# Patient Record
Sex: Female | Born: 1935
Health system: Southern US, Community
[De-identification: ages and names within clinical notes are randomized; demographics above are authoritative.]

## PROBLEM LIST (undated history)

## (undated) DIAGNOSIS — E669 Obesity, unspecified: Secondary | ICD-10-CM

## (undated) DIAGNOSIS — Z780 Asymptomatic menopausal state: Secondary | ICD-10-CM

## (undated) DIAGNOSIS — E785 Hyperlipidemia, unspecified: Secondary | ICD-10-CM

## (undated) DIAGNOSIS — K449 Diaphragmatic hernia without obstruction or gangrene: Secondary | ICD-10-CM

## (undated) DIAGNOSIS — I639 Cerebral infarction, unspecified: Secondary | ICD-10-CM

## (undated) DIAGNOSIS — M1990 Unspecified osteoarthritis, unspecified site: Secondary | ICD-10-CM

## (undated) DIAGNOSIS — T7840XA Allergy, unspecified, initial encounter: Secondary | ICD-10-CM

## (undated) DIAGNOSIS — I82409 Acute embolism and thrombosis of unspecified deep veins of unspecified lower extremity: Secondary | ICD-10-CM

## (undated) DIAGNOSIS — H269 Unspecified cataract: Secondary | ICD-10-CM

## (undated) DIAGNOSIS — E010 Iodine-deficiency related diffuse (endemic) goiter: Secondary | ICD-10-CM

## (undated) DIAGNOSIS — I10 Essential (primary) hypertension: Secondary | ICD-10-CM

## (undated) HISTORY — DX: Cerebral infarction, unspecified: I63.9

## (undated) HISTORY — DX: Unspecified osteoarthritis, unspecified site: M19.90

## (undated) HISTORY — PX: EYE SURGERY: SHX253

## (undated) HISTORY — DX: Unspecified cataract: H26.9

## (undated) HISTORY — DX: Obesity, unspecified: E66.9

## (undated) HISTORY — DX: Acute embolism and thrombosis of unspecified deep veins of unspecified lower extremity: I82.409

## (undated) HISTORY — PX: SHOULDER SURGERY: SHX246

## (undated) HISTORY — PX: ABDOMINAL HYSTERECTOMY: SHX81

## (undated) HISTORY — DX: Diaphragmatic hernia without obstruction or gangrene: K44.9

## (undated) HISTORY — DX: Asymptomatic menopausal state: Z78.0

## (undated) HISTORY — DX: Allergy, unspecified, initial encounter: T78.40XA

## (undated) HISTORY — DX: Hyperlipidemia, unspecified: E78.5

## (undated) HISTORY — DX: Iodine-deficiency related diffuse (endemic) goiter: E01.0

## (undated) HISTORY — PX: OTHER SURGICAL HISTORY: SHX169

## (undated) HISTORY — DX: Essential (primary) hypertension: I10

---

## 1995-04-08 DIAGNOSIS — N390 Urinary tract infection, site not specified: Secondary | ICD-10-CM | POA: Insufficient documentation

## 1998-02-05 DIAGNOSIS — M6740 Ganglion, unspecified site: Secondary | ICD-10-CM | POA: Insufficient documentation

## 2000-01-10 ENCOUNTER — Encounter: Admission: RE | Admit: 2000-01-10 | Discharge: 2000-01-10 | Payer: Self-pay | Admitting: Family Medicine

## 2000-01-10 ENCOUNTER — Encounter: Payer: Self-pay | Admitting: Family Medicine

## 2000-06-07 ENCOUNTER — Encounter: Admission: RE | Admit: 2000-06-07 | Discharge: 2000-06-07 | Payer: Self-pay | Admitting: Family Medicine

## 2000-06-07 ENCOUNTER — Encounter: Payer: Self-pay | Admitting: Family Medicine

## 2001-12-29 ENCOUNTER — Encounter: Admission: RE | Admit: 2001-12-29 | Discharge: 2001-12-29 | Payer: Self-pay | Admitting: Family Medicine

## 2001-12-29 ENCOUNTER — Encounter: Payer: Self-pay | Admitting: Family Medicine

## 2002-07-18 ENCOUNTER — Emergency Department (HOSPITAL_COMMUNITY): Admission: EM | Admit: 2002-07-18 | Discharge: 2002-07-18 | Payer: Self-pay | Admitting: Emergency Medicine

## 2002-07-18 ENCOUNTER — Encounter: Payer: Self-pay | Admitting: Emergency Medicine

## 2002-10-08 HISTORY — PX: COLONOSCOPY: SHX174

## 2003-03-23 ENCOUNTER — Encounter: Payer: Self-pay | Admitting: Family Medicine

## 2003-03-23 ENCOUNTER — Encounter: Admission: RE | Admit: 2003-03-23 | Discharge: 2003-03-23 | Payer: Self-pay | Admitting: Family Medicine

## 2003-07-12 ENCOUNTER — Ambulatory Visit (HOSPITAL_COMMUNITY): Admission: RE | Admit: 2003-07-12 | Discharge: 2003-07-12 | Payer: Self-pay | Admitting: Gastroenterology

## 2004-02-20 ENCOUNTER — Emergency Department (HOSPITAL_COMMUNITY): Admission: EM | Admit: 2004-02-20 | Discharge: 2004-02-20 | Payer: Self-pay | Admitting: Internal Medicine

## 2004-03-21 ENCOUNTER — Emergency Department (HOSPITAL_COMMUNITY): Admission: EM | Admit: 2004-03-21 | Discharge: 2004-03-21 | Payer: Self-pay | Admitting: Family Medicine

## 2004-04-14 ENCOUNTER — Encounter: Admission: RE | Admit: 2004-04-14 | Discharge: 2004-04-14 | Payer: Self-pay | Admitting: Family Medicine

## 2005-02-05 ENCOUNTER — Encounter: Admission: RE | Admit: 2005-02-05 | Discharge: 2005-05-06 | Payer: Self-pay | Admitting: Family Medicine

## 2005-04-23 ENCOUNTER — Encounter: Admission: RE | Admit: 2005-04-23 | Discharge: 2005-04-23 | Payer: Self-pay | Admitting: Family Medicine

## 2006-03-20 ENCOUNTER — Ambulatory Visit (HOSPITAL_COMMUNITY): Admission: RE | Admit: 2006-03-20 | Discharge: 2006-03-20 | Payer: Self-pay | Admitting: Orthopedic Surgery

## 2006-05-10 ENCOUNTER — Encounter: Admission: RE | Admit: 2006-05-10 | Discharge: 2006-05-10 | Payer: Self-pay | Admitting: Family Medicine

## 2006-05-13 ENCOUNTER — Ambulatory Visit: Payer: Self-pay | Admitting: Family Medicine

## 2006-07-29 ENCOUNTER — Ambulatory Visit: Payer: Self-pay | Admitting: Family Medicine

## 2006-09-10 ENCOUNTER — Ambulatory Visit: Payer: Self-pay | Admitting: Family Medicine

## 2006-12-04 ENCOUNTER — Ambulatory Visit: Payer: Self-pay | Admitting: Family Medicine

## 2006-12-11 ENCOUNTER — Ambulatory Visit: Payer: Self-pay | Admitting: Family Medicine

## 2007-05-13 ENCOUNTER — Ambulatory Visit: Payer: Self-pay | Admitting: Family Medicine

## 2007-05-16 ENCOUNTER — Encounter: Admission: RE | Admit: 2007-05-16 | Discharge: 2007-05-16 | Payer: Self-pay | Admitting: Family Medicine

## 2007-05-28 ENCOUNTER — Ambulatory Visit: Payer: Self-pay | Admitting: Family Medicine

## 2007-09-16 ENCOUNTER — Ambulatory Visit: Payer: Self-pay | Admitting: Family Medicine

## 2007-11-24 ENCOUNTER — Ambulatory Visit: Payer: Self-pay | Admitting: Family Medicine

## 2008-01-22 ENCOUNTER — Ambulatory Visit: Payer: Self-pay | Admitting: Family Medicine

## 2008-01-23 ENCOUNTER — Encounter: Admission: RE | Admit: 2008-01-23 | Discharge: 2008-01-23 | Payer: Self-pay | Admitting: Family Medicine

## 2008-05-17 ENCOUNTER — Encounter: Admission: RE | Admit: 2008-05-17 | Discharge: 2008-05-17 | Payer: Self-pay | Admitting: Family Medicine

## 2008-05-28 ENCOUNTER — Ambulatory Visit: Payer: Self-pay | Admitting: Family Medicine

## 2008-06-15 ENCOUNTER — Ambulatory Visit: Payer: Self-pay | Admitting: Family Medicine

## 2008-07-07 ENCOUNTER — Ambulatory Visit: Payer: Self-pay | Admitting: Family Medicine

## 2009-01-10 ENCOUNTER — Ambulatory Visit: Payer: Self-pay | Admitting: Family Medicine

## 2009-01-20 ENCOUNTER — Ambulatory Visit: Payer: Self-pay | Admitting: Family Medicine

## 2009-02-03 ENCOUNTER — Ambulatory Visit: Payer: Self-pay | Admitting: Family Medicine

## 2009-02-08 ENCOUNTER — Ambulatory Visit: Payer: Self-pay | Admitting: Family Medicine

## 2009-02-11 ENCOUNTER — Ambulatory Visit: Payer: Self-pay | Admitting: Vascular Surgery

## 2009-03-25 ENCOUNTER — Encounter: Admission: RE | Admit: 2009-03-25 | Discharge: 2009-03-25 | Payer: Self-pay | Admitting: Family Medicine

## 2009-03-25 ENCOUNTER — Ambulatory Visit: Payer: Self-pay | Admitting: Family Medicine

## 2009-03-29 ENCOUNTER — Ambulatory Visit: Payer: Self-pay | Admitting: Family Medicine

## 2009-04-02 ENCOUNTER — Encounter: Admission: RE | Admit: 2009-04-02 | Discharge: 2009-04-02 | Payer: Self-pay | Admitting: Family Medicine

## 2009-05-19 ENCOUNTER — Encounter: Admission: RE | Admit: 2009-05-19 | Discharge: 2009-05-19 | Payer: Self-pay | Admitting: Family Medicine

## 2009-05-19 LAB — HM MAMMOGRAPHY: HM Mammogram: NEGATIVE

## 2009-06-29 ENCOUNTER — Ambulatory Visit: Payer: Self-pay | Admitting: Family Medicine

## 2009-08-09 ENCOUNTER — Ambulatory Visit: Payer: Self-pay | Admitting: Family Medicine

## 2009-11-19 ENCOUNTER — Emergency Department (HOSPITAL_COMMUNITY): Admission: EM | Admit: 2009-11-19 | Discharge: 2009-11-19 | Payer: Self-pay | Admitting: Family Medicine

## 2010-01-20 ENCOUNTER — Ambulatory Visit: Payer: Self-pay | Admitting: Family Medicine

## 2010-04-25 ENCOUNTER — Ambulatory Visit: Payer: Self-pay | Admitting: Family Medicine

## 2010-05-24 ENCOUNTER — Encounter: Admission: RE | Admit: 2010-05-24 | Discharge: 2010-05-24 | Payer: Self-pay | Admitting: Family Medicine

## 2010-07-05 ENCOUNTER — Ambulatory Visit: Payer: Self-pay | Admitting: Family Medicine

## 2011-01-24 ENCOUNTER — Other Ambulatory Visit: Payer: Self-pay | Admitting: Family Medicine

## 2011-01-24 ENCOUNTER — Ambulatory Visit (INDEPENDENT_AMBULATORY_CARE_PROVIDER_SITE_OTHER): Payer: BC Managed Care – PPO | Admitting: Family Medicine

## 2011-01-24 ENCOUNTER — Ambulatory Visit
Admission: RE | Admit: 2011-01-24 | Discharge: 2011-01-24 | Disposition: A | Discharge status: Home / Self Care | Payer: BC Managed Care – PPO | Source: Ambulatory Visit | Attending: Family Medicine | Admitting: Family Medicine

## 2011-01-24 DIAGNOSIS — R52 Pain, unspecified: Secondary | ICD-10-CM

## 2011-01-24 DIAGNOSIS — M25559 Pain in unspecified hip: Secondary | ICD-10-CM

## 2011-02-10 ENCOUNTER — Encounter: Payer: Self-pay | Admitting: Family Medicine

## 2011-02-10 DIAGNOSIS — M940 Chondrocostal junction syndrome [Tietze]: Secondary | ICD-10-CM

## 2011-02-10 DIAGNOSIS — G5602 Carpal tunnel syndrome, left upper limb: Secondary | ICD-10-CM

## 2011-02-10 DIAGNOSIS — M79606 Pain in leg, unspecified: Secondary | ICD-10-CM

## 2011-02-10 DIAGNOSIS — K529 Noninfective gastroenteritis and colitis, unspecified: Secondary | ICD-10-CM

## 2011-02-10 DIAGNOSIS — M6740 Ganglion, unspecified site: Secondary | ICD-10-CM

## 2011-02-10 DIAGNOSIS — N390 Urinary tract infection, site not specified: Secondary | ICD-10-CM

## 2011-02-10 DIAGNOSIS — R42 Dizziness and giddiness: Secondary | ICD-10-CM

## 2011-02-10 DIAGNOSIS — J40 Bronchitis, not specified as acute or chronic: Secondary | ICD-10-CM

## 2011-02-10 DIAGNOSIS — J329 Chronic sinusitis, unspecified: Secondary | ICD-10-CM

## 2011-02-23 NOTE — Op Note (Signed)
Phyllis Campbell, Phyllis Campbell               ACCOUNT NO.:  [id number]   MEDICAL RECORD NO.:  [id number]          PATIENT TYPE:  AMB   LOCATION:  DAY                          FACILITY:  [facility]   PHYSICIAN:  Marlowe Kays, M.D.  DATE OF BIRTH:  03-09-1932   DATE OF PROCEDURE:  DATE OF DISCHARGE:                                 OPERATIVE REPORT   PREOPERATIVE DIAGNOSES:  1.  Chronic impingement syndrome with rotator cuff tendinopathy, right      shoulder.  2.  Glenohumeral arthritis.  3.  Small full-thickness rotator cuff tear.   POSTOPERATIVE DIAGNOSES:  1.  Chronic impingement syndrome with rotator cuff and biceps tendinopathy      and labral disruption.  2.  Osteoarthritis humeral head.  3.  Chronic impingement syndrome with no definite full-thickness rotator      cuff tear identified.   OPERATION:  Right shoulder arthroscopy with;  1.  Debridement of labrum, biceps tendon rotator cuff.  2.  Arthroscopic subacromial decompression with evaluation of the rotator      cuff.   ASSISTANT:  Mr. Adrian Blackwater.   ANESTHESIA:  General.   INDICATION FOR PROCEDURE:  She has had a long history of persistent right  shoulder pain and a MRI on 12/13/2004, over a year ago, demonstrated the  glenohumeral arthritic changes, the bursitis and what was felt to be a small  full-thickness non-retracted tear of the anterior distal supraspinous  tendon.  She is also felt to have biceps and labrum intact.  She has had no  changes in her shoulder pain since March 2006.  She is advised  preoperatively that she might need a mini incision depending on the severity  of the rotator cuff damage in particularly since it has been over a year  since her MRI.   PROCEDURE:  Satisfactory general anesthesia, beach-chair position on the  Allen frame, the right shoulder was prepped with DuraPrep, draped in a  sterile field.  Anatomy of the shoulder joint was marked out and lateral and  posterior portals were infiltrated  with half percent Marcaine with adrenalin  as was the subacromial space.  Through posterior soft spot portal  atraumatically entered the glenohumeral joint.  Findings were that she had  several areas of full-thickness wear of the humeral head, labral  degeneration and fairly significant fraying of a portion of the biceps  tendon and the undersurface of the rotator cuff.  Because of this I advanced  the scope between the biceps tendon and subscapularis, used a switching  stick, made an anterior incision and over this switching stick placed a  metal cannula followed by a 4.2 shaver debrided down the labrum, biceps  tendon and the underneath surface of the rotator cuff with pre and post  films being taken.  I then redirected the scope in the subacromial space.  In keeping with MRI she did have a significant bursitis present which I  cleaned up which allowed me then to use the ArthroCare 90 degree vaporizer  to remove soft tissue from the underneath surface of the acromion.  She had  some  fairly significant spurring anteriorly.  I followed this with the 4 mm  oval bur, burring down the subacromial bone and then going back and forth  between the bur, the vaporizer and the 4.2 shaver until all bursal tissue  had been removed, I smoothed down the rotator cuff and the impingement  problem had been corrected.  This was documented with films with her arms to  the side and arm abducted.  I then probed the rotator cuff while Mr.  Underwood rotated the arm and we could find no areas of any significant  bursal surface disruption of the rotator cuff.  In short, I saw no evidence  for anything that needed to have open repair.  All fluid was then removed  from the subacromial space.  I infiltrated the 3 portals in the subacromial  space once again with half percent Marcaine with adrenalin and the 3 portals  were closed with 4-0 nylon followed by Betadine, sterile dressing, shoulder  immobilizer.  At the  time of this dictation she was on her way to the  recovery room in satisfactory condition with no known complications.           ______________________________  Marlowe Kays, M.D.     JA/MEDQ  D:  03/20/2006  T:  03/20/2006  Job:  [id number]

## 2011-02-23 NOTE — Op Note (Signed)
   Phyllis Campbell, Phyllis Campbell                         ACCOUNT NO.:  [id number]   MEDICAL RECORD NO.:  [id number]                   PATIENT TYPE:  AMB   LOCATION:  ENDO                                 FACILITY:  [facility]   PHYSICIAN:  James L. Malon Kindle., M.D.          DATE OF BIRTH:  [birth year]   DATE OF PROCEDURE:  07/12/2003  DATE OF DISCHARGE:                                 OPERATIVE REPORT   PROCEDURE:  Colonoscopy.   MEDICATIONS:  Fentanyl 75 mcg, Versed 6 mg IV.   ENDOSCOPIST:  Olympus pediatric colonoscope.   INDICATIONS:  Screening colonoscopy.   DESCRIPTION OF PROCEDURE:  The procedure had been explained to the patient  and consent obtained.  With the patient in the left lateral decubitus  position, the Olympus adjustable pediatric scope was inserted and advanced  under direct visualization.  The prep was excellent, and we were able to  reach the cecum without difficulty.  The ileocecal valve and appendiceal  orifice were seen.  The scope was withdrawn and the cecum, ascending colon,  transverse colon, descending and sigmoid colon were seen well.  No polyps  were seen throughout.  The sigmoid and rectum were also free of polyps.  The  scope was withdrawn.  The patient tolerated the procedure well.   ASSESSMENT:  Normal screening colonoscopy.   PLAN:  Routine postcolonoscopy precautions.  Will recommend yearly  Hemoccults, consider another screening test in five to 10 years.                                               James L. Malon Kindle., M.D.    Waldron Session  D:  07/12/2003  T:  07/12/2003  Job:  [id number]   cc:   Sharlot Gowda, M.D.  [address] [place], [place] [address]  Fax: [phone]

## 2011-04-23 ENCOUNTER — Other Ambulatory Visit: Payer: Self-pay | Admitting: Family Medicine

## 2011-04-23 DIAGNOSIS — Z1231 Encounter for screening mammogram for malignant neoplasm of breast: Secondary | ICD-10-CM

## 2011-05-28 ENCOUNTER — Ambulatory Visit
Admission: RE | Admit: 2011-05-28 | Discharge: 2011-05-28 | Disposition: A | Discharge status: Home / Self Care | Payer: BC Managed Care – PPO | Source: Ambulatory Visit | Attending: Family Medicine | Admitting: Family Medicine

## 2011-05-28 DIAGNOSIS — Z1231 Encounter for screening mammogram for malignant neoplasm of breast: Secondary | ICD-10-CM

## 2011-06-05 ENCOUNTER — Other Ambulatory Visit: Payer: Self-pay | Admitting: Family Medicine

## 2011-07-09 ENCOUNTER — Ambulatory Visit (INDEPENDENT_AMBULATORY_CARE_PROVIDER_SITE_OTHER): Payer: Medicare PPO | Admitting: Family Medicine

## 2011-07-09 ENCOUNTER — Encounter: Payer: Self-pay | Admitting: Family Medicine

## 2011-07-09 VITALS — BP 130/80 | HR 70 | Ht 64.0 in | Wt 198.0 lb

## 2011-07-09 DIAGNOSIS — I10 Essential (primary) hypertension: Secondary | ICD-10-CM

## 2011-07-09 DIAGNOSIS — E1159 Type 2 diabetes mellitus with other circulatory complications: Secondary | ICD-10-CM | POA: Insufficient documentation

## 2011-07-09 DIAGNOSIS — M129 Arthropathy, unspecified: Secondary | ICD-10-CM

## 2011-07-09 DIAGNOSIS — E118 Type 2 diabetes mellitus with unspecified complications: Secondary | ICD-10-CM | POA: Insufficient documentation

## 2011-07-09 DIAGNOSIS — Z79899 Other long term (current) drug therapy: Secondary | ICD-10-CM

## 2011-07-09 DIAGNOSIS — M1990 Unspecified osteoarthritis, unspecified site: Secondary | ICD-10-CM

## 2011-07-09 DIAGNOSIS — Z23 Encounter for immunization: Secondary | ICD-10-CM

## 2011-07-09 DIAGNOSIS — K5900 Constipation, unspecified: Secondary | ICD-10-CM

## 2011-07-09 DIAGNOSIS — E119 Type 2 diabetes mellitus without complications: Secondary | ICD-10-CM | POA: Insufficient documentation

## 2011-07-09 DIAGNOSIS — E785 Hyperlipidemia, unspecified: Secondary | ICD-10-CM | POA: Insufficient documentation

## 2011-07-09 DIAGNOSIS — J301 Allergic rhinitis due to pollen: Secondary | ICD-10-CM | POA: Insufficient documentation

## 2011-07-09 LAB — CBC WITH DIFFERENTIAL/PLATELET
Eosinophils Absolute: 0 10*3/uL (ref 0.0–0.7)
HCT: 39 % (ref 36.0–46.0)
Hemoglobin: 13.4 g/dL (ref 12.0–15.0)
MCH: 31.2 pg (ref 26.0–34.0)
MCHC: 34.4 g/dL (ref 30.0–36.0)
MCV: 90.7 fL (ref 78.0–100.0)
Monocytes Absolute: 0.3 10*3/uL (ref 0.1–1.0)
Neutro Abs: 1.9 10*3/uL (ref 1.7–7.7)
Platelets: 213 10*3/uL (ref 150–400)
RBC: 4.3 MIL/uL (ref 3.87–5.11)
WBC: 4.3 10*3/uL (ref 4.0–10.5)

## 2011-07-09 LAB — COMPREHENSIVE METABOLIC PANEL
ALT: 14 U/L (ref 0–35)
AST: 23 U/L (ref 0–37)
Alkaline Phosphatase: 75 U/L (ref 39–117)
BUN: 13 mg/dL (ref 6–23)
Calcium: 10 mg/dL (ref 8.4–10.5)
Total Bilirubin: 0.6 mg/dL (ref 0.3–1.2)
Total Protein: 7.3 g/dL (ref 6.0–8.3)

## 2011-07-09 LAB — LIPID PANEL
Cholesterol: 134 mg/dL (ref 0–200)
HDL: 56 mg/dL (ref >39)
LDL Cholesterol: 67 mg/dL (ref 0–99)
Triglycerides: 54 mg/dL (ref <150)

## 2011-07-09 NOTE — Patient Instructions (Signed)
Keep using the green tea to help with the constipation

## 2011-07-09 NOTE — Progress Notes (Signed)
  Subjective:    Patient ID: Phyllis Campbell, female    DOB: [birth year], 77 y.o.   MRN: [id number]  HPI She is here for an interval evaluation. Her allergies are under good control and she has very little difficulty with them. She continues on her blood pressure medicine and again having no trouble with this. She does have a history of diabetes however review of records indicates no need for meds at the present time. Her main concern today is difficulty with constipation and she is handling was using green tea which helps keep her regular. She has no other concerns or complaints. 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   Review of Systems  Constitutional: Negative.   HENT: Negative.   Eyes: Negative.   Respiratory: Negative.   Cardiovascular: Negative.   Gastrointestinal: Negative.   Genitourinary: Negative.   Musculoskeletal: Negative.   Skin: Negative.   Neurological: Negative.        Objective:   Physical Exam BP 130/80  Pulse 70  Ht 5\' 4"  (1.626 m)  Wt 198 lb (89.812 kg)  BMI 33.99 kg/m2  General Appearance:    Alert, cooperative, no distress, appears stated age  Head:    Normocephalic, without obvious abnormality, atraumatic  Eyes:    PERRL, conjunctiva/corneas clear, EOM's intact, fundi    benign  Ears:    Normal TM's and external ear canals  Nose:   Nares normal, mucosa normal, no drainage or sinus   tenderness  Throat:   Lips, mucosa, and tongue normal; teeth and gums normal  Neck:   Supple, no lymphadenopathy;  thyroid:  no   enlargement/tenderness/nodules; no carotid   bruit or JVD  Back:    Spine nontender, no curvature, ROM normal, no CVA     tenderness  Lungs:     Clear to auscultation bilaterally without wheezes, rales or     ronchi; respirations unlabored  Chest Wall:    No tenderness or deformity   Heart:    Regular rate and rhythm, S1 and S2 normal, no murmur, rub   or gallop  Breast Exam:     Deferred to GYN  Abdomen:     Soft, non-tender, nondistended, normoactive bowel sounds,    no masses, no hepatosplenomegaly  Genitalia:    Deferred to GYN     Extremities:   No clubbing, cyanosis or edema  Pulses:   2+ and symmetric all extremities  Skin:   Skin color, texture, turgor normal, no rashes or lesions  Lymph nodes:   Cervical, supraclavicular, and axillary nodes normal  Neurologic:   CNII-XII intact, normal strength, sensation and gait; reflexes 2+ and symmetric throughout          Psych:   Normal mood, affect, hygiene and grooming.           Assessment & Plan:   1. Hypertension  CBC with Differential, Comprehensive metabolic panel, Lipid panel  2. Allergic rhinitis due to pollen    3. Diabetes mellitus  Comprehensive metabolic panel, Lipid panel, POCT HgB A1C  4. Hyperlipidemia LDL goal <70  Lipid panel  5. Arthritis    6. Constipation    7. Encounter for long-term (current) use of other medications  CBC with Differential, Comprehensive metabolic panel, Lipid panel

## 2011-08-06 ENCOUNTER — Other Ambulatory Visit: Payer: Self-pay | Admitting: Family Medicine

## 2011-08-09 ENCOUNTER — Other Ambulatory Visit: Payer: Self-pay | Admitting: Family Medicine

## 2011-12-03 ENCOUNTER — Telehealth: Payer: Self-pay | Admitting: Internal Medicine

## 2011-12-03 MED ORDER — SIMVASTATIN 40 MG PO TABS: 40.0000 mg | ORAL_TABLET | Freq: Every day | ORAL | Status: DC

## 2011-12-03 NOTE — Telephone Encounter (Signed)
Sent med in 

## 2011-12-14 ENCOUNTER — Encounter: Payer: Self-pay | Admitting: Family Medicine

## 2011-12-14 ENCOUNTER — Ambulatory Visit (INDEPENDENT_AMBULATORY_CARE_PROVIDER_SITE_OTHER): Payer: Medicare Other | Admitting: Family Medicine

## 2011-12-14 VITALS — BP 124/78 | HR 70 | Wt 176.0 lb

## 2011-12-14 DIAGNOSIS — E119 Type 2 diabetes mellitus without complications: Secondary | ICD-10-CM

## 2011-12-14 LAB — POCT GLYCOSYLATED HEMOGLOBIN (HGB A1C): Hemoglobin A1C: 6.1

## 2011-12-14 MED ORDER — LOSARTAN POTASSIUM-HCTZ 50-12.5 MG PO TABS: 1.0000 | ORAL_TABLET | Freq: Every day | ORAL | Status: DC

## 2011-12-14 NOTE — Progress Notes (Signed)
  Subjective:    Patient ID: Phyllis Campbell, female    DOB: [birth year], 78 y.o.   MRN: [id number]  HPI She is here for a recheck visit. She does have difficulty with arthritis interfering with her quality to walk long distances. She did have a Doppler study done on her extremities and a recent past which was negative Her allergies seem to be under good control. She continues on other medications listed in the chart. She states her blood sugars are doing quite well with numbers running in the low 100s. She does complain of some discomfort in her toes but usually over the distal dorsal surface.   Review of Systems     Objective:   Physical Exam Alert and in no distress. Hemoglobin A1c 6.1 exam of her feet shows no discoloration or other deformities. Pulses are one plus. Reflexes are diminished..       Assessment & Plan:   1. Type II or unspecified type diabetes mellitus without mention of complication, not stated as uncontrolled  POCT HgB A1C  Arthritis. Hypertension. Allergic rhinitis. Obesity she is to continue on her present medication regimen. She is to continue on her present medication regimen and follow up here in several months.

## 2011-12-25 ENCOUNTER — Encounter (HOSPITAL_COMMUNITY): Payer: Self-pay

## 2011-12-25 ENCOUNTER — Emergency Department (HOSPITAL_COMMUNITY): Payer: Medicare Other

## 2011-12-25 ENCOUNTER — Other Ambulatory Visit: Payer: Self-pay

## 2011-12-25 ENCOUNTER — Emergency Department (HOSPITAL_COMMUNITY)
Admission: EM | Admit: 2011-12-25 | Discharge: 2011-12-26 | Disposition: A | Discharge status: Home / Self Care | Payer: Medicare Other | Attending: Emergency Medicine | Admitting: Emergency Medicine

## 2011-12-25 DIAGNOSIS — R42 Dizziness and giddiness: Secondary | ICD-10-CM | POA: Insufficient documentation

## 2011-12-25 DIAGNOSIS — E669 Obesity, unspecified: Secondary | ICD-10-CM | POA: Insufficient documentation

## 2011-12-25 DIAGNOSIS — I10 Essential (primary) hypertension: Secondary | ICD-10-CM | POA: Insufficient documentation

## 2011-12-25 DIAGNOSIS — E119 Type 2 diabetes mellitus without complications: Secondary | ICD-10-CM | POA: Insufficient documentation

## 2011-12-25 DIAGNOSIS — E785 Hyperlipidemia, unspecified: Secondary | ICD-10-CM | POA: Insufficient documentation

## 2011-12-25 LAB — DIFFERENTIAL
Basophils Absolute: 0 10*3/uL (ref 0.0–0.1)
Basophils Relative: 0 % (ref 0–1)
Eosinophils Absolute: 0 10*3/uL (ref 0.0–0.7)
Eosinophils Relative: 1 % (ref 0–5)
Lymphs Abs: 2.2 10*3/uL (ref 0.7–4.0)
Neutrophils Relative %: 55 % (ref 43–77)

## 2011-12-25 LAB — CBC
MCH: 31.1 pg (ref 26.0–34.0)
MCHC: 34.7 g/dL (ref 30.0–36.0)
MCV: 89.8 fL (ref 78.0–100.0)
Platelets: 200 10*3/uL (ref 150–400)
RDW: 13.7 % (ref 11.5–15.5)

## 2011-12-25 LAB — GLUCOSE, CAPILLARY: Glucose-Capillary: 125 mg/dL — ABNORMAL HIGH (ref 70–99)

## 2011-12-25 LAB — COMPREHENSIVE METABOLIC PANEL
ALT: 17 U/L (ref 0–35)
Albumin: 4.1 g/dL (ref 3.5–5.2)
Calcium: 10 mg/dL (ref 8.4–10.5)
GFR calc Af Amer: >90 mL/min (ref >90)
Glucose, Bld: 98 mg/dL (ref 70–99)
Sodium: 137 mEq/L (ref 135–145)
Total Protein: 7.5 g/dL (ref 6.0–8.3)

## 2011-12-25 MED ORDER — MECLIZINE HCL 12.5 MG PO TABS: 25.0000 mg | ORAL_TABLET | Freq: Three times a day (TID) | ORAL | Status: AC | PRN

## 2011-12-25 MED ORDER — MECLIZINE HCL 25 MG PO TABS
12.5000 mg | ORAL_TABLET | Freq: Once | ORAL | Status: AC
  Administered 2011-12-25: 12.5 mg via ORAL
  Filled 2011-12-25: qty 1

## 2011-12-25 NOTE — ED Notes (Signed)
Pt. Ambulated to restroom with minimal assistance and steady gait. Pt. Returned to bed, rails in place. Positioned for comfort. Family members given coffee per request.

## 2011-12-25 NOTE — ED Notes (Signed)
Pt. Having  Dizziness, nauseated and her ears are clogged.  These symptoms  Started this afternoon.  Pt. Denies any pain but does have dizziness

## 2011-12-25 NOTE — Discharge Instructions (Signed)
1) Your head CT scan, EKG, and labs did not show signs of any acute problems. 2) Please take the Meclizine, medication to help with dizziness, three times a day for the next week. 3) No work for the next week. 4) Please make a follow up with Dr. Susann Givens in 5-7 days to get rechecked. 5) If you develop weakness, numbness, trouble speaking, talking, chest pain, shortness of breath, or any other worrisome symptoms, please return to the ER immediately.  Dizziness Dizziness is a common problem. It is a feeling of unsteadiness or lightheadedness. You may feel like you are about to faint. Dizziness can lead to injury if you stumble or fall. A person of any age group can suffer from dizziness, but dizziness is more common in older adults. CAUSES  Dizziness can be caused by many different things, including:  Middle ear problems.   Standing for too long.   Infections.   An allergic reaction.   Aging.   An emotional response to something, such as the sight of blood.   Side effects of medicines.   Fatigue.   Problems with circulation or blood pressure.   Excess use of alcohol, medicines, or illegal drug use.   Breathing too fast (hyperventilation).   An arrhythmia or problems with your heart rhythm.   Low red blood cell count (anemia).   Pregnancy.   Vomiting, diarrhea, fever, or other illnesses that cause dehydration.   Diseases or conditions such as Parkinson's disease, high blood pressure (hypertension), diabetes, and thyroid problems.   Exposure to extreme heat.  DIAGNOSIS  To find the cause of your dizziness, your caregiver may do a physical exam, lab tests, radiologic imaging scans, or an electrocardiography test (ECG).  TREATMENT  Treatment of dizziness depends on the cause of your symptoms and can vary greatly. HOME CARE INSTRUCTIONS   Drink enough fluids to keep your urine clear or pale yellow. This is especially important in very hot weather. In the elderly, it is also  important in cold weather.   If your dizziness is caused by medicines, take them exactly as directed. When taking blood pressure medicines, it is especially important to get up slowly.   Rise slowly from chairs and steady yourself until you feel okay.   In the morning, first sit up on the side of the bed. When this seems okay, stand slowly while holding onto something until you know your balance is fine.   If you need to stand in one place for a long time, be sure to move your legs often. Tighten and relax the muscles in your legs while standing.   If dizziness continues to be a problem, have someone stay with you for a day or two. Do this until you feel you are well enough to stay alone. Have the person call your caregiver if he or she notices changes in you that are concerning.   Do not drive or use heavy machinery if you feel dizzy.  SEEK IMMEDIATE MEDICAL CARE IF:   Your dizziness or lightheadedness gets worse.   You feel nauseous or vomit.   You develop problems with talking, walking, weakness, or using your arms, hands, or legs.   You are not thinking clearly or you have difficulty forming sentences. It may take a friend or family member to determine if your thinking is normal.   You develop chest pain, abdominal pain, shortness of breath, or sweating.   Your vision changes.   You notice any bleeding.  You have side effects from medicine that seems to be getting worse rather than better.  MAKE SURE YOU:   Understand these instructions.   Will watch your condition.   Will get help right away if you are not doing well or get worse.  Document Released: 03/20/2001 Document Revised: 09/13/2011 Document Reviewed: 04/13/2011 [facility] Patient Information 2012 [place], [place].

## 2011-12-25 NOTE — ED Provider Notes (Signed)
History     CSN: [id number]  Arrival date & time 12/25/11  1621   First MD Initiated Contact with Patient 12/25/11 2051      Chief Complaint  Patient presents with  . Dizziness    (Consider location/radiation/quality/duration/timing/severity/associated sxs/prior treatment) HPI Hx obtained from pt. 78yo F with hx HTN, prediabetes, HLD presents with dizziness. 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 States she felt as if "the bus was turning around on me." Had an associated feeling of "neck tightness." Denies associated chest pain/pressure, dyspnea, diaphoresis, nausea/vomiting. She states that she was able to drink some water and felt as if her symptoms improved to the point that she was able to continue on her route. Has some residual dizziness at this time. States she has a sensation as if her ears are clogged but denies tinnitus, hearing loss.  Past Medical History  Diagnosis Date  . Hypertension   . Thyromegaly   . Obesity   . HH (hiatus hernia)   . Postmenopausal   . Allergy   . Arthritis   . Diabetes mellitus   . Dyslipidemia   . Cataracts, bilateral     Past Surgical History  Procedure Date  . Abdominal hysterectomy   . Cesarean section   . Colonoscopy 2004  . Cataracts   . Eye surgery     No family history on file.  History  Substance Use Topics  . Smoking status: Never Smoker   . Smokeless tobacco: Never Used  . Alcohol Use: No    OB History    Grav Para Term Preterm Abortions TAB SAB Ect Mult Living                  Review of Systems  Constitutional: Negative for fever, chills, activity change, appetite change, fatigue and unexpected weight change.  HENT: Positive for neck pain. Negative for nosebleeds and congestion.   Eyes: Negative for photophobia and visual disturbance.  Respiratory: Negative for chest tightness and shortness of  breath.   Cardiovascular: Negative for chest pain and palpitations.  Neurological: Positive for dizziness. Negative for weakness and headaches.    Allergies  Review of patient's allergies indicates no known allergies.  Home Medications   Current Outpatient Rx  Name Route Sig Dispense Refill  . ASPIRIN EC 81 MG PO TBEC Oral Take 81 mg by mouth daily.    . IBUPROFEN 200 MG PO TABS Oral Take 200 mg by mouth every 6 (six) hours as needed. For pain    . LOSARTAN POTASSIUM-HCTZ 50-12.5 MG PO TABS Oral Take 1 tablet by mouth daily. 90 tablet 3  . SIMVASTATIN 40 MG PO TABS Oral Take 40 mg by mouth daily.      BP 133/72  Pulse 69  Temp(Src) 98.2 F (36.8 C) (Oral)  Resp 18  Ht 5' 4.25" (1.632 m)  Wt 176 lb (79.833 kg)  BMI 29.98 kg/m2  SpO2 98%  Orthstatic P/BP Pulse Rate: 78 ; BP: 131/76; Patient Position, if appropriate: Sitting Pulse Rate: 78 ; BP: 121/76 ;Patient Position, if appropriate: Standing  Pulse Rate: 77 ; BP: 134/67 ;Patient Position, if appropriate: Lying   Physical Exam  Nursing note and vitals reviewed. Constitutional: She is oriented to person, place, and time. She appears well-developed and well-nourished. No distress.  HENT:  Head: Normocephalic and atraumatic.  Eyes: Conjunctivae and EOM are normal.  Pupils are equal, round, and reactive to light.  Fundoscopic exam:      The right eye shows no arteriolar narrowing, no AV nicking and no hemorrhage.       The left eye shows no arteriolar narrowing, no AV nicking and no hemorrhage.  Neck: Normal range of motion.  Cardiovascular: Normal rate, regular rhythm and normal heart sounds.   Pulmonary/Chest: Effort normal and breath sounds normal.  Abdominal: Soft. Bowel sounds are normal. There is no tenderness.  Musculoskeletal:       Moves all 4 ext equally  Neurological: She is alert and oriented to person, place, and time. No cranial nerve deficit. She exhibits normal muscle tone. Coordination normal. GCS eye  subscore is 4. GCS verbal subscore is 5. GCS motor subscore is 6.  Skin: Skin is warm and dry. No rash noted. She is not diaphoretic.  Psychiatric: She has a normal mood and affect.    ED Course  Procedures (including critical care time)  Labs Reviewed  GLUCOSE, CAPILLARY - Abnormal; Notable for the following:    Glucose-Capillary 125 (*)    All other components within normal limits  COMPREHENSIVE METABOLIC PANEL - Abnormal; Notable for the following:    GFR calc non Af Amer 81 (*)    All other components within normal limits  CBC  DIFFERENTIAL  POCT I-STAT TROPONIN I   Ct Head Wo Contrast  12/25/2011  *RADIOLOGY REPORT*  Clinical Data: Sudden onset dizziness and left side headache  CT HEAD WITHOUT CONTRAST  Technique:  Contiguous axial images were obtained from the base of the skull through the vertex without contrast.  Comparison: None  Findings: Normal ventricular morphology. No midline shift or mass effect. Small vessel chronic ischemic changes of deep cerebral white matter. No intracranial hemorrhage, mass lesion, or evidence of acute infarction. No definite extra-axial fluid collections. Dense calcification along falx. Visualized paranasal sinuses and mastoid air cells clear. No acute osseous findings.  IMPRESSION: Atrophy with small vessel chronic ischemic changes of deep cerebral white matter. No acute intracranial abnormalities.  Original Report Authenticated By: Lollie Marrow, M.D.     1. 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 This has improved slightly but is still present. VSS stable, she is not orthostatic. No focal neuro findings on exam. She was txed with 1/2 dose meclizine here and stated this improved her sx.  Labs largely unremarkable. CT without  evidence of acute infarct, hemorrhage, or other acute process. I personally reviewed the study.   Feel pt stable for dc at this time. Will tx with meclizine at home. Pt given note to stay out of work for 1 week. To f/u with PCP in 5-7 days for a recheck and clearance to return to work. Return precautions discussed.  Plan d/w Dr. Ignacia Palma who discussed plan with pt and family.    Amount and/or Complexity of Data Reviewed Clinical lab tests: ordered and reviewed Tests in the radiology section of CPT: ordered and reviewed Tests in the medicine section of CPT: ordered and reviewed Review and summarize past medical records: yes Discuss the patient with other providers: yes Independent visualization of images, tracings, or specimens: yes  Risk of Complications, Morbidity, and/or Mortality Presenting problems: high Diagnostic procedures: minimal     Medical screening examination/treatment/procedure(s) were  conducted as a shared visit with non-physician practitioner(s) and myself.  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  This was a feeling of spinning.  She got off the road and the feeling subsided.  Now she has mild dizziness with change of position.  Exam shows no nystagmus, non focal neuro exam, Heart and lungs negative.  Lab workup is reassuringly normal.  Advised Antivert 25 mg qid x 1 week, no work for one week, recheck with her PCP prior to returning to work. Osvaldo Human, M.D.        Grant Fontana, [place] 12/26/11 0007  Carleene Cooper III, MD 12/26/11 [phone]

## 2011-12-25 NOTE — ED Provider Notes (Signed)
Date: 12/25/2011  Rate: 72  Rhythm: normal sinus rhythm  QRS Axis: normal  Intervals: normal QRS:  Low voltage in limb leads.  ST/T Wave abnormalities: normal  Conduction Disutrbances:none  Narrative Interpretation: Borderline EKG.  Old EKG Reviewed: none available    Carleene Cooper III, MD 12/25/11 2342

## 2011-12-28 ENCOUNTER — Encounter: Payer: Self-pay | Admitting: Family Medicine

## 2011-12-28 ENCOUNTER — Ambulatory Visit (INDEPENDENT_AMBULATORY_CARE_PROVIDER_SITE_OTHER): Payer: Medicare Other | Admitting: Family Medicine

## 2011-12-28 ENCOUNTER — Telehealth: Payer: Self-pay | Admitting: Family Medicine

## 2011-12-28 VITALS — BP 130/88 | HR 78 | Wt 170.0 lb

## 2011-12-28 DIAGNOSIS — R42 Dizziness and giddiness: Secondary | ICD-10-CM

## 2011-12-28 MED ORDER — DIAZEPAM 2 MG PO TABS: 2.0000 mg | ORAL_TABLET | Freq: Two times a day (BID) | ORAL | Status: AC | PRN

## 2011-12-28 NOTE — Telephone Encounter (Signed)
DONE

## 2011-12-28 NOTE — Progress Notes (Signed)
  Subjective:    Patient ID: Phyllis Campbell, female    DOB: 08-Jul-1932, 78 y.o.   MRN: [id number]  HPI She is here for a followup visit concerning recent trip to the hospital for an episode of dizziness. The emergency room record was reviewed. They did blood work, EKG and CT scan all of which was negative. They placed her on meclizine. She continues to have difficulty with dizziness to a lesser extent. She's had  double vision, palpitations no blurred vision.   Review of Systems     Objective:   Physical Exam alert and in no distress. Tympanic membranes and canals are normal. Throat is clear. Tonsils are normal. Neck is supple without adenopathy or thyromegaly. Cardiac exam shows a regular sinus rhythm without murmurs or gallops. Lungs are clear to auscultation. Slightly unsteady on her feet.       Assessment & Plan:   1. Vertigo  diazepam (VALIUM) 2 MG tablet   she will call me Monday and let me know how she is doing on the low-dose Valium. Discussed the need for her to avoid driving. If no improvement, ENT evaluation will be ordered.

## 2011-12-28 NOTE — Patient Instructions (Signed)
No driving while on this medication. Call me Monday

## 2011-12-31 ENCOUNTER — Telehealth: Payer: Self-pay

## 2011-12-31 ENCOUNTER — Other Ambulatory Visit: Payer: Self-pay | Admitting: Cardiology

## 2011-12-31 DIAGNOSIS — R0989 Other specified symptoms and signs involving the circulatory and respiratory systems: Secondary | ICD-10-CM

## 2011-12-31 NOTE — Telephone Encounter (Signed)
Keep trying to reach her

## 2011-12-31 NOTE — Telephone Encounter (Signed)
Have her continue the present medication regimen and if she gets worse, call for referral

## 2011-12-31 NOTE — Telephone Encounter (Signed)
Pt called back said she is still dizzy some but needs to know what to do about work needs to know before 5

## 2011-12-31 NOTE — Telephone Encounter (Signed)
Pt informed word for word  

## 2011-12-31 NOTE — Telephone Encounter (Signed)
Pt called and said you wanted her to call tried to call her back to find out why but no answer and voice mail is not set up

## 2012-01-01 ENCOUNTER — Encounter (INDEPENDENT_AMBULATORY_CARE_PROVIDER_SITE_OTHER): Payer: Medicare Other

## 2012-01-01 DIAGNOSIS — E1159 Type 2 diabetes mellitus with other circulatory complications: Secondary | ICD-10-CM

## 2012-01-01 DIAGNOSIS — R0989 Other specified symptoms and signs involving the circulatory and respiratory systems: Secondary | ICD-10-CM

## 2012-01-01 DIAGNOSIS — I739 Peripheral vascular disease, unspecified: Secondary | ICD-10-CM

## 2012-03-05 ENCOUNTER — Encounter: Payer: Self-pay | Admitting: Cardiovascular Disease

## 2012-03-05 ENCOUNTER — Ambulatory Visit (INDEPENDENT_AMBULATORY_CARE_PROVIDER_SITE_OTHER): Payer: Medicare Other | Admitting: Cardiovascular Disease

## 2012-03-05 VITALS — BP 135/76 | HR 68 | Ht 64.0 in | Wt 204.0 lb

## 2012-03-05 DIAGNOSIS — I70219 Atherosclerosis of native arteries of extremities with intermittent claudication, unspecified extremity: Secondary | ICD-10-CM

## 2012-03-05 NOTE — Patient Instructions (Signed)
Your physician recommends that you schedule a follow-up appointment as needed with Dr Cooper.    

## 2012-03-05 NOTE — Progress Notes (Signed)
HPI:  78 year old woman referred for evaluation of lower extremity peripheral arterial disease. The patient has complained of pain across the toes of her right foot. This is unrelated to ambulation and pain is there all the time. She does not experience this pain when she wears (shoes. She's able to walk for 30 minutes daily. She does have some limitation from pain in her low back that radiates down both legs.  She underwent lower extremity arterial duplex scanning demonstrating ABIs of 1.2 on the right and 0.84 on the left. There was no significant disease isolated in either leg, but there was possibility of mild aortoiliac disease noted.  The patient is otherwise doing well. She denies chest pain, dyspnea, edema, orthopnea, PND, or syncope. She has never had rest pain or ischemic ulceration of the legs.  Outpatient Encounter Prescriptions as of 03/05/2012  Medication Sig Dispense Refill  . aspirin EC 81 MG tablet Take 81 mg by mouth daily.      . Ginkgo Biloba 40 MG TABS Take 60 mg by mouth as needed.      Marland Kitchen ibuprofen (ADVIL,MOTRIN) 200 MG tablet Take 200 mg by mouth every 6 (six) hours as needed. For pain      . losartan-hydrochlorothiazide (HYZAAR) 50-12.5 MG per tablet Take 1 tablet by mouth daily.  90 tablet  3  . simvastatin (ZOCOR) 40 MG tablet Take 40 mg by mouth daily.        Review of patient's allergies indicates no known allergies.  Past Medical History  Diagnosis Date  . Hypertension   . Thyromegaly   . Obesity   . HH (hiatus hernia)   . Postmenopausal   . Allergy   . Arthritis   . Diabetes mellitus   . Dyslipidemia   . Cataracts, bilateral     Past Surgical History  Procedure Date  . Abdominal hysterectomy   . Cesarean section   . Colonoscopy 2004  . Cataracts   . Eye surgery     History   Social History  . Marital Status: Married    Spouse Name: N/A    Number of Children: N/A  . Years of Education: N/A   Occupational History  . Not on file.    Social History Main Topics  . Smoking status: Never Smoker   . Smokeless tobacco: Never Used  . Alcohol Use: No  . Drug Use: Not on file  . Sexually Active: Not Currently   Other Topics Concern  . Not on file   Social History Narrative  . No narrative on file   Family history: Patient's brother and father both had myocardial infarctions. There is a lot of diabetes in her family.  ROS: General: no fevers/chills/night sweats Eyes: no blurry vision, diplopia, or amaurosis ENT: no sore throat or hearing loss Resp: no cough, wheezing, or hemoptysis CV: no edema or palpitations GI: no abdominal pain, nausea, vomiting, diarrhea. Positive for constipation  GU: no dysuria, frequency, or hematuria Skin: no rash Neuro: no headache, numbness, tingling, or weakness of extremities. History of vertigo but this has resolved Musculoskeletal: Diffuse joint pains are present that the patient relates to osteoarthritis Heme: no bleeding, DVT, or easy bruising Endo: no polydipsia or polyuria  BP 135/76  Pulse 68  Ht 5\' 4"  (1.626 m)  Wt 92.534 kg (204 lb)  BMI 35.02 kg/m2  PHYSICAL EXAM: Pt is alert and oriented, WD, WN, in no distress. HEENT: normal Neck: JVP normal. Carotid upstrokes normal without bruits. No thyromegaly.  Lungs: equal expansion, clear bilaterally CV: Apex is discrete and nondisplaced, RRR without murmur or gallop Abd: soft, NT, +BS, no bruit, no hepatosplenomegaly Back: no CVA tenderness Ext: no C/C/E        Femoral pulses 2+= without bruits        DP/PT pulses intact but diminished at 1+ bilaterally Skin: warm and dry without rash. On the feet. Neuro: CNII-XII intact             Strength intact = bilaterally  ASSESSMENT AND PLAN: 1. Lower extremity peripheral arterial disease. The patient has atypical leg pain, I suspect unrelated to PAD. Her waveforms and ankle pressures show only mild arterial insufficiency. Recommend continued risk reduction measures. The  patient's hypertension and dyslipidemia are both treated appropriately. She is on antiplatelet therapy with aspirin. I don't think her symptoms would be improved with the addition of Pletal or escalation of her medical therapy. I would be happy to see her back on an as-needed basis.  2. Hypertension. Patient's blood pressure is well controlled. She is on an ARB and this is one of the preferred classes of antihypertensives and PAD patient's.  3. Dyslipidemia. The patient is on a statin drug. Her cholesterol is managed by Dr. Susann Givens.  Tonny Bollman 03/13/2012

## 2012-03-13 DIAGNOSIS — I70219 Atherosclerosis of native arteries of extremities with intermittent claudication, unspecified extremity: Secondary | ICD-10-CM | POA: Insufficient documentation

## 2012-05-01 ENCOUNTER — Other Ambulatory Visit: Payer: Self-pay | Admitting: Family Medicine

## 2012-05-01 DIAGNOSIS — Z1231 Encounter for screening mammogram for malignant neoplasm of breast: Secondary | ICD-10-CM

## 2012-05-29 ENCOUNTER — Ambulatory Visit: Payer: Medicare Other

## 2012-06-20 ENCOUNTER — Ambulatory Visit
Admission: RE | Admit: 2012-06-20 | Discharge: 2012-06-20 | Disposition: A | Discharge status: Home / Self Care | Payer: Medicare Other | Source: Ambulatory Visit | Attending: Family Medicine | Admitting: Family Medicine

## 2012-06-20 DIAGNOSIS — Z1231 Encounter for screening mammogram for malignant neoplasm of breast: Secondary | ICD-10-CM

## 2012-06-20 LAB — HM MAMMOGRAPHY: HM Mammogram: NORMAL (ref 0–4)

## 2012-07-13 ENCOUNTER — Other Ambulatory Visit: Payer: Self-pay | Admitting: Family Medicine

## 2012-07-21 ENCOUNTER — Encounter: Payer: Self-pay | Admitting: Internal Medicine

## 2012-07-30 ENCOUNTER — Ambulatory Visit (INDEPENDENT_AMBULATORY_CARE_PROVIDER_SITE_OTHER): Payer: Medicare Other | Admitting: Family Medicine

## 2012-07-30 ENCOUNTER — Encounter: Payer: Self-pay | Admitting: Family Medicine

## 2012-07-30 VITALS — BP 118/80 | HR 77 | Ht 65.0 in | Wt 200.0 lb

## 2012-07-30 DIAGNOSIS — E119 Type 2 diabetes mellitus without complications: Secondary | ICD-10-CM

## 2012-07-30 DIAGNOSIS — M1990 Unspecified osteoarthritis, unspecified site: Secondary | ICD-10-CM

## 2012-07-30 DIAGNOSIS — M129 Arthropathy, unspecified: Secondary | ICD-10-CM

## 2012-07-30 DIAGNOSIS — Z79899 Other long term (current) drug therapy: Secondary | ICD-10-CM

## 2012-07-30 DIAGNOSIS — E785 Hyperlipidemia, unspecified: Secondary | ICD-10-CM

## 2012-07-30 DIAGNOSIS — Z23 Encounter for immunization: Secondary | ICD-10-CM

## 2012-07-30 DIAGNOSIS — I10 Essential (primary) hypertension: Secondary | ICD-10-CM

## 2012-07-30 DIAGNOSIS — I70219 Atherosclerosis of native arteries of extremities with intermittent claudication, unspecified extremity: Secondary | ICD-10-CM

## 2012-07-30 DIAGNOSIS — J301 Allergic rhinitis due to pollen: Secondary | ICD-10-CM

## 2012-07-30 LAB — POCT URINALYSIS DIPSTICK
Blood, UA: NEGATIVE
Glucose, UA: NEGATIVE
Nitrite, UA: NEGATIVE
Urobilinogen, UA: NEGATIVE
pH, UA: 6

## 2012-07-30 LAB — POCT GLYCOSYLATED HEMOGLOBIN (HGB A1C): Hemoglobin A1C: 5.9

## 2012-07-30 MED ORDER — INFLUENZA VIRUS VACC SPLIT PF IM SUSP: 0.5000 mL | Freq: Once | INTRAMUSCULAR | Status: DC

## 2012-07-30 NOTE — Progress Notes (Signed)
  Subjective:    Patient ID: Phyllis Campbell, female    DOB: 11-19-1931, 78 y.o.   MRN: [id number]  HPI She is here for an interval evaluation. She continues to work as a Midwife. Her husband apparently has multiple medical issues including psychological once. She tends to spend as little time at home as possible to avoid him. She continues on medications listed in the chart. She does see her cardiologist regularly. She does have a history of diabetes however her most recent hemoglobin A1c is 5.9. She did have a 9 exam this year. She does take good care of her feet. Her arthritis does give her difficulty especially with walking up and down steps. Her allergies seem to be under good control usually bother her spring and fall.   Review of Systems  Constitutional: Negative.   HENT: Negative.   Eyes: Negative.   Respiratory: Negative.   Genitourinary: Negative.   Musculoskeletal: Positive for arthralgias.       Objective:   Physical Exam BP 118/80  Pulse 77  Ht 5\' 5"  (1.651 m)  Wt 200 lb (90.719 kg)  BMI 33.28 kg/m2  General Appearance:    Alert, cooperative, no distress, appears stated age  Head:    Normocephalic, without obvious abnormality, atraumatic  Eyes:    PERRL, conjunctiva/corneas clear, EOM's intact, fundi    benign  Ears:    Normal TM's and external ear canals  Nose:   Nares normal, mucosa normal, no drainage or sinus   tenderness  Throat:   Lips, mucosa, and tongue normal; teeth and gums normal  Neck:   Supple, no lymphadenopathy;  thyroid:  no   enlargement/tenderness/nodules; no carotid   bruit or JVD  Back:    Spine nontender, no curvature, ROM normal, no CVA     tenderness  Lungs:     Clear to auscultation bilaterally without wheezes, rales or     ronchi; respirations unlabored  Chest Wall:    No tenderness or deformity   Heart:    Regular rate and rhythm, S1 and S2 normal, no murmur, rub   or gallop  Breast Exam:    Deferred to GYN  Abdomen:     Soft,  non-tender, nondistended, normoactive bowel sounds,    no masses, no hepatosplenomegaly  Genitalia:    Deferred to GYN     Extremities:   No clubbing, cyanosis or edema  Pulses:   2+ and symmetric all extremities  Skin:   Skin color, texture, turgor normal, no rashes or lesions  Lymph nodes:   Cervical, supraclavicular, and axillary nodes normal  Neurologic:   CNII-XII intact, normal strength, sensation and gait; reflexes 2+ and symmetric throughout          Psych:   Normal mood, affect, hygiene and grooming.           Assessment & Plan:   1. Diabetes mellitus  POCT glycosylated hemoglobin (Hb A1C), POCT Urinalysis Dipstick  2. Hypertension    3. Need for prophylactic vaccination and inoculation against influenza  influenza  inactive virus vaccine (FLUZONE/FLUARIX) injection 0.5 mL  4. Hyperlipidemia LDL goal <70  Lipid panel  5. Arthritis    6. Allergic rhinitis due to pollen    7. Atherosclerosis of native arteries of the extremities with intermittent claudication    8. Encounter for long-term (current) use of other medications     continue on present medications. Flu shot discussed with risks and benefits.

## 2012-07-30 NOTE — Patient Instructions (Signed)
Keep taking good care of yourself 

## 2012-07-31 LAB — LIPID PANEL: LDL Cholesterol: 60 mg/dL (ref 0–99)

## 2012-07-31 NOTE — Progress Notes (Signed)
Quick Note:  Lipids look good. Continue on present statin dosing ______

## 2012-07-31 NOTE — Progress Notes (Signed)
Quick Note:  Called pt cell she was informed and verbalized understanding ______

## 2013-01-10 ENCOUNTER — Other Ambulatory Visit: Payer: Self-pay | Admitting: Family Medicine

## 2013-02-02 ENCOUNTER — Other Ambulatory Visit: Payer: Self-pay | Admitting: Family Medicine

## 2013-03-16 ENCOUNTER — Encounter: Payer: Self-pay | Admitting: Family Medicine

## 2013-03-16 ENCOUNTER — Ambulatory Visit (INDEPENDENT_AMBULATORY_CARE_PROVIDER_SITE_OTHER): Payer: Medicare PPO | Admitting: Family Medicine

## 2013-03-16 VITALS — BP 122/72 | HR 72 | Wt 196.0 lb

## 2013-03-16 DIAGNOSIS — R42 Dizziness and giddiness: Secondary | ICD-10-CM

## 2013-03-16 MED ORDER — MECLIZINE HCL 12.5 MG PO TABS: 12.5000 mg | ORAL_TABLET | Freq: Three times a day (TID) | ORAL | Status: DC | PRN

## 2013-03-16 NOTE — Progress Notes (Signed)
  Subjective:    Patient ID: Phyllis Campbell, female    DOB: May 22, 1932, 79 y.o.   MRN: [id number]  HPI She is here for consultation. She is getting ready to go on a cruise and does have an underlying history of vertigo. She is afraid that this could get worse.   Review of Systems     Objective:   Physical Exam Alert and in no distress otherwise not examined       Assessment & Plan:  Vertigo - Plan: meclizine (ANTIVERT) 12.5 MG tablet I discussed treatment of vertigo and how to avoid it when she is on a ship. I will give her meclizine and she can use as needed.

## 2013-04-17 ENCOUNTER — Telehealth: Payer: Self-pay | Admitting: Family Medicine

## 2013-04-17 MED ORDER — LOSARTAN POTASSIUM-HCTZ 50-12.5 MG PO TABS: ORAL_TABLET | ORAL | Status: DC

## 2013-04-17 NOTE — Telephone Encounter (Signed)
SENT B/P MED IN 

## 2013-05-25 ENCOUNTER — Telehealth: Payer: Self-pay | Admitting: Internal Medicine

## 2013-05-25 MED ORDER — SIMVASTATIN 40 MG PO TABS: ORAL_TABLET | ORAL | Status: DC

## 2013-05-25 MED ORDER — LOSARTAN POTASSIUM-HCTZ 50-12.5 MG PO TABS: ORAL_TABLET | ORAL | Status: DC

## 2013-05-25 NOTE — Telephone Encounter (Signed)
SENT BOTH MED

## 2013-05-25 NOTE — Telephone Encounter (Signed)
Refill request for simvastatin and losartan-hctz [facility]

## 2013-07-28 ENCOUNTER — Other Ambulatory Visit (INDEPENDENT_AMBULATORY_CARE_PROVIDER_SITE_OTHER): Payer: Medicare PPO

## 2013-07-28 DIAGNOSIS — Z23 Encounter for immunization: Secondary | ICD-10-CM

## 2013-09-28 ENCOUNTER — Other Ambulatory Visit: Payer: Self-pay | Admitting: Family Medicine

## 2013-09-28 ENCOUNTER — Telehealth: Payer: Self-pay | Admitting: Family Medicine

## 2013-09-28 NOTE — Telephone Encounter (Signed)
tsd  °

## 2013-10-19 ENCOUNTER — Encounter: Payer: Self-pay | Admitting: Family Medicine

## 2013-10-19 ENCOUNTER — Ambulatory Visit (INDEPENDENT_AMBULATORY_CARE_PROVIDER_SITE_OTHER): Payer: Medicare PPO | Admitting: Family Medicine

## 2013-10-19 VITALS — BP 118/76 | HR 67 | Wt 201.0 lb

## 2013-10-19 DIAGNOSIS — Z23 Encounter for immunization: Secondary | ICD-10-CM

## 2013-10-19 DIAGNOSIS — J301 Allergic rhinitis due to pollen: Secondary | ICD-10-CM

## 2013-10-19 DIAGNOSIS — M1990 Unspecified osteoarthritis, unspecified site: Secondary | ICD-10-CM

## 2013-10-19 DIAGNOSIS — E119 Type 2 diabetes mellitus without complications: Secondary | ICD-10-CM

## 2013-10-19 DIAGNOSIS — M129 Arthropathy, unspecified: Secondary | ICD-10-CM

## 2013-10-19 DIAGNOSIS — I10 Essential (primary) hypertension: Secondary | ICD-10-CM

## 2013-10-19 DIAGNOSIS — E785 Hyperlipidemia, unspecified: Secondary | ICD-10-CM

## 2013-10-19 LAB — CBC WITH DIFFERENTIAL/PLATELET
BASOS ABS: 0 10*3/uL (ref 0.0–0.1)
BASOS PCT: 0 % (ref 0–1)
EOS ABS: 0 10*3/uL (ref 0.0–0.7)
Eosinophils Relative: 1 % (ref 0–5)
HCT: 38.3 % (ref 36.0–46.0)
Hemoglobin: 13.1 g/dL (ref 12.0–15.0)
Lymphocytes Relative: 45 % (ref 12–46)
Lymphs Abs: 1.9 10*3/uL (ref 0.7–4.0)
MCH: 32 pg (ref 26.0–34.0)
MCHC: 34.2 g/dL (ref 30.0–36.0)
MCV: 93.4 fL (ref 78.0–100.0)
MONOS PCT: 8 % (ref 3–12)
Monocytes Absolute: 0.3 10*3/uL (ref 0.1–1.0)
NEUTROS ABS: 1.9 10*3/uL (ref 1.7–7.7)
NEUTROS PCT: 46 % (ref 43–77)
Platelets: 207 10*3/uL (ref 150–400)
RBC: 4.1 MIL/uL (ref 3.87–5.11)
RDW: 14.5 % (ref 11.5–15.5)
WBC: 4.2 10*3/uL (ref 4.0–10.5)

## 2013-10-19 LAB — POCT GLYCOSYLATED HEMOGLOBIN (HGB A1C): Hemoglobin A1C: 6

## 2013-10-19 LAB — COMPREHENSIVE METABOLIC PANEL
ALBUMIN: 4.3 g/dL (ref 3.5–5.2)
ALK PHOS: 75 U/L (ref 39–117)
ALT: 20 U/L (ref 0–35)
AST: 24 U/L (ref 0–37)
BUN: 13 mg/dL (ref 6–23)
CALCIUM: 10.1 mg/dL (ref 8.4–10.5)
CHLORIDE: 103 meq/L (ref 96–112)
CO2: 28 mEq/L (ref 19–32)
Creat: 0.72 mg/dL (ref 0.50–1.10)
Glucose, Bld: 76 mg/dL (ref 70–99)
POTASSIUM: 3.7 meq/L (ref 3.5–5.3)
SODIUM: 139 meq/L (ref 135–145)
TOTAL PROTEIN: 7.3 g/dL (ref 6.0–8.3)
Total Bilirubin: 0.6 mg/dL (ref 0.3–1.2)

## 2013-10-19 LAB — LIPID PANEL
CHOL/HDL RATIO: 2.1 ratio
CHOLESTEROL: 122 mg/dL (ref 0–200)
HDL: 57 mg/dL (ref >39)
LDL CALC: 53 mg/dL (ref 0–99)
Triglycerides: 61 mg/dL (ref <150)
VLDL: 12 mg/dL (ref 0–40)

## 2013-10-19 MED ORDER — LOSARTAN POTASSIUM-HCTZ 50-12.5 MG PO TABS: ORAL_TABLET | ORAL | Status: DC

## 2013-10-19 NOTE — Progress Notes (Signed)
   Subjective:    Patient ID: Phyllis Campbell, female    DOB: [birth year], 80 y.o.   MRN: [id number]  HPI She is here for routine checkup. She has had some difficulty with myalgias but relates this to cold weather. She does have difficulty taking pain meds for this. Apparently they can cause difficulty with constipation. Also note some difficulty with arthritis but states that when she does good range of motion exercises with her legs, the symptoms did get much better. Her allergies are under good control. She keeps himself quite busy and stays on her present medication regimen. Social history was reviewed. She does get regular eye exams.   Review of Systems     Objective:   Physical Exam alert and in no distress. Tympanic membranes and canals are normal. Throat is clear. Tonsils are normal. Neck is supple without adenopathy or thyromegaly. Cardiac exam shows a regular sinus rhythm without murmurs or gallops. Lungs are clear to auscultation. Hemoglobin A1c is 6.0       Assessment & Plan:  Diabetes mellitus - Plan: Pneumococcal polysaccharide vaccine 23-valent greater than or equal to 2yo subcutaneous/IM, POCT glycosylated hemoglobin (Hb A1C)  Hypertension - Plan: Comprehensive metabolic panel, CBC with Differential  Hyperlipidemia LDL goal <70 - Plan: Lipid panel  Arthritis  Allergic rhinitis due to pollen  encouraged her to continue with her present lifestyle. She seems to be doing pretty good job.

## 2013-10-19 NOTE — Patient Instructions (Signed)
Use the Tylenol for your aches and pains and stay on the Metamucil to keep you regular

## 2013-10-20 NOTE — Progress Notes (Signed)
Pt aware of lab results 

## 2013-11-01 ENCOUNTER — Other Ambulatory Visit: Payer: Self-pay | Admitting: Family Medicine

## 2013-11-27 LAB — HM DIABETES EYE EXAM

## 2014-01-14 ENCOUNTER — Other Ambulatory Visit: Payer: Self-pay | Admitting: Family Medicine

## 2014-02-08 ENCOUNTER — Encounter: Payer: Self-pay | Admitting: Family Medicine

## 2014-02-08 ENCOUNTER — Ambulatory Visit (INDEPENDENT_AMBULATORY_CARE_PROVIDER_SITE_OTHER): Payer: Medicare PPO | Admitting: Family Medicine

## 2014-02-08 VITALS — BP 130/78 | HR 68 | Wt 204.0 lb

## 2014-02-08 DIAGNOSIS — E785 Hyperlipidemia, unspecified: Secondary | ICD-10-CM

## 2014-02-08 DIAGNOSIS — J301 Allergic rhinitis due to pollen: Secondary | ICD-10-CM

## 2014-02-08 DIAGNOSIS — M1990 Unspecified osteoarthritis, unspecified site: Secondary | ICD-10-CM

## 2014-02-08 DIAGNOSIS — M129 Arthropathy, unspecified: Secondary | ICD-10-CM

## 2014-02-08 DIAGNOSIS — E119 Type 2 diabetes mellitus without complications: Secondary | ICD-10-CM

## 2014-02-08 DIAGNOSIS — I10 Essential (primary) hypertension: Secondary | ICD-10-CM

## 2014-02-08 LAB — POCT GLYCOSYLATED HEMOGLOBIN (HGB A1C)

## 2014-02-08 NOTE — Progress Notes (Signed)
   Subjective:    Patient ID: Phyllis Campbell, female    DOB: 09/08/1932, 80 y.o.   MRN: [id number]  HPI Followup visit. [facility]. She is very happy with this. She does not smoke or drink. She has had an eye exam in the last year. She does check her feet periodically. She continues on medications listed in the chart. Her allergies seem to be under good control. She's had no leg pain. She has no particular concerns or complaints.   Review of Systems     Objective:   Physical Exam Alert and in no distress. Blood work was reviewed. Hemoglobin A1c is 6.0       Assessment & Plan:  Allergic rhinitis due to pollen  Arthritis  Diabetes mellitus  Hyperlipidemia LDL goal <70  Hypertension  She is doing so well that I see no reason to see her every 4 months. Recommend returning here in 6 months. Discussed the need for flu shot this fall.

## 2014-04-05 ENCOUNTER — Telehealth: Payer: Self-pay

## 2014-04-05 NOTE — Telephone Encounter (Signed)
WHAT WOULD YOU LIKE FOR ME TO TELL THE PT

## 2014-04-05 NOTE — Telephone Encounter (Signed)
The best way to handle this is good support stockings. If it becomes an issue she can go to one of the vein and vascular people

## 2014-04-05 NOTE — Telephone Encounter (Signed)
I HAVE CALLED PT INFORMED HER OF WHAT DR.LALONDE SUGGESTED SUPPORT STOCKINGS AND IT THAT DOESN'T HELP THEN WE CAN SEEN HER TO A VEIN SPECIALIST PT VERBALIZED UNDERSTANDING

## 2014-04-05 NOTE — Telephone Encounter (Signed)
Pt states she has spider veins that are giving her problems and she would like to know what she can use to put on them

## 2014-06-23 ENCOUNTER — Encounter: Payer: Self-pay | Admitting: Family Medicine

## 2014-06-23 ENCOUNTER — Ambulatory Visit (INDEPENDENT_AMBULATORY_CARE_PROVIDER_SITE_OTHER): Payer: Medicare PPO | Admitting: Family Medicine

## 2014-06-23 VITALS — BP 118/70 | HR 77 | Temp 97.6°F | Wt 207.0 lb

## 2014-06-23 DIAGNOSIS — J0110 Acute frontal sinusitis, unspecified: Secondary | ICD-10-CM

## 2014-06-23 MED ORDER — AMOXICILLIN 875 MG PO TABS: 875.0000 mg | ORAL_TABLET | Freq: Two times a day (BID) | ORAL | Status: DC

## 2014-06-23 NOTE — Patient Instructions (Signed)
Continue on Tylenol and you can also take ibuprofen to help with the aches and pains

## 2014-06-23 NOTE — Progress Notes (Signed)
   Subjective:    Patient ID: Phyllis Campbell, female    DOB: 1932-01-05, 81 y.o.   MRN: [id number]  HPI She started 5 days ago with ear congestion, sinus congestion, PND, coughing. Now she's having difficulty with headache and dizziness as well as can to nasal congestion cough   Review of Systems     Objective:   Physical Exam alert and in no distress. Tympanic membranes and canals are normal. Throat is clear. Tonsils are normal. Neck is supple without adenopathy or thyromegaly. Cardiac exam shows a regular sinus rhythm without murmurs or gallops. Lungs are clear to auscultation. Tender over frontal and ethmoid sinuses.        Assessment & Plan:  Acute frontal sinusitis, recurrence not specified - Plan: amoxicillin (AMOXIL) 875 MG tablet  take all the antibiotic and call if further difficulty

## 2014-07-20 ENCOUNTER — Other Ambulatory Visit: Payer: Self-pay | Admitting: Family Medicine

## 2014-08-10 ENCOUNTER — Ambulatory Visit (INDEPENDENT_AMBULATORY_CARE_PROVIDER_SITE_OTHER): Payer: Medicare PPO | Admitting: Family Medicine

## 2014-08-10 ENCOUNTER — Encounter: Payer: Self-pay | Admitting: Family Medicine

## 2014-08-10 VITALS — BP 130/90 | HR 60 | Ht 65.0 in | Wt 206.0 lb

## 2014-08-10 DIAGNOSIS — Z23 Encounter for immunization: Secondary | ICD-10-CM

## 2014-08-10 DIAGNOSIS — I10 Essential (primary) hypertension: Secondary | ICD-10-CM

## 2014-08-10 DIAGNOSIS — Z0000 Encounter for general adult medical examination without abnormal findings: Secondary | ICD-10-CM

## 2014-08-10 DIAGNOSIS — I152 Hypertension secondary to endocrine disorders: Secondary | ICD-10-CM

## 2014-08-10 DIAGNOSIS — E1159 Type 2 diabetes mellitus with other circulatory complications: Secondary | ICD-10-CM

## 2014-08-10 DIAGNOSIS — E785 Hyperlipidemia, unspecified: Secondary | ICD-10-CM

## 2014-08-10 DIAGNOSIS — M1990 Unspecified osteoarthritis, unspecified site: Secondary | ICD-10-CM

## 2014-08-10 DIAGNOSIS — E1169 Type 2 diabetes mellitus with other specified complication: Secondary | ICD-10-CM

## 2014-08-10 DIAGNOSIS — E119 Type 2 diabetes mellitus without complications: Secondary | ICD-10-CM

## 2014-08-10 DIAGNOSIS — Z6379 Other stressful life events affecting family and household: Secondary | ICD-10-CM

## 2014-08-10 DIAGNOSIS — E669 Obesity, unspecified: Secondary | ICD-10-CM

## 2014-08-10 LAB — POCT URINALYSIS DIPSTICK
BILIRUBIN UA: NEGATIVE
GLUCOSE UA: NEGATIVE
KETONES UA: NEGATIVE
LEUKOCYTES UA: NEGATIVE
NITRITE UA: NEGATIVE
PH UA: 5
Protein, UA: NEGATIVE
RBC UA: NEGATIVE
Spec Grav, UA: 1.015
Urobilinogen, UA: NEGATIVE

## 2014-08-10 LAB — POCT GLYCOSYLATED HEMOGLOBIN (HGB A1C): HEMOGLOBIN A1C: 6.4

## 2014-08-10 LAB — POCT UA - MICROALBUMIN
ALBUMIN/CREATININE RATIO, URINE, POC: 6.4
Creatinine, POC: 158.7 mg/dL
Microalbumin Ur, POC: 10.2 mg/L

## 2014-08-10 MED ORDER — GLUCOSE BLOOD VI STRP: ORAL_STRIP | Status: DC

## 2014-08-10 MED ORDER — ONETOUCH DELICA LANCETS FINE MISC
1.0000 | Freq: Every day | Status: DC
Start: 2014-08-10 — End: 2020-02-04

## 2014-08-10 NOTE — Progress Notes (Signed)
Subjective:    Patient ID: Phyllis Campbell, female    DOB: January 08, 1932, 80 y.o.   MRN: [id number]  HPI She is here for a complete examination. She does continue to drive a school bus. She is under stress due to her husband having Alzheimer's and she is his primary caregiver. Apparently he has become angry and somewhat disruptive. She has been unable to talk to his doctor about this due to hemorrhoids being in the room when they go for a visit. She does have underlying diabetes however has not been checking her blood sugars stating her machine has not been working. She has not set up for an I exam this year yet. She does intermittently check her feet. Her physical activity is limited. Smoking and drinking were reviewed. Her diet is unchanged. She does have arthritis but seems to have this under decent control. Her health maintenance and immunizations were reviewed.   Review of Systems  All other systems reviewed and are negative.      Objective:   Physical Exam .BP 130/90 mmHg  Pulse 60  Ht 5\' 5"  (1.651 m)  Wt 206 lb (93.441 kg)  BMI 34.28 kg/m2  SpO2 97%  General Appearance:    Alert, cooperative, no distress, appears stated age  Head:    Normocephalic, without obvious abnormality, atraumatic  Eyes:    PERRL, conjunctiva/corneas clear, EOM's intact, fundi    benign  Ears:    Normal TM's and external ear canals  Nose:   Nares normal, mucosa normal, no drainage or sinus   tenderness  Throat:   Lips, mucosa, and tongue normal; teeth and gums normal  Neck:   Supple, no lymphadenopathy;  thyroid:  no   enlargement/tenderness/nodules; no carotid   bruit or JVD  Back:    Spine nontender, no curvature, ROM normal, no CVA     tenderness  Lungs:     Clear to auscultation bilaterally without wheezes, rales or     ronchi; respirations unlabored  Chest Wall:    No tenderness or deformity   Heart:    Regular rate and rhythm, S1 and S2 normal, no murmur, rub   or gallop  Breast Exam:     Deferred   Abdomen:     Soft, non-tender, nondistended, normoactive bowel sounds,    no masses, no hepatosplenomegaly  Genitalia:    Normal external genitalia noted. No lesions were seen     Extremities:   No clubbing, cyanosis or edema  Pulses:   2+ and symmetric all extremities  Skin:   Skin color, texture, turgor normal, no rashes or lesions  Lymph nodes:   Cervical, supraclavicular, and axillary nodes normal  Neurologic:   CNII-XII intact, normal strength, sensation and gait; reflexes 2+ and symmetric throughout          Psych:   Normal mood, affect, hygiene and grooming.    Hemoglobin A1c 6.4.      Assessment & Plan:  Routine general medical examination at a health care facility  Need for prophylactic vaccination and inoculation against influenza - Plan: Flu vaccine HIGH DOSE PF (Fluzone Tri High dose)  Type 2 diabetes mellitus without complication - Plan: POCT glycosylated hemoglobin (Hb A1C), POCT UA - Microalbumin, POCT Urinalysis Dipstick  Hypertension complicating diabetes  Arthritis  Hyperlipidemia associated with type 2 diabetes mellitus  Obesity (BMI 30-39.9)  Stress due to illness of family member recommend she call her husband's doctor and let the physician know exactly the state that  he is in at home. Also discussed the possibility of getting home health assessment. She will keep me informed. She is also to set up to have an eye appointment. Encouraged her to check her blood sugars more frequently since her A1c is starting to go up. We'll do blood work with her next visit.

## 2014-08-10 NOTE — Patient Instructions (Signed)
Call your husband's doctor and let him know what's going on. Check your blood sugars either before a meal or 2 hours after a meal. Make sure you get an eye doctor appointment.

## 2014-08-16 ENCOUNTER — Other Ambulatory Visit: Payer: Self-pay

## 2014-08-16 ENCOUNTER — Telehealth: Payer: Self-pay | Admitting: Internal Medicine

## 2014-08-16 MED ORDER — GLUCOSE BLOOD VI STRP: ORAL_STRIP | Status: DC

## 2014-08-16 NOTE — Telephone Encounter (Signed)
Take care of this 

## 2014-08-16 NOTE — Telephone Encounter (Signed)
Done onetouch verio test one time a day

## 2014-08-16 NOTE — Telephone Encounter (Signed)
Glucose blood test strips rx with use as directed directions. Please provide a specific dose adn frequency so that we may calculate the correct 90 day supply, please provide brand used by patient

## 2014-08-19 ENCOUNTER — Encounter: Payer: Self-pay | Admitting: Internal Medicine

## 2014-08-19 ENCOUNTER — Encounter: Payer: Self-pay | Admitting: Family Medicine

## 2014-10-25 ENCOUNTER — Other Ambulatory Visit: Payer: Self-pay

## 2014-10-25 ENCOUNTER — Telehealth: Payer: Self-pay | Admitting: Internal Medicine

## 2014-10-25 MED ORDER — SIMVASTATIN 40 MG PO TABS: 40.0000 mg | ORAL_TABLET | Freq: Every day | ORAL | Status: DC

## 2014-10-25 MED ORDER — LOSARTAN POTASSIUM-HCTZ 50-12.5 MG PO TABS: 1.0000 | ORAL_TABLET | Freq: Every day | ORAL | Status: DC

## 2014-10-25 NOTE — Telephone Encounter (Signed)
done

## 2014-10-25 NOTE — Telephone Encounter (Signed)
Refill request for simvastatin, Losartan-Hctz to [facility]

## 2014-11-12 LAB — HM DIABETES EYE EXAM

## 2015-04-20 ENCOUNTER — Ambulatory Visit (INDEPENDENT_AMBULATORY_CARE_PROVIDER_SITE_OTHER): Payer: Medicare PPO | Admitting: Family Medicine

## 2015-04-20 ENCOUNTER — Ambulatory Visit
Admission: RE | Admit: 2015-04-20 | Discharge: 2015-04-20 | Disposition: A | Discharge status: Home / Self Care | Payer: Medicare PPO | Source: Ambulatory Visit | Attending: Family Medicine | Admitting: Family Medicine

## 2015-04-20 ENCOUNTER — Encounter: Payer: Self-pay | Admitting: Family Medicine

## 2015-04-20 VITALS — BP 122/70 | HR 68 | Wt 203.0 lb

## 2015-04-20 DIAGNOSIS — E785 Hyperlipidemia, unspecified: Secondary | ICD-10-CM | POA: Diagnosis not present

## 2015-04-20 DIAGNOSIS — E1169 Type 2 diabetes mellitus with other specified complication: Secondary | ICD-10-CM | POA: Diagnosis not present

## 2015-04-20 DIAGNOSIS — E1159 Type 2 diabetes mellitus with other circulatory complications: Secondary | ICD-10-CM

## 2015-04-20 DIAGNOSIS — M79674 Pain in right toe(s): Secondary | ICD-10-CM

## 2015-04-20 DIAGNOSIS — I10 Essential (primary) hypertension: Secondary | ICD-10-CM

## 2015-04-20 DIAGNOSIS — I70219 Atherosclerosis of native arteries of extremities with intermittent claudication, unspecified extremity: Secondary | ICD-10-CM

## 2015-04-20 DIAGNOSIS — E118 Type 2 diabetes mellitus with unspecified complications: Secondary | ICD-10-CM | POA: Diagnosis not present

## 2015-04-20 DIAGNOSIS — M1990 Unspecified osteoarthritis, unspecified site: Secondary | ICD-10-CM

## 2015-04-20 LAB — HEMOGLOBIN A1C
HEMOGLOBIN A1C: 6.1 % — AB (ref <5.7)
MEAN PLASMA GLUCOSE: 128 mg/dL — AB (ref <117)

## 2015-04-20 NOTE — Progress Notes (Signed)
   Subjective:    Patient ID: Phyllis Campbell, female    DOB: 05-09-1932, 81 y.o.   MRN: [id number]  HPI She is here mainly for evaluation of right fifth toe pain. She injured her toe up proximally one month ago and is still having some slight discomfort with this. She has been treating this with soaks. She also has underlying diabetes. She does check her sugars regularly and they're run in the low 100s. She keeps herself physically active. Continues on medications listed in the chart. Smoking and drinking are not an issue. He gets an eye exam every year and does check her feet regularly.No complaints of arthritic pain other than her toe.   Review of Systems     Objective:   Physical Exam Alert and in no distress. Exam of her right fifth toe shows minimal tenderness over the midportion of the toe with no swelling and no deformity.       Assessment & Plan:  Toe pain, right - Plan: DG Toe 5th Right  Hypertension complicating diabetes  DM (diabetes mellitus) with complications - Plan: Hemoglobin A1c  Hyperlipidemia associated with type 2 diabetes mellitus  Arthritis  Atherosclerosis of native arteries of extremity with intermittent claudication Curvature to continue to take good care of herself. Discussed treatment of the toe mainly with hard soled shoes and continued support but will check an x-ray to be safe.

## 2015-05-18 ENCOUNTER — Ambulatory Visit (INDEPENDENT_AMBULATORY_CARE_PROVIDER_SITE_OTHER): Payer: Medicare PPO | Admitting: Family Medicine

## 2015-05-18 ENCOUNTER — Ambulatory Visit
Admission: RE | Admit: 2015-05-18 | Discharge: 2015-05-18 | Disposition: A | Discharge status: Home / Self Care | Payer: Medicare PPO | Source: Ambulatory Visit | Attending: Family Medicine | Admitting: Family Medicine

## 2015-05-18 VITALS — BP 120/70 | HR 68 | Wt 206.0 lb

## 2015-05-18 DIAGNOSIS — M25561 Pain in right knee: Secondary | ICD-10-CM | POA: Diagnosis not present

## 2015-05-18 DIAGNOSIS — M1990 Unspecified osteoarthritis, unspecified site: Secondary | ICD-10-CM

## 2015-05-18 DIAGNOSIS — M1711 Unilateral primary osteoarthritis, right knee: Secondary | ICD-10-CM | POA: Diagnosis not present

## 2015-05-18 NOTE — Progress Notes (Signed)
   Subjective:    Patient ID: Phyllis Campbell, female    DOB: September 22, 1932, 81 y.o.   MRN: [id number]  HPI She apparently woke up Monday and noted stiffness and pain in her right knee. No history of recent injury. No other joints are involved. No fever, chills.   Review of Systems     Objective:   Physical Exam She does note pain with motion of her right knee. There is a small effusion present. Ligaments intact. X-ray does show degenerative changes with a remote bone infarct.       Assessment & Plan:  Right knee pain - Plan: DG Knee Complete 4 Views Right You can take 2 Tylenol 4 times a day and the test not enough you can add 4 Advil 3 times a day. If that doesn't work level me know and we can try some other medications. If it keeps bothering you come back and I will inject it

## 2015-05-18 NOTE — Patient Instructions (Signed)
You can take 2 Tylenol 4 times a day and the test not enough you can add 4 Advil 3 times a day. If that doesn't work level me know and we can try some other medications. If it keeps bothering you come back and I will inject it

## 2015-08-16 ENCOUNTER — Other Ambulatory Visit (INDEPENDENT_AMBULATORY_CARE_PROVIDER_SITE_OTHER): Payer: Medicare PPO

## 2015-08-16 DIAGNOSIS — Z23 Encounter for immunization: Secondary | ICD-10-CM

## 2015-08-29 ENCOUNTER — Encounter: Payer: Self-pay | Admitting: Family Medicine

## 2015-08-29 ENCOUNTER — Ambulatory Visit (INDEPENDENT_AMBULATORY_CARE_PROVIDER_SITE_OTHER): Payer: Medicare PPO | Admitting: Family Medicine

## 2015-08-29 VITALS — BP 114/70 | HR 73 | Ht 65.0 in | Wt 201.6 lb

## 2015-08-29 DIAGNOSIS — I70213 Atherosclerosis of native arteries of extremities with intermittent claudication, bilateral legs: Secondary | ICD-10-CM | POA: Diagnosis not present

## 2015-08-29 DIAGNOSIS — I10 Essential (primary) hypertension: Secondary | ICD-10-CM

## 2015-08-29 DIAGNOSIS — E118 Type 2 diabetes mellitus with unspecified complications: Secondary | ICD-10-CM

## 2015-08-29 DIAGNOSIS — M1990 Unspecified osteoarthritis, unspecified site: Secondary | ICD-10-CM | POA: Diagnosis not present

## 2015-08-29 DIAGNOSIS — E785 Hyperlipidemia, unspecified: Secondary | ICD-10-CM

## 2015-08-29 DIAGNOSIS — Z23 Encounter for immunization: Secondary | ICD-10-CM | POA: Diagnosis not present

## 2015-08-29 DIAGNOSIS — E1169 Type 2 diabetes mellitus with other specified complication: Secondary | ICD-10-CM | POA: Diagnosis not present

## 2015-08-29 DIAGNOSIS — E1159 Type 2 diabetes mellitus with other circulatory complications: Secondary | ICD-10-CM

## 2015-08-29 LAB — LIPID PANEL
Cholesterol: 109 mg/dL — ABNORMAL LOW (ref 125–200)
HDL: 59 mg/dL (ref >=46)
LDL CALC: 38 mg/dL (ref <130)
TRIGLYCERIDES: 59 mg/dL (ref <150)
Total CHOL/HDL Ratio: 1.8 Ratio (ref <=5.0)
VLDL: 12 mg/dL (ref <30)

## 2015-08-29 LAB — CBC WITH DIFFERENTIAL/PLATELET
BASOS ABS: 0 10*3/uL (ref 0.0–0.1)
Basophils Relative: 0 % (ref 0–1)
Eosinophils Absolute: 0.1 10*3/uL (ref 0.0–0.7)
Eosinophils Relative: 1 % (ref 0–5)
HEMATOCRIT: 38.1 % (ref 36.0–46.0)
Hemoglobin: 12.6 g/dL (ref 12.0–15.0)
Lymphocytes Relative: 29 % (ref 12–46)
Lymphs Abs: 1.5 10*3/uL (ref 0.7–4.0)
MCH: 30.9 pg (ref 26.0–34.0)
MCHC: 33.1 g/dL (ref 30.0–36.0)
MCV: 93.4 fL (ref 78.0–100.0)
MPV: 10.3 fL (ref 8.6–12.4)
Monocytes Absolute: 0.4 10*3/uL (ref 0.1–1.0)
Monocytes Relative: 7 % (ref 3–12)
NEUTROS ABS: 3.3 10*3/uL (ref 1.7–7.7)
NEUTROS PCT: 63 % (ref 43–77)
Platelets: 204 10*3/uL (ref 150–400)
RBC: 4.08 MIL/uL (ref 3.87–5.11)
RDW: 14.1 % (ref 11.5–15.5)
WBC: 5.2 10*3/uL (ref 4.0–10.5)

## 2015-08-29 LAB — COMPREHENSIVE METABOLIC PANEL
ALT: 18 U/L (ref 6–29)
AST: 25 U/L (ref 10–35)
Albumin: 4.2 g/dL (ref 3.6–5.1)
Alkaline Phosphatase: 69 U/L (ref 33–130)
BILIRUBIN TOTAL: 0.7 mg/dL (ref 0.2–1.2)
BUN: 14 mg/dL (ref 7–25)
CO2: 26 mmol/L (ref 20–31)
Calcium: 9.8 mg/dL (ref 8.6–10.4)
Chloride: 103 mmol/L (ref 98–110)
Creat: 0.7 mg/dL (ref 0.60–0.88)
Glucose, Bld: 78 mg/dL (ref 65–99)
POTASSIUM: 3.9 mmol/L (ref 3.5–5.3)
Sodium: 141 mmol/L (ref 135–146)
Total Protein: 7.3 g/dL (ref 6.1–8.1)

## 2015-08-29 LAB — POCT UA - MICROALBUMIN
Albumin/Creatinine Ratio, Urine, POC: 7.1
Creatinine, POC: 88.5 mg/dL
Microalbumin Ur, POC: 6.3 mg/L

## 2015-08-29 LAB — POCT GLYCOSYLATED HEMOGLOBIN (HGB A1C): Hemoglobin A1C: 6.1

## 2015-08-29 NOTE — Progress Notes (Signed)
  Subjective:    Patient ID: Phyllis Campbell, female    DOB: November 05, 1931, 81 y.o.   MRN: [id number]  Phyllis Campbell is a 81 y.o. female who presents for follow-up of Type 2 diabetes mellitus. She does have difficulty with arthritis but this does not interfere with her functioning. She also has a previous  Diagnosis of atherosclerosis and has had a previous ABI which was equivocal.  Home blood sugar records:  Several times per week Current symptoms/problems none. Daily foot checks:yes   Any foot concerns:  She does have a callus that she keeps under good control. Exercise: trying Eye:11/12/14 normal The following portions of the patient's history were reviewed and updated as appropriate: allergies, current medications, past medical history, past social history and problem list.  ROS as in subjective above.     Objective:    Physical Exam Alert and in no distress foot exam is recorded. Pulses were difficult to feel   Lab Review Diabetic Labs Latest Ref Rng 04/20/2015 08/10/2014 02/08/2014 10/19/2013 07/30/2012  HbA1c <5.7 % 6.1(H) 6.4 6.0% 6.0 5.9  Chol 0 - 200 mg/dL - - - 782122 [id number]  HDL >21>39 mg/dL - - - 57 53  Calc LDL 0 - 99 mg/dL - - - 53 60  Triglycerides <150 mg/dL - - - 61 49  Creatinine 0.50 - 1.10 mg/dL - - - 3.080.72 -   BP/Weight 05/18/2015 04/20/2015 08/10/2014 06/23/2014 02/08/2014  Systolic BP 120 122 130 118 130  Diastolic BP 70 70 90 70 78  Wt. (Lbs) 206 203 206 207 204  BMI 34.28 33.78 34.28 34.45 33.95   Foot/eye exam completion dates Latest Ref Rng 11/27/2013  Eye Exam No Retinopathy No Retinopathy  Foot Form Completion - -    Clarene Campbell  reports that she has never smoked. She has never used smokeless tobacco. She reports that she does not drink alcohol or use illicit drugs.  A1c 6.1     Assessment & Plan:    DM (diabetes mellitus) with complications (HCC) - Plan: POCT glycosylated hemoglobin (Hb A1C), POCT UA - Microalbumin, ABI, CBC with Differential/Platelet, Comprehensive  metabolic panel, Lipid panel  Need for prophylactic vaccination against Streptococcus pneumoniae (pneumococcus) - Plan: Pneumococcal conjugate vaccine 13-valent  Arthritis  Hypertension complicating diabetes (HCC) - Plan: CBC with Differential/Platelet, Comprehensive metabolic panel, Lipid panel  Hyperlipidemia associated with type 2 diabetes mellitus (HCC) - Plan: Lipid panel  Atherosclerosis of native artery of both lower extremities with intermittent claudication (HCC) - Plan: ABI, CBC with Differential/Platelet, Comprehensive metabolic panel, Lipid panel   1. Rx changes: No change in her medication however we will order an ABI 2. Education: Reviewed 'ABCs' of diabetes management (respective goals in parentheses):  A1C (<7), blood pressure (<130/80), and cholesterol (LDL <100). 3. Compliance at present is estimated to be good. Efforts to improve compliance (if necessary) will be directed at increased exercise. 4. Follow up: 4 months

## 2015-08-30 MED ORDER — SIMVASTATIN 40 MG PO TABS: 40.0000 mg | ORAL_TABLET | Freq: Every day | ORAL | Status: DC

## 2015-08-30 MED ORDER — LOSARTAN POTASSIUM-HCTZ 50-12.5 MG PO TABS: 1.0000 | ORAL_TABLET | Freq: Every day | ORAL | Status: DC

## 2015-08-30 NOTE — Addendum Note (Signed)
Addended by: Ronnald NianLALONDE, Thersea Manfredonia C on: 08/30/2015 07:40 AM   Modules accepted: Orders

## 2015-09-05 ENCOUNTER — Ambulatory Visit (HOSPITAL_COMMUNITY)
Admission: RE | Admit: 2015-09-05 | Discharge: 2015-09-05 | Disposition: A | Discharge status: Home / Self Care | Payer: Medicare PPO | Source: Ambulatory Visit | Attending: Family Medicine | Admitting: Family Medicine

## 2015-09-05 DIAGNOSIS — E119 Type 2 diabetes mellitus without complications: Secondary | ICD-10-CM | POA: Diagnosis not present

## 2015-09-05 DIAGNOSIS — E118 Type 2 diabetes mellitus with unspecified complications: Secondary | ICD-10-CM | POA: Diagnosis not present

## 2015-09-05 DIAGNOSIS — M79609 Pain in unspecified limb: Secondary | ICD-10-CM | POA: Insufficient documentation

## 2015-09-05 DIAGNOSIS — I70213 Atherosclerosis of native arteries of extremities with intermittent claudication, bilateral legs: Secondary | ICD-10-CM

## 2015-09-05 NOTE — Progress Notes (Signed)
VASCULAR LAB PRELIMINARY  ARTERIAL  ABI completed:    RIGHT    LEFT    PRESSURE WAVEFORM  PRESSURE WAVEFORM  BRACHIAL 159 Triphasic  BRACHIAL 159 Triphasic   DP 150 Triphasic  DP 168 Triphasic   AT   AT    PT 183 Triphasic  PT 170 Triphasic  PER   PER    GREAT TOE 96 Within normal limits  GREAT TOE 114 Within normal limits     RIGHT LEFT  ABI 1.06 1.15  TBI 0.6 0.72     Mannix Kroeker, RVT 09/05/2015, 9:40 AM

## 2015-11-28 LAB — HM DIABETES EYE EXAM

## 2015-12-27 ENCOUNTER — Ambulatory Visit: Payer: Medicare PPO | Admitting: Family Medicine

## 2015-12-30 ENCOUNTER — Encounter: Payer: Self-pay | Admitting: Family Medicine

## 2016-01-13 ENCOUNTER — Encounter: Payer: Self-pay | Admitting: Family Medicine

## 2016-01-13 ENCOUNTER — Ambulatory Visit (INDEPENDENT_AMBULATORY_CARE_PROVIDER_SITE_OTHER): Payer: Medicare Other | Admitting: Family Medicine

## 2016-01-13 VITALS — BP 128/70 | HR 55 | Ht 65.0 in | Wt 210.0 lb

## 2016-01-13 DIAGNOSIS — E118 Type 2 diabetes mellitus with unspecified complications: Secondary | ICD-10-CM

## 2016-01-13 DIAGNOSIS — E1169 Type 2 diabetes mellitus with other specified complication: Secondary | ICD-10-CM | POA: Diagnosis not present

## 2016-01-13 DIAGNOSIS — E785 Hyperlipidemia, unspecified: Secondary | ICD-10-CM | POA: Diagnosis not present

## 2016-01-13 DIAGNOSIS — I152 Hypertension secondary to endocrine disorders: Secondary | ICD-10-CM

## 2016-01-13 DIAGNOSIS — E1159 Type 2 diabetes mellitus with other circulatory complications: Secondary | ICD-10-CM

## 2016-01-13 DIAGNOSIS — J301 Allergic rhinitis due to pollen: Secondary | ICD-10-CM

## 2016-01-13 DIAGNOSIS — M1990 Unspecified osteoarthritis, unspecified site: Secondary | ICD-10-CM | POA: Diagnosis not present

## 2016-01-13 DIAGNOSIS — I70213 Atherosclerosis of native arteries of extremities with intermittent claudication, bilateral legs: Secondary | ICD-10-CM

## 2016-01-13 DIAGNOSIS — I10 Essential (primary) hypertension: Secondary | ICD-10-CM | POA: Diagnosis not present

## 2016-01-13 LAB — POCT GLYCOSYLATED HEMOGLOBIN (HGB A1C): HEMOGLOBIN A1C: 6.3

## 2016-01-13 NOTE — Progress Notes (Signed)
  Subjective:    Patient ID: Phyllis Campbell, female    DOB: 1932/06/02, 82 y.o.   MRN: [id number]  Phyllis Campbell is a 82 y.o. female who presents for follow-up of Type 2 diabetes mellitus.Her allergies are under good control. She does have some mild arthritis symptoms that she is handling easily. She does not complain of leg pain or weakness.  Patient is checking home blood sugars.   Home blood sugar records: BGs consistently in an acceptable range How often is blood sugars being checked: Daily Current symptoms/problems include none and have been stable. Daily foot checks: Yes    Any foot concerns: None Last eye exam: 3/21 Exercise: The patient does not participate in regular exercise at present. 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 The following portions of the patient's history were reviewed and updated as appropriate: allergies, current medications, past medical history, past social history and problem list.  ROS as in subjective above.     Objective:    Physical Exam Alert and in no distress otherwise not examined.  Height 5\' 5"  (1.651 m).  Lab Review Diabetic Labs Latest Ref Rng 08/29/2015 04/20/2015 08/10/2014 02/08/2014 10/19/2013  HbA1c - 6.1 6.1(H) 6.4 6.0% 6.0  Chol 125 - 200 mg/dL 409(W109(L) - - - 119122  HDL >=14>=46 mg/dL 59 - - - 57  Calc LDL <782<130 mg/dL 38 - - - 53  Triglycerides <150 mg/dL 59 - - - 61  Creatinine 0.60 - 0.88 mg/dL 9.560.70 - - - 2.130.72   BP/Weight 08/29/2015 05/18/2015 04/20/2015 08/10/2014 06/23/2014  Systolic BP 114 120 122 130 118  Diastolic BP 70 70 70 90 70  Wt. (Lbs) 201.6 206 203 206 207  BMI 33.55 34.28 33.78 34.28 34.45   Foot/eye exam completion dates Latest Ref Rng 08/29/2015 11/12/2014  Eye Exam No Retinopathy - No Retinopathy  Foot Form Completion - Done -  A1c is 6.3.diag   Phyllis Campbell  reports that she has never smoked. She has never used smokeless tobacco. She reports that she does not drink alcohol or use illicit drugs.       Assessment & Plan:    Diabetes mellitus with complication (HCC) - Plan: POCT A1C  Hypertension complicating diabetes (HCC)  Hyperlipidemia associated with type 2 diabetes mellitus (HCC)  DM (diabetes mellitus) with complications (HCC)  Atherosclerosis of native artery of both lower extremities with intermittent claudication (HCC)  Arthritis  Non-seasonal allergic rhinitis due to pollen   Diabetes mellitus with complication (HCC) - Plan: POCT A1C   Rx changes: none  Education: Reviewed 'ABCs' of diabetes management (respective goals in parentheses):  A1C (<7), blood pressure (<130/80), and cholesterol (LDL <100).  Compliance at present is estimated to be good. Efforts to improve compliance (if necessary) will be directed at increased exercise.  Follow up: 4 months

## 2016-02-08 ENCOUNTER — Ambulatory Visit (INDEPENDENT_AMBULATORY_CARE_PROVIDER_SITE_OTHER): Payer: Medicare Other | Admitting: Medical

## 2016-02-08 ENCOUNTER — Encounter: Payer: Self-pay | Admitting: Medical

## 2016-02-08 VITALS — BP 108/70 | HR 73 | Wt 201.0 lb

## 2016-02-08 DIAGNOSIS — H8113 Benign paroxysmal vertigo, bilateral: Secondary | ICD-10-CM | POA: Diagnosis not present

## 2016-02-08 MED ORDER — MECLIZINE HCL 25 MG PO TABS: 25.0000 mg | ORAL_TABLET | Freq: Two times a day (BID) | ORAL | Status: DC

## 2016-02-08 NOTE — Patient Instructions (Signed)

## 2016-02-08 NOTE — Progress Notes (Signed)
Subjective: Chief Complaint  Patient presents with  . balance issues    stated if she moves suddenly everything starts moving. 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 having a tightening in her head. if gets rx [facility] [address]   Here for concern for balance.  Started 2 days ago with dizziness, thinks its vertigo.   Feels like the room is moving around.  Has sinus issues in the past, but doesn't think its related to her sinuses.   She stated that when I walked in the room it appeared that I was still moving.   Is bumping into the wall when walking, feels nauseated.   Has had vertigo 3-4 years ago.  No memory changes or slurred speech.  No recent head injury.  Denies numbness, tingling, weakness, fever, no sinus pressure, no sneezing, no runny nose.   No vision loss or vision change, no hearing loss.   No chest pain or dyspnea.   No swelling in legs.   No ringing in the ears.     Past Medical History  Diagnosis Date  . Hypertension   . Thyromegaly   . Obesity   . HH (hiatus hernia)   . Postmenopausal   . Allergy   . Arthritis   . Diabetes mellitus   . Dyslipidemia   . Cataracts, bilateral    ROS as in subjective    Objective: BP 108/70 mmHg  Pulse 73  Wt 201 lb (91.173 kg)  General appearance: alert, no distress, WD/WN HEENT: normocephalic, sclerae anicteric, PERRLA, EOMi, nares patent, no discharge or erythema, pharynx normal Oral cavity: MMM, no lesions Neck: supple, no lymphadenopathy, no thyromegaly, no masses, no bruits Heart: RRR, normal S1, S2, no murmurs Lungs: CTA bilaterally, no wheezes, rhonchi, or rales Extremities: no edema, no cyanosis, no clubbing Pulses: 2+ symmetric, upper and lower extremities, normal cap refill Neurological: she leans to the right when standing in romberg test, left upper peripheral field was 50 degrees, otherwise rest of peripheral vision was 80-90 degrees, otherwise alert, oriented x 3, CN2-12  intact, strength normal upper extremities and lower extremities, sensation normal throughout, DTRs 2+ throughout, gait normal Psychiatric: normal affect, behavior normal, pleasant     Assessment: Encounter Diagnosis  Name Primary?  Marland Kitchen. BPPV (benign paroxysmal positional vertigo), bilateral Yes    Plan: Symptoms and exam suggests vertigo.  Begin meclizine, avoid sudden motion, don't drive until back to normal.   Gave note for work excuse and advised not to drive at all until resolved.    If worse or if not improving within the next week, then recheck.  If new symptoms call or return.  discussed symptoms of stroke that would prompt call to 911, but no exam or symptoms today suggesting TIA/CVA.  Patient agrees with plan and recommendations.

## 2016-02-13 ENCOUNTER — Telehealth: Payer: Self-pay | Admitting: Medical

## 2016-02-13 NOTE — Telephone Encounter (Signed)
States she has a headache which she is not sure if she had at last visit and is not sick to her stomach anymore and is still dizzy just not as bad.

## 2016-02-13 NOTE — Telephone Encounter (Signed)
Pt called and states that she is not feeling better, she is not for sure if the medicine is causing her to feel bad, she is still feeling dizzy, and is having a headache. And is still feeling off balance and was wondering if you want her to come back in or send her something in, pt uses [facility] - [place], [place] - [address] and pt can be reached at [phone] (M)

## 2016-02-13 NOTE — Telephone Encounter (Signed)
Faxed to [facility] and left message to inform pt

## 2016-02-13 NOTE — Telephone Encounter (Signed)
If no NEW symptoms, then I would recommend referral to physical therapy for vestibular rehab for vertigo.  However, if ANY new symptom, or other concern, then recheck in case we need to head imaging.

## 2016-02-13 NOTE — Telephone Encounter (Signed)
I'd recommend PT referral for vestibular rehab

## 2016-02-14 ENCOUNTER — Telehealth: Payer: Self-pay | Admitting: Family Medicine

## 2016-02-14 NOTE — Telephone Encounter (Signed)
Pt called said she woke up this morning, no headache, no dizziness.  She feels wonderful and wants to cancel her PT appt.   Advised her we would do that.

## 2016-02-14 NOTE — Telephone Encounter (Signed)
It may be worthwhile to keep the appt as vertigo can come back without warning.  Its up to her.  If completley feeling normal today, we can hold off.  As I mentioned last time I recommend taking the Meclizine BID for 2 weeks then prn.

## 2016-02-14 NOTE — Telephone Encounter (Signed)
FYI is this okay to still cancel her appt for Vestibular Rehab?

## 2016-02-14 NOTE — Telephone Encounter (Signed)
Pt stated she does not want to do PT unless it comes back so canceled referral

## 2016-02-21 ENCOUNTER — Other Ambulatory Visit: Payer: Self-pay

## 2016-02-21 ENCOUNTER — Telehealth: Payer: Self-pay

## 2016-02-21 MED ORDER — SIMVASTATIN 40 MG PO TABS: 40.0000 mg | ORAL_TABLET | Freq: Every day | ORAL | Status: DC

## 2016-02-21 MED ORDER — LOSARTAN POTASSIUM-HCTZ 50-12.5 MG PO TABS: 1.0000 | ORAL_TABLET | Freq: Every day | ORAL | Status: DC

## 2016-02-21 NOTE — Telephone Encounter (Signed)
Patient called the office to let us know that her mail order service has changed from [facility] to [facility]. Phone # is [phone], Needs 90 day supply of Simvastatin and losartan/HCTZ sent to new mail order. She has 7 days of medications left. Pharmacy information has been entered. Trixie Rude/RLB

## 2016-02-21 NOTE — Telephone Encounter (Signed)
Sent meds in

## 2016-05-25 ENCOUNTER — Other Ambulatory Visit: Payer: Self-pay | Admitting: Family Medicine

## 2016-06-07 ENCOUNTER — Encounter: Payer: Self-pay | Admitting: Family Medicine

## 2016-06-07 ENCOUNTER — Ambulatory Visit (INDEPENDENT_AMBULATORY_CARE_PROVIDER_SITE_OTHER): Payer: Medicare Other | Admitting: Family Medicine

## 2016-06-07 VITALS — BP 106/70 | HR 76 | Wt 208.2 lb

## 2016-06-07 DIAGNOSIS — M79662 Pain in left lower leg: Secondary | ICD-10-CM

## 2016-06-07 NOTE — Progress Notes (Signed)
   Subjective:    Patient ID: Phyllis Campbell, female    DOB: September 27, 1932, 82 y.o.   MRN: [id number]  HPI She complains of a one-day history of left medial calf pain. She has not injured earache, been traveling, been sedentary. There is no associated chest pain, shortness of breath, swelling or palpable tenderness.   Review of Systems     Objective:   Physical Exam Alert and in no distress. Homans sign negative. No tenderness to the medial calf area near the shin. No tenderness over the shin. Skin is warm and dry.       Assessment & Plan:  Calf pain, left Recommend conservative care with heat, Aleve. She will call if continued difficulty.

## 2016-06-07 NOTE — Patient Instructions (Signed)
Heat to your leg for about 20 minutes 3 times a day. Take 2 Aleve twice a day. Do this for about 10 days. If no better come back

## 2016-06-13 ENCOUNTER — Ambulatory Visit (INDEPENDENT_AMBULATORY_CARE_PROVIDER_SITE_OTHER): Payer: Medicare Other | Admitting: Family Medicine

## 2016-06-13 ENCOUNTER — Encounter: Payer: Self-pay | Admitting: Family Medicine

## 2016-06-13 VITALS — BP 120/68 | HR 66 | Ht 65.0 in | Wt 210.0 lb

## 2016-06-13 DIAGNOSIS — Z23 Encounter for immunization: Secondary | ICD-10-CM | POA: Diagnosis not present

## 2016-06-13 DIAGNOSIS — E785 Hyperlipidemia, unspecified: Secondary | ICD-10-CM | POA: Diagnosis not present

## 2016-06-13 DIAGNOSIS — E1159 Type 2 diabetes mellitus with other circulatory complications: Secondary | ICD-10-CM | POA: Diagnosis not present

## 2016-06-13 DIAGNOSIS — E118 Type 2 diabetes mellitus with unspecified complications: Secondary | ICD-10-CM

## 2016-06-13 DIAGNOSIS — I10 Essential (primary) hypertension: Secondary | ICD-10-CM

## 2016-06-13 DIAGNOSIS — E1169 Type 2 diabetes mellitus with other specified complication: Secondary | ICD-10-CM | POA: Diagnosis not present

## 2016-06-13 LAB — POCT URINALYSIS DIPSTICK
Bilirubin, UA: NEGATIVE
Glucose, UA: NEGATIVE
Ketones, UA: NEGATIVE
Leukocytes, UA: NEGATIVE
NITRITE UA: NEGATIVE
PROTEIN UA: NEGATIVE
RBC UA: NEGATIVE
UROBILINOGEN UA: NEGATIVE
pH, UA: 6

## 2016-06-13 NOTE — Progress Notes (Signed)
Subjective:   HPI  Phyllis Campbell is a 82 y.o. female who presents for a complete physical.  Medical care team includes:     Preventative care: Last ophthalmology visit:11/2015 Last dental visit:NA Last colonoscopy:Patient does not want one Last mammogram:06/20/12 Last gynecological exam:N/A Last EKG:12/27/11 Last labs:08/29/15  Prior vaccinations: TD or Tdap:05/28/07 Influenza:08/16/15 Pneumococcal:23:10/19/13 05/12/06 13: 08/29/15 Shingles/Zostavax:05/12/06  Advanced directive:No. Information given. Concerns: No particular concerns. She is mainly dealing with medical issues revolving around her husband. 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 She shows no cognitive deficits or evidence of depression. She does have underlying diabetes and was seen recently for this. She is doing quite well with this. She continues on her losartan for treatment of her blood pressure and having no difficulty with that medication. Also taking simvastatin and not having any muscle aches or pains. Reviewed their medical, surgical, family, social, medication, and allergy history and updated chart as appropriate.    Review of Systems Constitutional: -fever, -chills, -sweats, -unexpected weight change, -decreased appetite, -fatigue Allergy: -sneezing, -itching, -congestion Dermatology: -changing moles, --rash, Cardiology: -chest pain, -palpitations, -swelling, -difficulty breathing when lying flat, -waking up short of breath Respiratory: -cough, -shortness of breath, -difficulty breathing with exercise or exertion, -wheezing, -coughing up blood Gastroenterology: -abdominal pain, -nausea, -vomiting, -diarrhea, -constipation, -blood in stool, -changes in bowel movement, -difficulty swallowing or eating   Musculoskeletal: -joint aches, -muscle aches, -joint swelling, -back pain, -neck pain, -cramping, -changes in gait Ophthalmology: denies vision changes,  eye redness, itching, discharge Neurology: -headache, -weakness, -tingling, -numbness, -memory loss, -falls, -dizziness Psychology: -depressed mood, -agitation, -sleep problems     Objective:   Physical Exam  General appearance: alert, no distress, WD/WN,  Skin: Normal HEENT: normocephalic, conjunctiva/corneas normal, sclerae anicteric, PERRLA, EOMi, nares patent, no discharge or erythema, pharynx normal Oral cavity: MMM, tongue normal, teeth normal  Heart: RRR, normal S1, S2, no murmurs Lungs: CTA bilaterally, no wheezes, rhonchi, or rales Abdomen: +bs, soft, non tender, non distended, no masses, no hepatomegaly, no splenomegaly, no bruits Neurological: alert, oriented x 3, CN2-12 intact, strength normal upper extremities and lower extremities, sensation normal throughout, DTRs 2+ throughout, no cerebellar signs, gait normal Psychiatric: normal affect, behavior normal, pleasant     Assessment and Plan :   Need for prophylactic vaccination and inoculation against influenza - Plan: Flu vaccine HIGH DOSE PF (Fluzone High dose)  Diabetes mellitus with complication (HCC) - Plan: POCT Urinalysis Dipstick, VAS US ABI WITH/WO TBI  Hypertension complicating diabetes (HCC)  Hyperlipidemia associated with type 2 diabetes mellitus (HCC)  Physical exam - discussed healthy lifestyle, diet, exercise, preventative care, vaccinations, and addressed their concerns.    Overall she seems to be taking fairly good care of herself.  Follow-up 4 months for routine diabetes care

## 2016-08-28 ENCOUNTER — Encounter: Payer: Self-pay | Admitting: Family Medicine

## 2016-09-04 ENCOUNTER — Other Ambulatory Visit: Payer: Self-pay

## 2016-09-04 ENCOUNTER — Telehealth: Payer: Self-pay | Admitting: Family Medicine

## 2016-09-04 MED ORDER — SIMVASTATIN 40 MG PO TABS: 40.0000 mg | ORAL_TABLET | Freq: Every day | ORAL | 0 refills | Status: DC

## 2016-09-04 MED ORDER — LOSARTAN POTASSIUM-HCTZ 50-12.5 MG PO TABS: 1.0000 | ORAL_TABLET | Freq: Every day | ORAL | 0 refills | Status: DC

## 2016-09-04 NOTE — Telephone Encounter (Signed)
Rcvd refill request for Losartan & Zocor

## 2016-09-04 NOTE — Telephone Encounter (Signed)
meds sent

## 2016-10-23 ENCOUNTER — Encounter: Payer: Self-pay | Admitting: Family Medicine

## 2016-10-23 ENCOUNTER — Ambulatory Visit (INDEPENDENT_AMBULATORY_CARE_PROVIDER_SITE_OTHER): Payer: Medicare Other | Admitting: Family Medicine

## 2016-10-23 VITALS — BP 120/70 | HR 68 | Wt 207.0 lb

## 2016-10-23 DIAGNOSIS — E118 Type 2 diabetes mellitus with unspecified complications: Secondary | ICD-10-CM

## 2016-10-23 DIAGNOSIS — Z6379 Other stressful life events affecting family and household: Secondary | ICD-10-CM

## 2016-10-23 DIAGNOSIS — I10 Essential (primary) hypertension: Secondary | ICD-10-CM | POA: Diagnosis not present

## 2016-10-23 DIAGNOSIS — E1169 Type 2 diabetes mellitus with other specified complication: Secondary | ICD-10-CM

## 2016-10-23 DIAGNOSIS — I152 Hypertension secondary to endocrine disorders: Secondary | ICD-10-CM

## 2016-10-23 DIAGNOSIS — E785 Hyperlipidemia, unspecified: Secondary | ICD-10-CM | POA: Diagnosis not present

## 2016-10-23 DIAGNOSIS — B070 Plantar wart: Secondary | ICD-10-CM

## 2016-10-23 DIAGNOSIS — E1159 Type 2 diabetes mellitus with other circulatory complications: Secondary | ICD-10-CM

## 2016-10-23 LAB — CBC WITH DIFFERENTIAL/PLATELET
BASOS ABS: 0 {cells}/uL (ref 0–200)
Basophils Relative: 0 %
EOS PCT: 1 %
Eosinophils Absolute: 47 cells/uL (ref 15–500)
HEMATOCRIT: 38.1 % (ref 35.0–45.0)
Hemoglobin: 12.7 g/dL (ref 11.7–15.5)
LYMPHS PCT: 40 %
Lymphs Abs: 1880 cells/uL (ref 850–3900)
MCH: 30.9 pg (ref 27.0–33.0)
MCHC: 33.3 g/dL (ref 32.0–36.0)
MCV: 92.7 fL (ref 80.0–100.0)
MONO ABS: 329 {cells}/uL (ref 200–950)
MPV: 10.5 fL (ref 7.5–12.5)
Monocytes Relative: 7 %
NEUTROS PCT: 52 %
Neutro Abs: 2444 cells/uL (ref 1500–7800)
Platelets: 212 10*3/uL (ref 140–400)
RBC: 4.11 MIL/uL (ref 3.80–5.10)
RDW: 14.3 % (ref 11.0–15.0)
WBC: 4.7 10*3/uL (ref 4.0–10.5)

## 2016-10-23 LAB — COMPREHENSIVE METABOLIC PANEL
ALBUMIN: 4.2 g/dL (ref 3.6–5.1)
ALT: 17 U/L (ref 6–29)
AST: 22 U/L (ref 10–35)
Alkaline Phosphatase: 71 U/L (ref 33–130)
BUN: 14 mg/dL (ref 7–25)
CALCIUM: 9.9 mg/dL (ref 8.6–10.4)
CHLORIDE: 105 mmol/L (ref 98–110)
CO2: 28 mmol/L (ref 20–31)
Creat: 0.79 mg/dL (ref 0.60–0.88)
Glucose, Bld: 96 mg/dL (ref 65–99)
Potassium: 4 mmol/L (ref 3.5–5.3)
Sodium: 141 mmol/L (ref 135–146)
Total Bilirubin: 0.8 mg/dL (ref 0.2–1.2)
Total Protein: 7.4 g/dL (ref 6.1–8.1)

## 2016-10-23 LAB — LIPID PANEL
CHOL/HDL RATIO: 2 ratio (ref <5.0)
Cholesterol: 124 mg/dL (ref <200)
HDL: 63 mg/dL (ref >50)
LDL CALC: 48 mg/dL (ref <100)
TRIGLYCERIDES: 63 mg/dL (ref <150)
VLDL: 13 mg/dL (ref <30)

## 2016-10-23 LAB — POCT UA - MICROALBUMIN
Albumin/Creatinine Ratio, Urine, POC: 12.7
Creatinine, POC: 144.6 mg/dL
Microalbumin Ur, POC: 18.3 mg/L

## 2016-10-23 LAB — POCT GLYCOSYLATED HEMOGLOBIN (HGB A1C): HEMOGLOBIN A1C: 6.7

## 2016-10-23 MED ORDER — SIMVASTATIN 40 MG PO TABS: 40.0000 mg | ORAL_TABLET | Freq: Every day | ORAL | 3 refills | Status: DC

## 2016-10-23 MED ORDER — LOSARTAN POTASSIUM-HCTZ 50-12.5 MG PO TABS: 1.0000 | ORAL_TABLET | Freq: Every day | ORAL | 3 refills | Status: DC

## 2016-10-23 NOTE — Addendum Note (Signed)
Addended by: Lavell IslamHOTON, Loredana Medellin M on: 10/23/2016 11:08 AM   Modules accepted: Orders

## 2016-10-23 NOTE — Progress Notes (Signed)
  Subjective:    Patient ID: Phyllis Campbell, female    DOB: Mar 21, 1932, 83 y.o.   MRN: [id number]  Phyllis Campbell is a 83 y.o. female who presents for follow-up of Type 2 diabetes mellitus.  Patient is checking home blood sugars.   Home blood sugar records: 97 How often is blood sugars being checked: **1 a month Current symptoms/problems none Daily foot checks: yes  Any foot concerns: knot on right foot Last eye exam:7/17 Exercise: none . She is no longer working. She continues on her Zocor as well as losartan and having no difficulties with them. Her husband is now in hospice and it apparently is now day-to-day. She seems to be handling this fairly well. They have been married for almost 59 years.  The following portions of the patient's history were reviewed and updated as appropriate: allergies, current medications, past medical history, past social history and problem list.  ROS as in subjective above.     Objective:    Physical Exam Alert and in no distress .Plantar wart noted over the right heel area. Lab Review Diabetic Labs Latest Ref Rng & Units 10/23/2016 01/13/2016 08/29/2015 04/20/2015 08/10/2014  HbA1c - 6.7 6.3 6.1 6.1(H) 6.4  Microalbumin mg/L - - 6.3 - 10.2  Micro/Creat Ratio - - - 7.1 - 6.4  Chol 125 - 200 mg/dL - - 409(W109(L) - -  HDL >=11>=46 mg/dL - - 59 - -  Calc LDL <914<130 mg/dL - - 38 - -  Triglycerides <150 mg/dL - - 59 - -  Creatinine 0.60 - 0.88 mg/dL - - 7.820.70 - -   BP/Weight 10/23/2016 06/13/2016 06/07/2016 02/08/2016 01/13/2016  Systolic BP 120 120 106 108 128  Diastolic BP 70 68 70 70 70  Wt. (Lbs) 207 210 208.2 201 210  BMI 34.45 34.95 34.65 33.45 34.95   Foot/eye exam completion dates Latest Ref Rng & Units 11/28/2015 08/29/2015  Eye Exam No Retinopathy No Retinopathy -  Foot Form Completion - - Done  A1c is 6.7  Phyllis Campbell  reports that she has never smoked. She has never used smokeless tobacco. She reports that she does not drink alcohol or use drugs.       Assessment & Plan:    Diabetes mellitus with complication (HCC) - Plan: HgB A1c, CBC with Differential/Platelet, Comprehensive metabolic panel, Lipid panel, CANCELED: POCT UA - Microalbumin  Hypertension complicating diabetes (HCC) - Plan: CBC with Differential/Platelet, Comprehensive metabolic panel, losartan-hydrochlorothiazide (HYZAAR) 50-12.5 MG tablet  Hyperlipidemia associated with type 2 diabetes mellitus (HCC) - Plan: Lipid panel, simvastatin (ZOCOR) 40 MG tablet  Stress due to illness of family member  Plantar wart, right foot   1. Rx changes: none 2. Education: Reviewed 'ABCs' of diabetes management (respective goals in parentheses):  A1C (<7), blood pressure (<130/80), and cholesterol (LDL <100). 3. Compliance at present is estimated to be fair. Efforts to improve compliance (if necessary) will be directed at No change.. 4. Follow up: 4 months Consoled her in regard to the impending death of her husband. Also instructed her on proper use of Compound W to help with the wart.

## 2016-11-01 ENCOUNTER — Telehealth: Payer: Self-pay | Admitting: Family Medicine

## 2016-11-01 NOTE — Telephone Encounter (Signed)
Sympathy card sent 

## 2016-12-06 ENCOUNTER — Ambulatory Visit (INDEPENDENT_AMBULATORY_CARE_PROVIDER_SITE_OTHER): Payer: Medicare Other | Admitting: Family Medicine

## 2016-12-06 ENCOUNTER — Encounter: Payer: Self-pay | Admitting: Family Medicine

## 2016-12-06 VITALS — BP 140/80 | HR 87 | Wt 211.0 lb

## 2016-12-06 DIAGNOSIS — F4329 Adjustment disorder with other symptoms: Secondary | ICD-10-CM | POA: Diagnosis not present

## 2016-12-06 DIAGNOSIS — R0683 Snoring: Secondary | ICD-10-CM | POA: Diagnosis not present

## 2016-12-06 DIAGNOSIS — R5383 Other fatigue: Secondary | ICD-10-CM

## 2016-12-06 LAB — POCT URINALYSIS DIPSTICK
BILIRUBIN UA: NEGATIVE
Glucose, UA: NEGATIVE
Ketones, UA: NEGATIVE
Leukocytes, UA: NEGATIVE
Nitrite, UA: NEGATIVE
Protein, UA: NEGATIVE
RBC UA: NEGATIVE
SPEC GRAV UA: 1.02
Urobilinogen, UA: NEGATIVE
pH, UA: 7.5

## 2016-12-06 NOTE — Progress Notes (Signed)
   Subjective:    Patient ID: Phyllis Campbell, female    DOB: [birth year], 83 y.o.   MRN: [id number]  HPI She is here with her daughter for consult concerning difficulty with snoring, fatigue, waking up tired. There've also been 2 occasions where she was forgetful in terms of driving and forgetting where she was going. They have noticed this over the last 2 weeks however her husband died in early January and people are saying with her now. Further questioning of her indicates that she is had difficulty with fatigue, waking up tired for a much longer period of time, as much as a year.    Review of Systems     Objective:   Physical Exam Alert and in no distress. Tympanic membranes and canals are normal. Pharyngeal area is normal. Neck is supple without adenopathy or thyromegaly. Cardiac exam shows a regular sinus rhythm without murmurs or gallops. Lungs are clear to auscultation. She has had recent blood work which was normal. Epworth scale was 17. Mini-Mental Status 28       Assessment & Plan:  Stress and adjustment reaction  Snoring - Plan: Nocturnal polysomnography  Fatigue, unspecified type - Plan: Nocturnal polysomnography, POCT Urinalysis Dipstick I think the symptoms are much more prominent now since there are other people around 2 are observing this. Although the stress of her husband is playing a role, I think there is also possibility of underlying sleep apnea. I also encouraged her to become involved with hospice concerning bereavement counseling.

## 2016-12-18 ENCOUNTER — Telehealth: Payer: Self-pay | Admitting: Family Medicine

## 2016-12-18 NOTE — Telephone Encounter (Signed)
Pt came in and dropped off a disability parking placard. Please complete and call pt at [phone] when ready.

## 2016-12-26 ENCOUNTER — Telehealth: Payer: Self-pay | Admitting: Family Medicine

## 2016-12-26 NOTE — Telephone Encounter (Signed)
Needs an appointment to further evaluate the leg issues

## 2016-12-26 NOTE — Telephone Encounter (Signed)
Called pt to tell her Dr. Susann GivensLalonde states she does not need the handicap placard.  She states he must of forgot.  That when I go to the store, I can't walk very far without stopping, then when I stop my leg goes numb to sleep and then I feel like I am going to pass out .  When she gets in the store she rides the scooter around to get her groceries.  She said the surgeon wanted to remove a bone from her back and gave her two shots in her back.  She does not want any surgery.  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  She would like you to reconsider?

## 2016-12-27 NOTE — Telephone Encounter (Signed)
Pt has appointment 3/26

## 2016-12-31 ENCOUNTER — Encounter: Payer: Self-pay | Admitting: Family Medicine

## 2016-12-31 ENCOUNTER — Ambulatory Visit (INDEPENDENT_AMBULATORY_CARE_PROVIDER_SITE_OTHER): Payer: Medicare Other | Admitting: Family Medicine

## 2016-12-31 VITALS — BP 120/80 | HR 78 | Wt 211.8 lb

## 2016-12-31 DIAGNOSIS — G8929 Other chronic pain: Secondary | ICD-10-CM | POA: Diagnosis not present

## 2016-12-31 DIAGNOSIS — M1990 Unspecified osteoarthritis, unspecified site: Secondary | ICD-10-CM | POA: Diagnosis not present

## 2016-12-31 DIAGNOSIS — M545 Low back pain, unspecified: Secondary | ICD-10-CM | POA: Insufficient documentation

## 2016-12-31 NOTE — Progress Notes (Signed)
   Subjective:    Patient ID: Phyllis JulianVernia H Bink, female    DOB: July 13, 1932, 83 y.o.   MRN: [id number]  HPI She is here for consult concerning getting a disability placard. She has a previous history of difficulty with back pain and subsequent evaluation including MRI. She was given 3 epidural injections to help with her back pain but states it is not been helpful. He has also gone through physical therapy and again it did help minimally but she still having pain. She notes this especially when she is up walking around does get referred pain down to her knee. This does interfere with her ability to ambulate.   Review of Systems     Objective:   Physical Exam Alert and in no distress otherwise not examined       Assessment & Plan:  Arthritis  Chronic low back pain, unspecified back pain laterality, with sciatica presence unspecified With this history of back pain with failed epidural I think it's appropriate to give her permanent placard.

## 2017-02-19 ENCOUNTER — Encounter: Payer: Self-pay | Admitting: Family Medicine

## 2017-02-19 ENCOUNTER — Ambulatory Visit (INDEPENDENT_AMBULATORY_CARE_PROVIDER_SITE_OTHER): Payer: Medicare Other | Admitting: Family Medicine

## 2017-02-19 VITALS — BP 114/70 | HR 68 | Ht 65.0 in | Wt 212.0 lb

## 2017-02-19 DIAGNOSIS — I10 Essential (primary) hypertension: Secondary | ICD-10-CM

## 2017-02-19 DIAGNOSIS — E1169 Type 2 diabetes mellitus with other specified complication: Secondary | ICD-10-CM | POA: Diagnosis not present

## 2017-02-19 DIAGNOSIS — E1159 Type 2 diabetes mellitus with other circulatory complications: Secondary | ICD-10-CM | POA: Diagnosis not present

## 2017-02-19 DIAGNOSIS — G8929 Other chronic pain: Secondary | ICD-10-CM | POA: Diagnosis not present

## 2017-02-19 DIAGNOSIS — M1990 Unspecified osteoarthritis, unspecified site: Secondary | ICD-10-CM

## 2017-02-19 DIAGNOSIS — M545 Low back pain: Secondary | ICD-10-CM

## 2017-02-19 DIAGNOSIS — E119 Type 2 diabetes mellitus without complications: Secondary | ICD-10-CM

## 2017-02-19 DIAGNOSIS — E118 Type 2 diabetes mellitus with unspecified complications: Secondary | ICD-10-CM | POA: Diagnosis not present

## 2017-02-19 DIAGNOSIS — J301 Allergic rhinitis due to pollen: Secondary | ICD-10-CM

## 2017-02-19 DIAGNOSIS — E785 Hyperlipidemia, unspecified: Secondary | ICD-10-CM

## 2017-02-19 LAB — POCT GLYCOSYLATED HEMOGLOBIN (HGB A1C): HEMOGLOBIN A1C: 6.6

## 2017-02-19 NOTE — Progress Notes (Signed)
  Subjective:    Patient ID: Phyllis Campbell, female    DOB: 06-26-1932, 83 y.o.   MRN: [id number]  Phyllis Campbell is a 83 y.o. female who presents for follow-up of Type 2 diabetes mellitus.  Patient is  checking home blood sugars.   Home blood sugar records: Avg 98 How often is blood sugars being checked: every other week Current symptoms/problems None Daily foot checks: Yes   Any foot concerns: None Last eye exam:  01/27/17 Exercise: all the time She does complain of continued difficulty with various aches and pains that are arthritic in nature. She is now using a topical preparation for that. She continues on simvastatin for treatment of her lipid panel. She is also taking losartan for her blood pressure. Her allergies are under good control with cetirizine. She continues to have some chronic low back pain but keeps herself quite active and exercises regularly for that. She has a previous history of PAD dating back to 2013 but presently is having no leg pains or exertional type symptoms. The following portions of the patient's history were reviewed and updated as appropriate: allergies, current medications, past medical history, past social history and problem list.  ROS as in subjective above.     Objective:    Physical Exam Alert and in no distress otherwise not examined.  Lab Review Diabetic Labs Latest Ref Rng & Units 02/19/2017 10/23/2016 01/13/2016 08/29/2015 04/20/2015  HbA1c - 6.6 6.7 6.3 6.1 6.1(H)  Microalbumin mg/L - 18.3 - 6.3 -  Micro/Creat Ratio - - 12.7 - 7.1 -  Chol <200 mg/dL - 409124 - 811(B109(L) -  HDL >14>50 mg/dL - 63 - 59 -  Calc LDL <782<100 mg/dL - 48 - 38 -  Triglycerides <150 mg/dL - 63 - 59 -  Creatinine 0.60 - 0.88 mg/dL - 9.560.79 - 2.130.70 -   BP/Weight 02/19/2017 12/31/2016 12/06/2016 10/23/2016 06/13/2016  Systolic BP 114 120 140 120 120  Diastolic BP 70 80 80 70 68  Wt. (Lbs) 212 211.8 211 207 210  BMI 35.28 35.25 35.11 34.45 34.95   Foot/eye exam completion dates Latest Ref  Rng & Units 11/28/2015 08/29/2015  Eye Exam No Retinopathy No Retinopathy -  Foot Form Completion - - Done   A1c is 6.6 Phyllis Campbell  reports that she has never smoked. She has never used smokeless tobacco. She reports that she does not drink alcohol or use drugs.     Assessment & Plan:    DM (diabetes mellitus) with complications (HCC) - Plan: HgB A1c  Non-seasonal allergic rhinitis due to pollen  Arthritis  Hyperlipidemia associated with type 2 diabetes mellitus (HCC)  Hypertension complicating diabetes (HCC)  Chronic low back pain, unspecified back pain laterality, with sciatica presence unspecified  1. Rx changes: none 2. Education: Reviewed 'ABCs' of diabetes management (respective goals in parentheses):  A1C (<7), blood pressure (<130/80), and cholesterol (LDL <100). 3. Compliance at present is estimated to be good. Efforts to improve compliance (if necessary) will be directed at  4. Follow up: 4 months Also encouraged her to go ahead and get the shingles vaccine as well as TDaP

## 2017-02-19 NOTE — Patient Instructions (Signed)
Try Tylenol for aches and pains and if that doesn't work then Advil

## 2017-03-08 ENCOUNTER — Telehealth: Payer: Self-pay | Admitting: Family Medicine

## 2017-03-08 MED ORDER — MECLIZINE HCL 12.5 MG PO TABS: 12.5000 mg | ORAL_TABLET | Freq: Three times a day (TID) | ORAL | 0 refills | Status: DC | PRN

## 2017-03-08 NOTE — Telephone Encounter (Signed)
Pt come by and states that she needs a refill on her meclizine states that she uses it for her veritgo states that she had a veritgo flare up on the weekend, the over the counter (Ginkgo)stuff that she was using is not working, she would [facility] [place]- [place], [place] - [address] and pt can be reached at [phone] and she said if you call it in if you please call her and let her know please

## 2017-03-13 ENCOUNTER — Telehealth: Payer: Self-pay | Admitting: Family Medicine

## 2017-03-13 NOTE — Telephone Encounter (Signed)
Pt called stating that [facility] informed her that her insurance is not covering the Meclizine 12.5 as well as it used to. She has to pay more for this so pt wants help with this med.

## 2017-03-16 NOTE — Telephone Encounter (Signed)
Initiated P.A Meclizine

## 2017-03-21 NOTE — Telephone Encounter (Signed)
DONE

## 2017-03-21 NOTE — Telephone Encounter (Signed)
P.A. Approved til 10/07/17, pt informed,

## 2017-06-25 ENCOUNTER — Ambulatory Visit: Payer: Medicare Other | Admitting: Family Medicine

## 2017-06-25 ENCOUNTER — Telehealth: Payer: Self-pay

## 2017-06-25 NOTE — Telephone Encounter (Signed)

## 2017-06-25 NOTE — Telephone Encounter (Signed)
Just called find out why she didn't show up and possibly reschedule

## 2017-06-25 NOTE — Telephone Encounter (Signed)
LM for pt to CB

## 2017-06-26 NOTE — Telephone Encounter (Signed)
LMTCB
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
## 2017-07-10 ENCOUNTER — Encounter: Payer: Self-pay | Admitting: Family Medicine

## 2017-07-10 ENCOUNTER — Ambulatory Visit (INDEPENDENT_AMBULATORY_CARE_PROVIDER_SITE_OTHER): Payer: Medicare Other | Admitting: Family Medicine

## 2017-07-10 VITALS — BP 120/76 | HR 67 | Wt 212.6 lb

## 2017-07-10 DIAGNOSIS — E1169 Type 2 diabetes mellitus with other specified complication: Secondary | ICD-10-CM

## 2017-07-10 DIAGNOSIS — E118 Type 2 diabetes mellitus with unspecified complications: Secondary | ICD-10-CM | POA: Diagnosis not present

## 2017-07-10 DIAGNOSIS — E785 Hyperlipidemia, unspecified: Secondary | ICD-10-CM | POA: Diagnosis not present

## 2017-07-10 DIAGNOSIS — I10 Essential (primary) hypertension: Secondary | ICD-10-CM

## 2017-07-10 DIAGNOSIS — Z23 Encounter for immunization: Secondary | ICD-10-CM

## 2017-07-10 DIAGNOSIS — R200 Anesthesia of skin: Secondary | ICD-10-CM

## 2017-07-10 DIAGNOSIS — E1159 Type 2 diabetes mellitus with other circulatory complications: Secondary | ICD-10-CM

## 2017-07-10 LAB — POCT GLYCOSYLATED HEMOGLOBIN (HGB A1C): HEMOGLOBIN A1C: 6.4

## 2017-07-10 NOTE — Progress Notes (Signed)
  Subjective:    Patient ID: Phyllis Campbell, female    DOB: 12/20/1931, 83 y.o.   MRN: [id number]  Phyllis Campbell is a 83 y.o. female who presents for follow-up of Type 2 diabetes mellitus.  Patient is checking home blood sugars.   Home blood sugar records: yes In the low 100s  How often is blood sugars being checked: once a week  Current symptoms/problems include numbness in feet and have been unchanged. Daily foot checks: yes Any foot concerns: yes numbness Last eye exam 2017 Exercise: yes go to [facility] doing water aerobics daily. She continues on her simvastatin is having no aches or pains with that. She is also taking Hyzaar without difficulty. She does note intermittent difficulty with tingling sensation in her lower extremity as it does tend to go away. It is associated mainly with physical activities. Her left foot tends to bother her more than the right. The following portions of the patient's history were reviewed and updated as appropriate: allergies, current medications, past medical history, past social history and problem list.  ROS as in subjective above.     Objective:    Physical Exam Alert and in no distress. Foot exam shows normal sensation. Pulses were difficult to assess. Skin appears normal. She has had a previous ABI that showed slight difference. Lab Review Diabetic Labs Latest Ref Rng & Units 02/19/2017 10/23/2016 01/13/2016 08/29/2015 04/20/2015  HbA1c - 6.6 6.7 6.3 6.1 6.1(H)  Microalbumin mg/L - 18.3 - 6.3 -  Micro/Creat Ratio - - 12.7 - 7.1 -  Chol <200 mg/dL - 409 - 811(B) -  HDL >14 mg/dL - 63 - 59 -  Calc LDL <782 mg/dL - 48 - 38 -  Triglycerides <150 mg/dL - 63 - 59 -  Creatinine 0.60 - 0.88 mg/dL - 9.56 - 2.13 -   BP/Weight 02/19/2017 12/31/2016 12/06/2016 10/23/2016 06/13/2016  Systolic BP 114 120 140 120 120  Diastolic BP 70 80 80 70 68  Wt. (Lbs) 212 211.8 211 207 210  BMI 35.28 35.25 35.11 34.45 34.95   Foot/eye exam completion dates Latest Ref Rng &  Units 11/28/2015 08/29/2015  Eye Exam No Retinopathy No Retinopathy -  Foot Form Completion - - Done  Clarie  reports that she has never smoked. She has never used smokeless tobacco. She reports that she does not drink alcohol or use drugs.  hemoglobin A1c is 6.4     Assessment & Plan:    DM (diabetes mellitus) with complications (HCC) - Plan: HgB A1c, VAS [place] ABI WITH/WO TBI  Need for influenza vaccination - Plan: Flu vaccine HIGH DOSE PF (Fluzone High dose)  Hyperlipidemia associated with type 2 diabetes mellitus (HCC)  Hypertension complicating diabetes (HCC)  Leg numbness - Plan: VAS [place] ABI WITH/WO TBI   1. Rx changes: none 2. Education: Reviewed 'ABCs' of diabetes management (respective goals in parentheses):  A1C (<7), blood pressure (<130/80), and cholesterol (LDL <100). 3. Compliance at present is estimated to be good. Efforts to improve compliance (if necessary) will be directed at Note change. 4. Follow up: 4 months  I will order another ABI and possibly refer if it is abnormal.

## 2017-07-12 ENCOUNTER — Ambulatory Visit (HOSPITAL_COMMUNITY)
Admission: RE | Admit: 2017-07-12 | Discharge: 2017-07-12 | Disposition: A | Discharge status: Home / Self Care | Payer: Medicare Other | Source: Ambulatory Visit | Attending: Family Medicine | Admitting: Family Medicine

## 2017-07-12 DIAGNOSIS — R200 Anesthesia of skin: Secondary | ICD-10-CM | POA: Diagnosis not present

## 2017-07-12 DIAGNOSIS — E118 Type 2 diabetes mellitus with unspecified complications: Secondary | ICD-10-CM | POA: Insufficient documentation

## 2017-07-12 DIAGNOSIS — R9389 Abnormal findings on diagnostic imaging of other specified body structures: Secondary | ICD-10-CM | POA: Insufficient documentation

## 2017-07-12 NOTE — Progress Notes (Signed)
VASCULAR LAB PRELIMINARY  ARTERIAL  ABI completed:    RIGHT    LEFT    PRESSURE WAVEFORM  PRESSURE WAVEFORM  BRACHIAL 153 Triphasic BRACHIAL 158 Triphasic  DP 143 Triphasic DP 130 Triphasic  PT 154 Triphasic PT 121 Biphasic  GREAT TOE 84 NA GREAT TOE 95 NA    RIGHT LEFT  ABI / TBI 0.97 / 0.53 0.82 / 0.61   Right - ABI indicates normal arterial flow at  Rest. TBI is abnormal. Left ABI indicates a mild reduction in arterial flow at rest. TBI is abnormal.  Phyllis Campbell, RVS 07/12/2017, 9:48 AM

## 2017-07-15 ENCOUNTER — Other Ambulatory Visit: Payer: Self-pay | Admitting: Family Medicine

## 2017-07-15 DIAGNOSIS — R6889 Other general symptoms and signs: Secondary | ICD-10-CM

## 2017-07-19 ENCOUNTER — Encounter: Payer: Self-pay | Admitting: Vascular Surgery

## 2017-07-19 ENCOUNTER — Ambulatory Visit (INDEPENDENT_AMBULATORY_CARE_PROVIDER_SITE_OTHER): Payer: Medicare Other | Admitting: Vascular Surgery

## 2017-07-19 VITALS — BP 130/68 | HR 76 | Temp 97.8°F | Resp 18 | Ht 65.0 in | Wt 213.0 lb

## 2017-07-19 DIAGNOSIS — I70213 Atherosclerosis of native arteries of extremities with intermittent claudication, bilateral legs: Secondary | ICD-10-CM | POA: Diagnosis not present

## 2017-07-19 NOTE — Progress Notes (Signed)
Requested by:  Ronnald Nian, MD [address] [place], [place] [address]  Reason for consultation: abnormal ABI   History of Present Illness   Phyllis Campbell is a 83 y.o. (05-Jul-1932) female with DM2 who presents with chief complaint: bilateral leg numbness.  Onset of symptom occurred over last year, without obvious trigger.  Patient denies classic intermittent claudication sx.  Pt notes her numbness is intermittent without obvious trigger pattern.  The patient has no rest pain symptoms also and no leg wounds/ulcers.   Atherosclerotic risk factors include: DM, HTN, HLD.  Pt also notes some back pain.  Past Medical History:  Diagnosis Date  . Allergy   . Arthritis   . Cataracts, bilateral   . Diabetes mellitus   . Dyslipidemia   . HH (hiatus hernia)   . Hypertension   . Obesity   . Postmenopausal   . Thyromegaly     Past Surgical History:  Procedure Laterality Date  . ABDOMINAL HYSTERECTOMY    . cataracts    . CESAREAN SECTION    . COLONOSCOPY  2004  . EYE SURGERY    . SHOULDER SURGERY      Social History   Social History  . Marital status: Married    Spouse name: N/A  . Number of children: N/A  . Years of education: N/A   Occupational History  . Not on file.   Social History Main Topics  . Smoking status: Never Smoker  . Smokeless tobacco: Never Used  . Alcohol use No  . Drug use: No  . Sexual activity: Not Currently   Other Topics Concern  . Not on file   Social History Narrative  . No narrative on file    Family History: patient is unable to detail the medical history of his parents   Current Outpatient Prescriptions  Medication Sig Dispense Refill  . aspirin EC 81 MG tablet Take 81 mg by mouth daily.    . Ginkgo Biloba 40 MG TABS Take 60 mg by mouth as needed. Reported on 02/08/2016    . glucose blood test strip Patient test one time a day DX: E11.9 This is for ONE TOUCH VERIO Use as instructed 150 each 4  . ibuprofen (ADVIL,MOTRIN)  200 MG tablet Take 200 mg by mouth every 6 (six) hours as needed. Reported on 02/08/2016    . Liniments (SALONPAS ARTHRITIS PAIN RELIEF EX) Apply topically.    Marland Kitchen losartan-hydrochlorothiazide (HYZAAR) 50-12.5 MG tablet Take 1 tablet by mouth daily. 90 tablet 3  . meclizine (ANTIVERT) 12.5 MG tablet Take 1 tablet (12.5 mg total) by mouth 3 (three) times daily as needed for dizziness. 30 tablet 0  . ONETOUCH DELICA LANCETS FINE MISC 1 each by Does not apply route daily. 100 each 6  . RESTASIS 0.05 % ophthalmic emulsion     . simvastatin (ZOCOR) 40 MG tablet Take 1 tablet (40 mg total) by mouth daily. 90 tablet 3   No current facility-administered medications for this visit.     No Known Allergies  REVIEW OF SYSTEMS (negative unless checked):   Cardiac:   Chest pain or chest pressure?  Shortness of breath upon activity?  Shortness of breath when lying flat?  Irregular heart rhythm?  Vascular:   Pain in calf, thigh, or hip brought on by walking?  Pain in feet at night that wakes you up from your sleep?  Blood clot in your veins?  Leg swelling?  Pulmonary:   Oxygen at  home?  Productive cough?  Wheezing?  Neurologic:   Sudden weakness in arms or legs?  Sudden numbness in arms or legs?  Sudden onset of difficult speaking or slurred speech?  Temporary loss of vision in one eye?  Problems with dizziness?  Gastrointestinal:   Blood in stool?  Vomited blood?  Genitourinary:   Burning when urinating?  Blood in urine?  Psychiatric:   Major depression  Hematologic:   Bleeding problems?  Problems with blood clotting?  Dermatologic:   Rashes or ulcers?  Constitutional:   Fever or chills?  Ear/Nose/Throat:   Change in hearing?  Nose bleeds?  Sore throat?  Musculoskeletal:   Back pain?  Joint pain?  Muscle pain?   For VQI Use Only   PRE-ADM LIVING Home  AMB STATUS Ambulatory  CAD Sx None  PRIOR CHF  None  STRESS TEST No   Physical Examination     Vitals:   07/19/17 1436  BP: 130/68  Pulse: 76  Resp: 18  Temp: 97.8 F (36.6 C)  TempSrc: Oral  SpO2: 98%  Weight: 213 lb (96.6 kg)  Height:  (1.651 m)   Body mass index is 35.45 kg/m.  General Alert, O x 3, WD, NAD  Head [place]/AT,    Ear/Nose/ Throat Hearing grossly intact, nares without erythema or drainage, oropharynx without Erythema or Exudate, Mallampati score: 3,   Eyes PERRLA, EOMI,    Neck Supple, mid-line trachea,    Pulmonary Sym exp, good B air movt, CTA B  Cardiac RRR, Nl S1, S2, no Murmurs, No rubs, No S3,S4  Vascular Vessel Right Left  Radial Palpable Palpable  Brachial Palpable Palpable  Carotid Palpable, No Bruit Palpable, No Bruit  Aorta Not palpable N/A  Femoral Palpable Palpable  Popliteal Not palpable Not palpable  PT Palpable Palpable  DP Palpable Palpable    Gastro- intestinal soft, non-distended, non-tender to palpation, No guarding or rebound, no HSM, no masses, no CVAT B, No palpable prominent aortic pulse,    Musculo- skeletal M/S 5/5 throughout  , Extremities without ischemic changes  , No edema present, No visible varicosities , No Lipodermatosclerosis present, minimal hammertoes B  Neurologic Cranial nerves 2-12 intact , Pain and light touch intact in extremities , Motor exam as listed above  Psychiatric Judgement intact, Mood & affect appropriate for pt's clinical situation  Dermatologic See M/S exam for extremity exam, No rashes otherwise noted  Lymphatic  Palpable lymph nodes: None    Non-Invasive Vascular imaging   Outside BLE ABI (07/13/17)  R:   ABI: 0.97,   PT: tri  DP: tri  TBI:  0.53  L:   ABI: 0.82,   PT: bi  DP: tri  TBI: 0.6   Outside Studies/Documentation   3 pages of outside documents were reviewed including: outside PCP records.    Medical Decision Making   Phyllis Campbell is a 83 y.o. female who presents with: BLE intermittent  claudication.   ABI are consistent with minimal R leg PAD and mild to moderate L leg PAD  Based on this patient's history and physical exam, I recommend: maximal medical mgmt and annual ABI.  I discussed with the patient the natural history of intermittent claudication: 75% of patients have stable or improved symptoms in a year an only 2% require amputation. Eventually 20% may require intervention in a year.  I discussed in depth with the patient the nature of atherosclerosis, and emphasized the importance of maximal medical management including strict control of  blood pressure, blood glucose, and lipid levels, antiplatelet agent, obtaining regular exercise, and cessation of smoking.    The patient is aware that without maximal medical management the underlying atherosclerotic disease process will progress, limiting the benefit of any interventions.  I discussed in depth with the patient a walking plan and how to execute such. The patient is currently on a statin: Zocor.  The patient is currently on an anti-platelet: ASA.  Thank you for allowing [place] to participate in this patient's care.   Leonides Sake, MD, FACS [facility] Office: [phone] Pager: [phone]  07/19/2017, 2:31 PM

## 2017-10-03 ENCOUNTER — Other Ambulatory Visit: Payer: Self-pay | Admitting: Family Medicine

## 2017-10-03 DIAGNOSIS — I10 Essential (primary) hypertension: Secondary | ICD-10-CM

## 2017-10-03 DIAGNOSIS — E1159 Type 2 diabetes mellitus with other circulatory complications: Secondary | ICD-10-CM

## 2017-10-03 DIAGNOSIS — E785 Hyperlipidemia, unspecified: Principal | ICD-10-CM

## 2017-10-03 DIAGNOSIS — E1169 Type 2 diabetes mellitus with other specified complication: Secondary | ICD-10-CM

## 2017-11-11 ENCOUNTER — Ambulatory Visit: Payer: Medicare Other | Admitting: Family Medicine

## 2017-11-11 ENCOUNTER — Encounter: Payer: Self-pay | Admitting: Family Medicine

## 2017-11-11 VITALS — BP 136/82 | HR 73 | Wt 209.8 lb

## 2017-11-11 DIAGNOSIS — E1169 Type 2 diabetes mellitus with other specified complication: Secondary | ICD-10-CM | POA: Diagnosis not present

## 2017-11-11 DIAGNOSIS — E785 Hyperlipidemia, unspecified: Secondary | ICD-10-CM | POA: Diagnosis not present

## 2017-11-11 DIAGNOSIS — E118 Type 2 diabetes mellitus with unspecified complications: Secondary | ICD-10-CM

## 2017-11-11 DIAGNOSIS — E669 Obesity, unspecified: Secondary | ICD-10-CM | POA: Diagnosis not present

## 2017-11-11 DIAGNOSIS — I10 Essential (primary) hypertension: Secondary | ICD-10-CM | POA: Diagnosis not present

## 2017-11-11 DIAGNOSIS — E1159 Type 2 diabetes mellitus with other circulatory complications: Secondary | ICD-10-CM

## 2017-11-11 DIAGNOSIS — I152 Hypertension secondary to endocrine disorders: Secondary | ICD-10-CM

## 2017-11-11 LAB — COMPREHENSIVE METABOLIC PANEL
ALT: 15 IU/L (ref 0–32)
AST: 22 IU/L (ref 0–40)
Albumin/Globulin Ratio: 1.4 (ref 1.2–2.2)
Albumin: 4.4 g/dL (ref 3.5–4.7)
Alkaline Phosphatase: 91 IU/L (ref 39–117)
BILIRUBIN TOTAL: 0.6 mg/dL (ref 0.0–1.2)
BUN/Creatinine Ratio: 19 (ref 12–28)
BUN: 15 mg/dL (ref 8–27)
CHLORIDE: 103 mmol/L (ref 96–106)
CO2: 25 mmol/L (ref 20–29)
Calcium: 10 mg/dL (ref 8.7–10.3)
Creatinine, Ser: 0.79 mg/dL (ref 0.57–1.00)
GFR calc Af Amer: 80 mL/min/{1.73_m2} (ref >59)
GFR, EST NON AFRICAN AMERICAN: 69 mL/min/{1.73_m2} (ref >59)
GLOBULIN, TOTAL: 3.2 g/dL (ref 1.5–4.5)
Glucose: 104 mg/dL — ABNORMAL HIGH (ref 65–99)
POTASSIUM: 4.3 mmol/L (ref 3.5–5.2)
SODIUM: 143 mmol/L (ref 134–144)
Total Protein: 7.6 g/dL (ref 6.0–8.5)

## 2017-11-11 LAB — POCT UA - MICROALBUMIN
ALBUMIN/CREATININE RATIO, URINE, POC: 16.1
Creatinine, POC: 58.9 mg/dL
Microalbumin Ur, POC: 9.5 mg/L

## 2017-11-11 LAB — LIPID PANEL
CHOL/HDL RATIO: 2.4 ratio (ref 0.0–4.4)
Cholesterol, Total: 132 mg/dL (ref 100–199)
HDL: 56 mg/dL (ref >39)
LDL Calculated: 66 mg/dL (ref 0–99)
TRIGLYCERIDES: 51 mg/dL (ref 0–149)
VLDL Cholesterol Cal: 10 mg/dL (ref 5–40)

## 2017-11-11 LAB — CBC WITH DIFFERENTIAL/PLATELET
Basophils Absolute: 0 10*3/uL (ref 0.0–0.2)
Basos: 0 %
EOS (ABSOLUTE): 0 10*3/uL (ref 0.0–0.4)
EOS: 1 %
HEMATOCRIT: 35.7 % (ref 34.0–46.6)
Hemoglobin: 12.3 g/dL (ref 11.1–15.9)
Immature Grans (Abs): 0 10*3/uL (ref 0.0–0.1)
Immature Granulocytes: 0 %
LYMPHS ABS: 1.5 10*3/uL (ref 0.7–3.1)
Lymphs: 38 %
MCH: 31.1 pg (ref 26.6–33.0)
MCHC: 34.5 g/dL (ref 31.5–35.7)
MCV: 90 fL (ref 79–97)
MONOS ABS: 0.3 10*3/uL (ref 0.1–0.9)
Monocytes: 8 %
Neutrophils Absolute: 2.2 10*3/uL (ref 1.4–7.0)
Neutrophils: 53 %
Platelets: 228 10*3/uL (ref 150–379)
RBC: 3.95 x10E6/uL (ref 3.77–5.28)
RDW: 14.4 % (ref 12.3–15.4)
WBC: 4 10*3/uL (ref 3.4–10.8)

## 2017-11-11 LAB — POCT GLYCOSYLATED HEMOGLOBIN (HGB A1C): HEMOGLOBIN A1C: 6.4

## 2017-11-11 NOTE — Progress Notes (Signed)
  Subjective:    Patient ID: Phyllis Campbell, female    DOB: March 19, 1932, 84 y.o.   MRN: [id number]  Phyllis Campbell is a 84 y.o. female who presents for follow-up of Type 2 diabetes mellitus.  Patient is checking home blood sugars.   Home blood sugar records: 45-100 How often is blood sugars being checked:once a week Current symptoms/problems include none and have been unchanged. Daily foot checks:yes  Any foot concerns:no Last eye exam: last year Exercise: every day for 30 min She continues on her losartan/HCTZ as well as simvastatin and is having no difficulty with them.  She also takes other OTC meds on an as-needed basis.  Life in general seems to be going fairly well for her. The following portions of the patient's history were reviewed and updated as appropriate: allergies, current medications, past medical history, past social history and problem list.  ROS as in subjective above.     Objective:    Physical Exam Alert and in no distress foot exam is recorded and is essentially normal  Lab Review Diabetic Labs Latest Ref Rng & Units 07/10/2017 02/19/2017 10/23/2016 01/13/2016 08/29/2015  HbA1c - 6.4 6.6 6.7 6.3 6.1  Microalbumin mg/L - - 18.3 - 6.3  Micro/Creat Ratio - - - 12.7 - 7.1  Chol <200 mg/dL - - 409124 - 811(B109(L)  HDL >14>50 mg/dL - - 63 - 59  Calc LDL <782<100 mg/dL - - 48 - 38  Triglycerides <150 mg/dL - - 63 - 59  Creatinine 0.60 - 0.88 mg/dL - - 9.560.79 - 2.130.70   BP/Weight 07/19/2017 07/10/2017 02/19/2017 12/31/2016 12/06/2016  Systolic BP 130 120 114 120 140  Diastolic BP 68 76 70 80 80  Wt. (Lbs) 213 212.6 212 211.8 211  BMI 35.45 35.38 35.28 35.25 35.11   Foot/eye exam completion dates Latest Ref Rng & Units 11/28/2015 08/29/2015  Eye Exam No Retinopathy No Retinopathy -  Foot Form Completion - - Done   A1c 6.4 Phyllis Campbell  reports that  has never smoked. she has never used smokeless tobacco. She reports that she does not drink alcohol or use drugs.     Assessment & Plan:    DM  (diabetes mellitus) with complications (HCC) - Plan: CBC with Differential/Platelet, Comprehensive metabolic panel, Lipid panel, POCT UA - Microalbumin, POCT glycosylated hemoglobin (Hb A1C)  Hyperlipidemia associated with type 2 diabetes mellitus (HCC) - Plan: Lipid panel  Hypertension complicating diabetes (HCC) - Plan: CBC with Differential/Platelet, Comprehensive metabolic panel  Obesity (BMI 35.0-39.9 without comorbidity) 1.  2. Rx changes: none 3. Education: Reviewed 'ABCs' of diabetes management (respective goals in parentheses):  A1C (<7), blood pressure (<130/80), and cholesterol (LDL <100). 4. Compliance at present is estimated to be excellent. Efforts to improve compliance (if necessary) will be directed at Continue with present lifestyle. 5. Follow up: 4 months

## 2017-11-13 ENCOUNTER — Encounter: Payer: Self-pay | Admitting: Family Medicine

## 2017-11-13 ENCOUNTER — Telehealth: Payer: Self-pay | Admitting: Family Medicine

## 2017-11-13 ENCOUNTER — Ambulatory Visit (INDEPENDENT_AMBULATORY_CARE_PROVIDER_SITE_OTHER): Payer: Medicare Other | Admitting: Family Medicine

## 2017-11-13 VITALS — BP 132/84 | HR 80 | Temp 103.5°F | Ht 65.0 in | Wt 212.0 lb

## 2017-11-13 DIAGNOSIS — B349 Viral infection, unspecified: Secondary | ICD-10-CM | POA: Diagnosis not present

## 2017-11-13 DIAGNOSIS — R509 Fever, unspecified: Secondary | ICD-10-CM | POA: Diagnosis not present

## 2017-11-13 NOTE — Telephone Encounter (Signed)
Pt called and stated that she just feels terrible. [facility] She states she has been up all night long throwing up, fever and chills. Pt was offered an appt today but stated she would like rather have a note sent to [facility] due to the fact he is not in today. Pt can be reached at [phone] and uses [facility] on [address]

## 2017-11-13 NOTE — Progress Notes (Signed)
Chief Complaint  Patient presents with  . Cough    and chills started Monday night. Had fever as well but not sure of how high.    2 days ago she started with cough, sneezing, fever (didn't check), chills.  She continues to feel bad, feels chilled, can't get warm. She has pain in her shoulders, chest, ribs.  Nasal drainage is clear.  Cough is productive of whitish yellow mucus.  She is having cough when she exerts herself, feels wheezy.  Was vomiting last night (multiple times) and had some loose stool today (even some leakage, now wearing Depends). Keeping fluids down today  She has some discomfort at her collarbone, no pain with breathing  +sick contacts--many 11 mo great-grandson has viral respiratory infection (evaluated today, diagnosed as virus, treated with Motrin)   PMH, PSH SH reviewed. HTN, diet-controlled DM, h/o CVA Seen 2 days ago for med check with Dr. Susann GivensLalonde. (labs reviewed)  Outpatient Encounter Medications as of 11/13/2017  Medication Sig Note  . aspirin EC 81 MG tablet Take 81 mg by mouth daily.   . Chlorpheniramine-DM (CORICIDIN HBP COUGH/COLD PO) Take 2 tablets by mouth as needed.   . Ginkgo Biloba 40 MG TABS Take 60 mg by mouth as needed. Reported on 02/08/2016   . glucose blood test strip Patient test one time a day DX: E11.9 This is for ONE TOUCH VERIO Use as instructed   . Liniments (SALONPAS ARTHRITIS PAIN RELIEF EX) Apply topically.   Marland Kitchen. losartan-hydrochlorothiazide (HYZAAR) 50-12.5 MG tablet TAKE 1 TABLET BY MOUTH  DAILY   . ONETOUCH DELICA LANCETS FINE MISC 1 each by Does not apply route daily.   . Pseudoephedrine-Ibuprofen (ADVIL COLD/SINUS PO) Take 2 tablets by mouth as needed.   . RESTASIS 0.05 % ophthalmic emulsion  01/13/2016: Received from: External Pharmacy  . simvastatin (ZOCOR) 40 MG tablet TAKE 1 TABLET BY MOUTH  DAILY   . ibuprofen (ADVIL,MOTRIN) 200 MG tablet Take 200 mg by mouth every 6 (six) hours as needed. Reported on 02/08/2016   . meclizine  (ANTIVERT) 12.5 MG tablet Take 1 tablet (12.5 mg total) by mouth 3 (three) times daily as needed for dizziness. (Patient not taking: Reported on 11/13/2017)    No facility-administered encounter medications on file as of 11/13/2017.    No Known Allergies  ROS:  +fever, chills, URI symptoms, N/V/D per HPI.  Some myalgias, mainly upper body.  Denies bleeding, bruising, rash, edema, chest pain, urinary complaints.  PHYSICAL EXAM:  BP 132/84   Pulse 80   Temp (!) 103.5 F (39.7 C) (Tympanic)   Ht 5\' 5"  (1.651 m)   Wt 212 lb (96.2 kg)   BMI 35.28 kg/m    Tired appearing female, warm, alert and oriented, in no distress. Only occasional cough HEENT: PERRL, EOMI, conjunctiva and sclera clear. TM's and EACs normal (partially obscured by cerumen, visualized portion normal) Nasal mucosa mildly edematous, clear mucus. OP clear, moist mucus membranes. Mildly tender over left temporalis muscle and left frontal sinus Neck no lymphadenopathy or mass Heart: regular rate and rhythm Lungs: clear bilaterally, no wheezes, rales, ronchi Abdomen:  Normal bowel sounds, nontender, no mass Back: no CVA tenderness Skin: normal turgor no rash Psych: normal mood, affect, hygiene and grooming Neuro: alert and oriented, cranial nerves intact  Influenza A&B negative   ASSESSMENT/PLAN:  Viral syndrome - with respiratory and GI symptoms.  +sick contacts. no evidence of bacterial infection on exam. Supportive measures reviewed  Fever, unspecified fever cause - Plan:  POC Influenza A&B (Binax test)  Reviewed s/sx bacterial infections (pneumonia, sinus, urinary, GI), and to fu if sx persist/worsen. Supportive measures reviewed in detail    Please try and get the fever down by using a combination (if needed) of tylenol and ibuprofen.  Be sure to take ibuprofen with food (not an empty stomach). Realize that cold medications may contain acetaminophen and/or ibuprofen, and to not overlap these.   Continue  coricidin. If that medication does NOT contain guaifenesin, then take a separate mucinex (plain) or other plain guaifenesin medication.  This loosens up phlegm, so will help with chest congestion and thick mucus.  Be sure to keep your private area clean--having some leakage of stool can increase the risk of having a bladder infection.  Be sure to change your pads/underwear frequently, and wipe front to back. Return immediately if you develop pain with urination, odor, blood or other urinary symptoms. If you develop back pain, or fevers recur, also return for recheck.   It is important that you stay well hydrated (due to fever and loose stools). Avoid dairy while having issues with loose bowels (for at least a few day after the stools are back to normal). Eat a bland diet (chicken noodle soup), toast, bananas  I think you have a viral infection--affecting your upper respiratory system as well as your bowels.  I don't hear any pneumonia or see other bacterial infection at this time. If you have worsening sinus pain, discolored nasal drainage, worsening cough, shortness of breath, or other ongoing/worsening symptoms, please get rechecked.

## 2017-11-13 NOTE — Telephone Encounter (Signed)
Treat the fever and chills with Tylenol and have her try Emetrol for the nausea vomiting.  Have her come in tomorrow if she is not any better

## 2017-11-13 NOTE — Patient Instructions (Signed)
  Please try and get the fever down by using a combination (if needed) of tylenol and ibuprofen.  Be sure to take ibuprofen with food (not an empty stomach). Realize that cold medications may contain acetaminophen and/or ibuprofen, and to not overlap these.   Continue coricidin. If that medication does NOT contain guaifenesin, then take a separate mucinex (plain) or other plain guaifenesin medication.  This loosens up phlegm, so will help with chest congestion and thick mucus.  Be sure to keep your private area clean--having some leakage of stool can increase the risk of having a bladder infection.  Be sure to change your pads/underwear frequently, and wipe front to back. Return immediately if you develop pain with urination, odor, blood or other urinary symptoms. If you develop back pain, or fevers recur, also return for recheck.   It is important that you stay well hydrated (due to fever and loose stools). Avoid dairy while having issues with loose bowels (for at least a few day after the stools are back to normal). Eat a bland diet (chicken noodle soup), toast, bananas  I think you have a viral infection--affecting your upper respiratory system as well as your bowels.  I don't hear any pneumonia or see other bacterial infection at this time. If you have worsening sinus pain, discolored nasal drainage, worsening cough, shortness of breath, or other ongoing/worsening symptoms, please get rechecked.

## 2017-11-13 NOTE — Telephone Encounter (Signed)
Patient being seen now by Dr.Knapp.

## 2017-11-14 LAB — POC INFLUENZA A&B (BINAX/QUICKVUE)
INFLUENZA A, POC: NEGATIVE
Influenza B, POC: NEGATIVE

## 2018-03-07 ENCOUNTER — Ambulatory Visit
Admission: RE | Admit: 2018-03-07 | Discharge: 2018-03-07 | Disposition: A | Discharge status: Home / Self Care | Payer: Medicare Other | Source: Ambulatory Visit | Attending: Family Medicine | Admitting: Family Medicine

## 2018-03-07 ENCOUNTER — Ambulatory Visit: Payer: Medicare Other | Admitting: Family Medicine

## 2018-03-07 ENCOUNTER — Encounter: Payer: Self-pay | Admitting: Family Medicine

## 2018-03-07 VITALS — BP 130/82 | HR 75 | Temp 97.8°F | Ht 64.0 in | Wt 203.4 lb

## 2018-03-07 DIAGNOSIS — G8929 Other chronic pain: Secondary | ICD-10-CM

## 2018-03-07 DIAGNOSIS — M545 Low back pain: Principal | ICD-10-CM

## 2018-03-07 NOTE — Progress Notes (Signed)
   Subjective:    Patient ID: Phyllis Campbell, female    DOB: 1932-04-08, 84 y.o.   MRN: [id number]  HPI She is here for evaluation of a several year history of low back pain.  She did have an MRI in 2010 which did show foraminal stenosis bilaterally.  Apparently she did get injections back then and states that they did not help.  The pain in the last 6 months is getting worse and is radiating down her right leg.   Review of Systems     Objective:   Physical Exam Alert and complaining of back pain.  No tenderness palpation of her back.  Fairly good motion of lumbar spine.  DTRs are 1+.  Negative straight leg raising.  Normal hip motion.       Assessment & Plan:  Chronic low back pain, unspecified back pain laterality, with sciatica presence unspecified - Plan: DG Lumbar Spine Complete, Ambulatory referral to Orthopedic Surgery Since she is having more difficulty with her back and having a previous MRI, I will refer to orthopedics.

## 2018-04-02 ENCOUNTER — Other Ambulatory Visit (INDEPENDENT_AMBULATORY_CARE_PROVIDER_SITE_OTHER): Payer: Self-pay

## 2018-04-02 ENCOUNTER — Encounter (INDEPENDENT_AMBULATORY_CARE_PROVIDER_SITE_OTHER): Payer: Self-pay | Admitting: Physician Assistant

## 2018-04-02 ENCOUNTER — Ambulatory Visit (INDEPENDENT_AMBULATORY_CARE_PROVIDER_SITE_OTHER): Payer: Medicare Other | Admitting: Orthopaedic Surgery

## 2018-04-02 ENCOUNTER — Encounter (INDEPENDENT_AMBULATORY_CARE_PROVIDER_SITE_OTHER): Payer: Self-pay | Admitting: Orthopaedic Surgery

## 2018-04-02 DIAGNOSIS — M5441 Lumbago with sciatica, right side: Secondary | ICD-10-CM

## 2018-04-02 DIAGNOSIS — G8929 Other chronic pain: Secondary | ICD-10-CM | POA: Diagnosis not present

## 2018-04-02 DIAGNOSIS — M4807 Spinal stenosis, lumbosacral region: Secondary | ICD-10-CM

## 2018-04-02 MED ORDER — METHOCARBAMOL 500 MG PO TABS: 500.0000 mg | ORAL_TABLET | Freq: Every evening | ORAL | 1 refills | Status: DC | PRN

## 2018-04-02 NOTE — Progress Notes (Signed)
Office Visit Note   Patient: Phyllis Campbell           Date of Birth: Aug 29, 1932           MRN: [id number] Visit Date: 04/02/2018              Requested by: Ronnald NianLalonde, John C, MD [address] [place], [place] [id number] PCP: Ronnald NianLalonde, John C, MD   Assessment & Plan: Visit Diagnoses:  1. Chronic right-sided low back pain with right-sided sciatica     Plan: Due to the fact the patient has had chronic ongoing pain is worse when she is failed conservative treatment in the past including epidural steroid injections and therapy recommend MRI of the lumbar spine to evaluate for HNP as the source of worsening pain.  Have her back after the MRI to go over results and discuss further treatment.  She is given Robaxin 500 mg 1 p.o. nightly.  Also did call in a prescription for Valium for take due to the fact that she is claustrophobic prior to her MRI.  Questions encouraged and answered by Dr. Magnus IvanBlackman and myself.  Follow-Up Instructions: Return for After MRI.   Orders:  No orders of the defined types were placed in this encounter.  No orders of the defined types were placed in this encounter.     Procedures: No procedures performed   Clinical Data: No additional findings.   Subjective: Chief Complaint  Patient presents with  . Lower Back - Pain    HPI Phyllis Campbell is a 84 year old female comes in today for low back pain.  Said low back pain is been going on for a number of years in the back in 2010 had an MRI of her back for some epidural steroid injections and 3 and sets of injections without any relief.  Prior to that she had some physical therapy that actually made her back pain worse.  She is now having pain in her low back that is becoming worse with radicular symptoms down her right leg.  She states she cannot walk for very long or stand for prolonged period of time due to the back pain with radicular symptoms down the right leg starts lateral aspect of the right thigh goes  into the anterior mid tib-fib region.  She does note some bladder incontinence is started over the last 2 months.  She has waking pain due to the back.  She is tried Tylenol arthritis 650 mg 2 tablets daily gives her minimal relief.  She is tried some rubs that really do not work.  She is diabetic last hemoglobin A1c 6.4 back in October 2018.  Reports that her diabetes is under good control currently.  She also has some renal insufficiency has been told not to take NSAIDs. Radiographs of the lumbar spine dated 03/07/2018 personally reviewed show no acute fracture.  No spondylolisthesis.  She does have scoliosis lumbar spine.  Endplate spurring lower lumbar spine.  Disc space loss of height L3-S1.  Facet degenerative changes L3-S1 on the right hand L to through L5 on the left.  Review of Systems Negative for fevers shortness of breath, chest pain or orthopnea. Positive for chills bladder incontinence and bilateral shoulder pain otherwise please see HPI.   Objective: Vital Signs: There were no vitals taken for this visit.  Physical Exam  Constitutional: She is oriented to person, place, and time. She appears well-developed and well-nourished. No distress.  Cardiovascular: Intact distal pulses.  Pulmonary/Chest: Effort normal.  Neurological: She is alert and oriented to person, place, and time.  Skin: She is not diaphoretic.  Psychiatric: She has a normal mood and affect.    Ortho Exam Tight hamstrings bilaterally.  Negative straight leg raise bilaterally.  5 out of 5 strength throughout the lower extremities against resistance bilaterally.  Deep tendon reflexes are 2+ at the knees and 1+ at the ankles and equal and symmetric sensation grossly intact bilateral feet to light touch. Specialty Comments:  No specialty comments available.  Imaging: No results found.   PMFS History: Patient Active Problem List   Diagnosis Date Noted  . Chronic low back pain 12/31/2016  . Atherosclerosis of  native arteries of extremity with intermittent claudication (HCC) 03/13/2012  . Hypertension complicating diabetes (HCC) 07/09/2011  . Allergic rhinitis due to pollen 07/09/2011  . DM (diabetes mellitus) with complications (HCC) 07/09/2011  . Hyperlipidemia associated with type 2 diabetes mellitus (HCC) 07/09/2011  . Arthritis 07/09/2011   Past Medical History:  Diagnosis Date  . Allergy   . Arthritis   . Cataracts, bilateral   . Diabetes mellitus   . DVT (deep venous thrombosis) (HCC)   . Dyslipidemia   . HH (hiatus hernia)   . Hypertension   . Obesity   . Postmenopausal   . Stroke (HCC)   . Thyromegaly     No family history on file.  Past Surgical History:  Procedure Laterality Date  . ABDOMINAL HYSTERECTOMY    . cataracts    . CESAREAN SECTION    . COLONOSCOPY  2004  . EYE SURGERY    . SHOULDER SURGERY     Social History   Occupational History  . Not on file  Tobacco Use  . Smoking status: Never Smoker  . Smokeless tobacco: Never Used  Substance and Sexual Activity  . Alcohol use: No  . Drug use: No  . Sexual activity: Not Currently

## 2018-04-08 ENCOUNTER — Ambulatory Visit: Payer: Medicare Other | Admitting: Family Medicine

## 2018-04-08 ENCOUNTER — Encounter: Payer: Self-pay | Admitting: Family Medicine

## 2018-04-08 VITALS — BP 136/80 | HR 84 | Temp 97.8°F | Wt 203.4 lb

## 2018-04-08 DIAGNOSIS — M545 Low back pain: Secondary | ICD-10-CM

## 2018-04-08 DIAGNOSIS — G8929 Other chronic pain: Secondary | ICD-10-CM | POA: Diagnosis not present

## 2018-04-08 DIAGNOSIS — E119 Type 2 diabetes mellitus without complications: Secondary | ICD-10-CM

## 2018-04-08 DIAGNOSIS — I10 Essential (primary) hypertension: Secondary | ICD-10-CM | POA: Diagnosis not present

## 2018-04-08 DIAGNOSIS — E1159 Type 2 diabetes mellitus with other circulatory complications: Secondary | ICD-10-CM | POA: Diagnosis not present

## 2018-04-08 DIAGNOSIS — E118 Type 2 diabetes mellitus with unspecified complications: Secondary | ICD-10-CM

## 2018-04-08 DIAGNOSIS — E785 Hyperlipidemia, unspecified: Secondary | ICD-10-CM | POA: Diagnosis not present

## 2018-04-08 DIAGNOSIS — E1169 Type 2 diabetes mellitus with other specified complication: Secondary | ICD-10-CM

## 2018-04-08 DIAGNOSIS — E669 Obesity, unspecified: Secondary | ICD-10-CM | POA: Diagnosis not present

## 2018-04-08 LAB — POCT GLYCOSYLATED HEMOGLOBIN (HGB A1C): HEMOGLOBIN A1C: 6.2 % — AB (ref 4.0–5.6)

## 2018-04-08 NOTE — Progress Notes (Signed)
  Subjective:    Patient ID: Phyllis Campbell, female    DOB: [birth year], 84 y.o.   MRN: [id number]  Phyllis Campbell is a 84 y.o. female who presents for follow-up of Type 2 diabetes mellitus. Patient is checking home blood sugars.   Home blood sugar records:meter record How often is blood sugars being checked: q Month Current symptoms/problems include none and have been unchanged. Daily foot checks: yes   Any foot concerns: no Last eye exam: 2018 fall Exercise: walking for 30 min water exercise She was recently seen by Dr. Magnus IvanBlackman and is now on a muscle relaxer which is helping her back. She continues on simvastatin and is having no aches or pains with that.  She also taking losartan and HCTZ without difficulty.  She keeps herself busy but is not in a regular exercise program. The following portions of the patient's history were reviewed and updated as appropriate: allergies, current medications, past medical history, past social history and problem list.  ROS as in subjective above.     Objective:    Physical Exam Alert and in no distress otherwise not examined.   Lab Review Diabetic Labs Latest Ref Rng & Units 04/08/2018 11/11/2017 07/10/2017 02/19/2017 10/23/2016  HbA1c 4.0 - 5.6 % 6.2(A) 6.4 6.4 6.6 6.7  Microalbumin mg/L - 9.5 - - 18.3  Micro/Creat Ratio - - 16.1 - - 12.7  Chol 100 - 199 mg/dL - 409132 - - 811124  HDL >91>39 mg/dL - 56 - - 63  Calc LDL 0 - 99 mg/dL - 66 - - 48  Triglycerides 0 - 149 mg/dL - 51 - - 63  Creatinine 0.57 - 1.00 mg/dL - 4.780.79 - - 2.950.79   BP/Weight 04/08/2018 03/07/2018 11/13/2017 11/11/2017 07/19/2017  Systolic BP 136 130 132 136 130  Diastolic BP 80 82 84 82 68  Wt. (Lbs) 203.4 203.4 212 209.8 213  BMI 34.91 34.91 35.28 34.91 35.45   Foot/eye exam completion dates Latest Ref Rng & Units 11/11/2017 11/28/2015  Eye Exam No Retinopathy - No Retinopathy  Foot Form Completion - Done -  A1c is 6.2  Phyllis Campbell  reports that she has never smoked. She has never used smokeless  tobacco. She reports that she does not drink alcohol or use drugs.     Assessment & Plan:    DM (diabetes mellitus) with complications (HCC) - Plan: POCT glycosylated hemoglobin (Hb A1C)  Chronic low back pain, unspecified back pain laterality, with sciatica presence unspecified  Hyperlipidemia associated with type 2 diabetes mellitus (HCC)  Hypertension complicating diabetes (HCC)  Obesity (BMI 35.0-39.9 without comorbidity)    1. Rx changes: none 2. Education: Reviewed 'ABCs' of diabetes management (respective goals in parentheses):  A1C (<7), blood pressure (<130/80), and cholesterol (LDL <100). 3. Compliance at present is estimated to be good. Efforts to improve compliance (if necessary) will be directed at increased exercise. 4. Follow up: 4 months

## 2018-04-21 ENCOUNTER — Other Ambulatory Visit: Payer: Self-pay | Admitting: Family Medicine

## 2018-04-22 ENCOUNTER — Other Ambulatory Visit: Payer: Self-pay | Admitting: Family Medicine

## 2018-04-25 ENCOUNTER — Ambulatory Visit
Admission: RE | Admit: 2018-04-25 | Discharge: 2018-04-25 | Disposition: A | Discharge status: Home / Self Care | Payer: Medicare Other | Source: Ambulatory Visit | Attending: Orthopaedic Surgery | Admitting: Orthopaedic Surgery

## 2018-04-25 DIAGNOSIS — M4807 Spinal stenosis, lumbosacral region: Secondary | ICD-10-CM

## 2018-04-28 ENCOUNTER — Other Ambulatory Visit: Payer: Self-pay

## 2018-04-28 MED ORDER — GLUCOSE BLOOD VI STRP: ORAL_STRIP | 3 refills | Status: DC

## 2018-04-30 ENCOUNTER — Other Ambulatory Visit (INDEPENDENT_AMBULATORY_CARE_PROVIDER_SITE_OTHER): Payer: Self-pay

## 2018-04-30 ENCOUNTER — Encounter (INDEPENDENT_AMBULATORY_CARE_PROVIDER_SITE_OTHER): Payer: Self-pay | Admitting: Orthopaedic Surgery

## 2018-04-30 ENCOUNTER — Ambulatory Visit (INDEPENDENT_AMBULATORY_CARE_PROVIDER_SITE_OTHER): Payer: Medicare Other | Admitting: Orthopaedic Surgery

## 2018-04-30 DIAGNOSIS — G8929 Other chronic pain: Secondary | ICD-10-CM

## 2018-04-30 DIAGNOSIS — M5441 Lumbago with sciatica, right side: Secondary | ICD-10-CM

## 2018-04-30 DIAGNOSIS — M545 Low back pain: Principal | ICD-10-CM

## 2018-04-30 MED ORDER — METHOCARBAMOL 500 MG PO TABS: 500.0000 mg | ORAL_TABLET | Freq: Every evening | ORAL | 1 refills | Status: DC | PRN

## 2018-04-30 NOTE — Progress Notes (Signed)
Office Visit Note   Patient: Phyllis Campbell           Date of Birth: 01/08/1932           MRN: [id number] Visit Date: 04/30/2018              Requested by: Ronnald Nian, MD [address] [place], [place] [address] PCP: Ronnald Nian, MD   Assessment & Plan: Visit Diagnoses:  1. Chronic right-sided low back pain with right-sided sciatica     Plan: Due to the findings on the MRI scan and the fact that the patient is failed conservative treatment and is having daily pain that is severely affecting her life will refer her to neurosurgery for evaluation and treatment.  Did refill her Robaxin which she is taking 500 mg 1 p.o. nightly.  She will follow-up with [place] on an as-needed basis.  Follow-Up Instructions: Return if symptoms worsen or fail to improve.   Orders:  No orders of the defined types were placed in this encounter.  Meds ordered this encounter  Medications  . methocarbamol (ROBAXIN) 500 MG tablet    Sig: Take 1 tablet (500 mg total) by mouth at bedtime as needed for muscle spasms.    Dispense:  30 tablet    Refill:  1      Procedures: No procedures performed   Clinical Data: No additional findings.   Subjective: Chief Complaint  Patient presents with  . Lower Back - Follow-up    HPI Phyllis Campbell returns today for follow-up of her low back pain with radicular symptoms down the right leg.  She is undergone an MRI since she was last seen.  She continues to have low back pain with radicular symptoms down the right leg are worse throughout the day.  She does have some pain on the left side and occasional radicular symptoms down left leg but the right is definitely worse.  She states her pain is worse the longer she is up standing but has found that if she wears a pair of shoes with a slight heel he does help some.  The Robaxin is given for her to take at night has helped some.  She continues to have waking pain due to her back.  Again she has had epidural  steroid injections with no relief of her low back pain or radicular symptoms down the right leg. MRI is reviewed with patient including images reviewed with the patient today.  Lumbar MRIs dated 04/25/2018 and showed worsening spondylosis at L4-5 with severe narrowing in the right subarticular recess impinging upon the right L5 nerve root.  Foraminal stenosis at this level on the right was moderately severe.  Spondylosis at L5-S1 had became worse since the last exam when with impingement upon the S1 right nerve root.  Mild to moderate bilateral foraminal narrowing at L5-S1.  L3-4 moderate advanced facet degenerative disease bilaterally worse on the right.  Moderate to moderately severe central canal stenosis and narrowing in the right subarticular recess at L3-4. L1-2 mild to moderate facet degenerative disease worse on the left.  L2-3 moderate facet arthropathy worse on the left with a disc bulge eccentric to the left resulting in moderate central canal stenosis.  Review of Systems Please see HPI  Objective: Vital Signs: There were no vitals taken for this visit.  Physical Exam  Constitutional: She is oriented to person, place, and time. She appears well-developed and well-nourished. No distress.  Pulmonary/Chest: Effort normal.  Neurological:  She is alert and oriented to person, place, and time.  Skin: She is not diaphoretic.  Psychiatric: She has a normal mood and affect.    Ortho Exam Positive straight leg raise on the right.  Lower extremity strength testing 5 out of 5 strength throughout against resistance. Specialty Comments:  No specialty comments available.  Imaging: No results found.   PMFS History: Patient Active Problem List   Diagnosis Date Noted  . Chronic low back pain 12/31/2016  . Atherosclerosis of native arteries of extremity with intermittent claudication (HCC) 03/13/2012  . Hypertension complicating diabetes (HCC) 07/09/2011  . Allergic rhinitis due to pollen  07/09/2011  . DM (diabetes mellitus) with complications (HCC) 07/09/2011  . Hyperlipidemia associated with type 2 diabetes mellitus (HCC) 07/09/2011  . Arthritis 07/09/2011   Past Medical History:  Diagnosis Date  . Allergy   . Arthritis   . Cataracts, bilateral   . Diabetes mellitus   . DVT (deep venous thrombosis) (HCC)   . Dyslipidemia   . HH (hiatus hernia)   . Hypertension   . Obesity   . Postmenopausal   . Stroke (HCC)   . Thyromegaly     No family history on file.  Past Surgical History:  Procedure Laterality Date  . ABDOMINAL HYSTERECTOMY    . cataracts    . CESAREAN SECTION    . COLONOSCOPY  2004  . EYE SURGERY    . SHOULDER SURGERY     Social History   Occupational History  . Not on file  Tobacco Use  . Smoking status: Never Smoker  . Smokeless tobacco: Never Used  Substance and Sexual Activity  . Alcohol use: No  . Drug use: No  . Sexual activity: Not Currently

## 2018-06-03 DIAGNOSIS — M5416 Radiculopathy, lumbar region: Secondary | ICD-10-CM | POA: Insufficient documentation

## 2018-06-17 ENCOUNTER — Other Ambulatory Visit: Payer: Self-pay | Admitting: Family Medicine

## 2018-07-07 ENCOUNTER — Telehealth: Payer: Self-pay | Admitting: Family Medicine

## 2018-07-07 NOTE — Telephone Encounter (Signed)
Pt wants Dr. Susann Givens to know that the specialist wants to give her a shot in her back because they said she has scoliosis. Does Dr. Susann Givens think this will be ok? Call pt at [phone].

## 2018-07-07 NOTE — Telephone Encounter (Signed)
Pt was aware . KH

## 2018-07-07 NOTE — Telephone Encounter (Signed)
Let her know that I think it is definitely worth trying

## 2018-07-14 ENCOUNTER — Other Ambulatory Visit: Payer: Self-pay

## 2018-07-14 DIAGNOSIS — E1169 Type 2 diabetes mellitus with other specified complication: Secondary | ICD-10-CM

## 2018-07-14 DIAGNOSIS — E785 Hyperlipidemia, unspecified: Secondary | ICD-10-CM

## 2018-07-14 DIAGNOSIS — I10 Essential (primary) hypertension: Principal | ICD-10-CM

## 2018-07-14 DIAGNOSIS — E1159 Type 2 diabetes mellitus with other circulatory complications: Secondary | ICD-10-CM

## 2018-07-14 MED ORDER — SIMVASTATIN 40 MG PO TABS: 40.0000 mg | ORAL_TABLET | Freq: Every day | ORAL | 3 refills | Status: DC

## 2018-07-14 MED ORDER — LOSARTAN POTASSIUM-HCTZ 50-12.5 MG PO TABS: 1.0000 | ORAL_TABLET | Freq: Every day | ORAL | 3 refills | Status: DC

## 2018-07-14 NOTE — Telephone Encounter (Signed)
Pt called and requested the pending medications be sent to [facility] mail for a 90 day supply.

## 2018-07-19 ENCOUNTER — Telehealth: Payer: Self-pay | Admitting: Family Medicine

## 2018-07-19 NOTE — Telephone Encounter (Signed)
P.A. MECLIZINE  °

## 2018-07-23 NOTE — Telephone Encounter (Signed)
P.A. Denied, Medicare will not pay for this medication for this diagnosis.  Pt paid out of pocket $14.77 and picked up already

## 2018-08-05 ENCOUNTER — Other Ambulatory Visit: Payer: Self-pay

## 2018-08-05 DIAGNOSIS — E1159 Type 2 diabetes mellitus with other circulatory complications: Secondary | ICD-10-CM

## 2018-08-05 DIAGNOSIS — I10 Essential (primary) hypertension: Principal | ICD-10-CM

## 2018-08-05 MED ORDER — LOSARTAN POTASSIUM-HCTZ 50-12.5 MG PO TABS: 1.0000 | ORAL_TABLET | Freq: Every day | ORAL | 3 refills | Status: DC

## 2018-08-07 ENCOUNTER — Ambulatory Visit
Admission: RE | Admit: 2018-08-07 | Discharge: 2018-08-07 | Disposition: A | Discharge status: Home / Self Care | Payer: Medicare Other | Source: Ambulatory Visit | Attending: Family Medicine | Admitting: Family Medicine

## 2018-08-07 ENCOUNTER — Ambulatory Visit: Payer: Medicare Other | Admitting: Family Medicine

## 2018-08-07 ENCOUNTER — Encounter: Payer: Self-pay | Admitting: Family Medicine

## 2018-08-07 VITALS — BP 128/82 | HR 86 | Temp 97.9°F | Wt 201.6 lb

## 2018-08-07 DIAGNOSIS — M25562 Pain in left knee: Secondary | ICD-10-CM

## 2018-08-07 DIAGNOSIS — Z23 Encounter for immunization: Secondary | ICD-10-CM | POA: Diagnosis not present

## 2018-08-07 NOTE — Patient Instructions (Signed)
You can take 2 Tylenol 4 times per day for your pain and if you need to you can also take 2 Aleve twice per day

## 2018-08-07 NOTE — Progress Notes (Signed)
   Subjective:    Patient ID: Phyllis Campbell, female    DOB: Nov 08, 1931, 84 y.o.   MRN: [id number]  HPI She states that on Tuesday while walking, her left knee gave way with pain following that.  Now she has pain with any physical activity.  No popping locking or grinding.  No other joints are involved.  She has not had any previous history of difficulty with her knee.   Review of Systems     Objective:   Physical Exam Alert and in no distress.  Left knee exam shows minimal effusion.  No patellar tenderness.  Anterior drawer negative.  Ileal lateral collateral ligaments intact.  Negative McMurray's testing.  Good motion of the knee.       Assessment & Plan:  Acute pain of left knee - Plan: DG Knee Complete 4 Views Left  Need for influenza vaccination - Plan: Flu vaccine HIGH DOSE PF (Fluzone High dose) Recommend Tylenol and then adding Aleve 2 pills twice per day if needed.  Discussed the possibility of this being arthritis and future intervention including injections and possible referral.  She voiced understanding.

## 2018-08-11 ENCOUNTER — Ambulatory Visit: Payer: Medicare Other | Admitting: Family Medicine

## 2018-08-12 ENCOUNTER — Other Ambulatory Visit: Payer: Self-pay

## 2018-08-12 DIAGNOSIS — I70213 Atherosclerosis of native arteries of extremities with intermittent claudication, bilateral legs: Secondary | ICD-10-CM

## 2018-08-25 ENCOUNTER — Other Ambulatory Visit: Payer: Self-pay

## 2018-08-25 ENCOUNTER — Ambulatory Visit (HOSPITAL_COMMUNITY)
Admission: RE | Admit: 2018-08-25 | Discharge: 2018-08-25 | Disposition: A | Discharge status: Home / Self Care | Payer: Medicare Other | Source: Ambulatory Visit | Attending: Family | Admitting: Family

## 2018-08-25 ENCOUNTER — Encounter: Payer: Self-pay | Admitting: Family

## 2018-08-25 ENCOUNTER — Ambulatory Visit (INDEPENDENT_AMBULATORY_CARE_PROVIDER_SITE_OTHER): Payer: Medicare Other | Admitting: Family

## 2018-08-25 VITALS — BP 125/67 | HR 64 | Temp 97.1°F | Resp 16 | Ht 64.0 in | Wt 202.0 lb

## 2018-08-25 DIAGNOSIS — E1151 Type 2 diabetes mellitus with diabetic peripheral angiopathy without gangrene: Secondary | ICD-10-CM

## 2018-08-25 DIAGNOSIS — R200 Anesthesia of skin: Secondary | ICD-10-CM

## 2018-08-25 DIAGNOSIS — I70213 Atherosclerosis of native arteries of extremities with intermittent claudication, bilateral legs: Secondary | ICD-10-CM

## 2018-08-25 DIAGNOSIS — M48062 Spinal stenosis, lumbar region with neurogenic claudication: Secondary | ICD-10-CM | POA: Diagnosis not present

## 2018-08-25 NOTE — Progress Notes (Signed)
[facility] [place]   CC: Follow up peripheral artery occlusive disease  History of Present Illness Phyllis Campbell is a 84 y.o. female  with DM2 who presents with chief complaint: bilateral leg numbness.  Onset of symptom occurred in 2017, without obvious trigger.  Patient denies classic intermittent claudication sx.  Pt notes her numbness is intermittent without obvious trigger pattern.  The patient has no rest pain symptoms also and no leg wounds/ulcers.   Atherosclerotic risk factors include: DM, HTN, HLD.  Pt also notes some back pain.  Dr. Imogene Burn last evaluated pt on 07-28-17. At that time ABI were consistent with minimal R leg PAD and mild to moderate L leg PAD Based on this patient's history and physical exam, Dr. Imogene Burn recommended: maximal medical mgmt and annual ABI.  She has arthritis and scoliosis, receives injections in her low back to help with pain in her low back and legs.  She has numbness in her hands and feet equally.   She has been to a [employer] to exercise in the past.    Diabetic: Yes, 6.2 A1C on 04-08-18, pt stated her blood sugar drops at times Tobacco use: non-smoker  Pt meds include: Statin :Yes Betablocker: No ASA: Yes Other anticoagulants/antiplatelets: no  Past Medical History:  Diagnosis Date  . Allergy   . Arthritis   . Cataracts, bilateral   . Diabetes mellitus   . DVT (deep venous thrombosis) (HCC)   . Dyslipidemia   . HH (hiatus hernia)   . Hypertension   . Obesity   . Postmenopausal   . Stroke (HCC)   . Thyromegaly     Social History Social History   Tobacco Use  . Smoking status: Never Smoker  . Smokeless tobacco: Never Used  Substance Use Topics  . Alcohol use: No  . Drug use: No    Family History History reviewed. No pertinent family history.  Past Surgical History:  Procedure Laterality Date  . ABDOMINAL HYSTERECTOMY    . cataracts    . CESAREAN SECTION    . COLONOSCOPY  2004  . EYE SURGERY    .  SHOULDER SURGERY      No Known Allergies  Current Outpatient Medications  Medication Sig Dispense Refill  . aspirin EC 81 MG tablet Take 81 mg by mouth daily.    . Chlorpheniramine-DM (CORICIDIN HBP COUGH/COLD PO) Take 2 tablets by mouth as needed.    . Ginkgo Biloba 40 MG TABS Take 60 mg by mouth as needed. Reported on 02/08/2016    . glucose blood (ONETOUCH VERIO) test strip USE ONE STRIP TO CHECK GLUCOSE ONCE DAILY AND AS NEEDED 100 each 3  . ibuprofen (ADVIL,MOTRIN) 200 MG tablet Take 200 mg by mouth every 6 (six) hours as needed. Reported on 02/08/2016    . Liniments (SALONPAS ARTHRITIS PAIN RELIEF EX) Apply topically.    Marland Kitchen losartan-hydrochlorothiazide (HYZAAR) 50-12.5 MG tablet Take 1 tablet by mouth daily. 90 tablet 3  . meclizine (ANTIVERT) 12.5 MG tablet TAKE 1 TABLET BY MOUTH THREE TIMES DAILY AS NEEDED FOR  DIZZINESS 30 tablet 0  . methocarbamol (ROBAXIN) 500 MG tablet Take 1 tablet (500 mg total) by mouth at bedtime as needed for muscle spasms. 30 tablet 1  . ONETOUCH DELICA LANCETS FINE MISC 1 each by Does not apply route daily. 100 each 6  . Pseudoephedrine-Ibuprofen (ADVIL COLD/SINUS PO) Take 2 tablets by mouth as needed.    . RESTASIS 0.05 % ophthalmic emulsion     .  simvastatin (ZOCOR) 40 MG tablet Take 1 tablet (40 mg total) by mouth daily. 90 tablet 3   No current facility-administered medications for this visit.     ROS: See HPI for pertinent positives and negatives.   Physical Examination  Vitals:   08/25/18 0842  BP: 125/67  Pulse: 64  Resp: 16  Temp: (!) 97.1 F (36.2 C)  TempSrc: Oral  SpO2: 100%  Weight: 202 lb (91.6 kg)  Height: 5\' 4"  (1.626 m)   Body mass index is 34.67 kg/m.  General: A&O x 3, WDWN, obese female. Gait: normal HENT: No gross abnormalities.  Eyes: PERRLA. Pulmonary: Respirations are non labored, CTAB, good air movement in all fields Cardiac: regular rhythm, no detected murmur.         Carotid Bruits Right Left   Negative  Negative   Radial pulses are 2+ palpable bilaterally   Adominal aortic pulse is not palpable                         VASCULAR EXAM: Extremities without ischemic changes, without Gangrene; without open wounds.                                                                                                          LE Pulses Right Left       FEMORAL  2+ palpable  2+ palpable        POPLITEAL  not palpable   not palpable       POSTERIOR TIBIAL  not palpable   not palpable        DORSALIS PEDIS      ANTERIOR TIBIAL not palpable  1+ palpable    Abdomen: soft, NT, no palpable masses. Skin: no rashes, no cellulitis, no ulcers noted. Musculoskeletal: no muscle wasting or atrophy. Bunion right foot.   Neurologic: A&O X 3; appropriate affect, Sensation is normal; MOTOR FUNCTION:  moving all extremities equally, motor strength 5/5 throughout. Speech is fluent/normal. CN 2-12 intact. Psychiatric: Thought content is normal, mood appropriate for clinical situation.     ASSESSMENT: Phyllis Campbell is a 84 y.o. female who has numbness in both hands and feet, with normal ankle brachial indices, abnormal toe brachial indices.   Bilateral radial pulses are 2+ palpable.   The numbness in her hands and feet may be from her long term DM with associated neuropathy and/or arthritis in her spine. She has known lumbar spine problems, no diagnosis of c-spine issues.   She also has some components of peripheral artery occlusive disease as her TBI's are abnormal and bilateral ATA and left PT have biphasic waveforms; right PT has normal triphasic waveforms.   DATA  ABI (Date: 08/25/2018):  R:   ABI: 1.19 (was 0.97 on 07-12-17),   PT: tri  DP: bi  TBI:  0.56, toe pressure 81, (was 0.53)  L:   ABI: 1.04 (was 0.82),   PT: bi  DP: bi  TBI: 0.63, toe pressure 90, (was 0.60) Right ABI remains normal with tri and biphasic waveforms.  Left ABI has improved from mild disease to normal, biphasic  waveforms.  Bilateral TBI remain stable and below normal, however, have adequate toe pressures.     PLAN:  Based on the patient's vascular studies and examination, pt will return to clinic in 18 months with ABI's.  Referral to [facility] for evaluation of occasional painful area on left foot, and DM feet exams.  Pt reports that her sister had a mass on her foot that was cancer.   I discussed in depth with the patient the nature of atherosclerosis, and emphasized the importance of maximal medical management including strict control of blood pressure, blood glucose, and lipid levels, obtaining regular exercise, and continued cessation of smoking.  The patient is aware that without maximal medical management the underlying atherosclerotic disease process will progress, limiting the benefit of any interventions.  The patient was given information about PAD including signs, symptoms, treatment, what symptoms should prompt the patient to seek immediate medical care, and risk reduction measures to take.  Charisse MarchSuzanne Olando Willems, RN, MSN, FNP-C [facility] [place] Office Phone: [phone]  Clinic MD: Myra GianottiBrabham  08/25/18 8:49 AM

## 2018-08-25 NOTE — Patient Instructions (Signed)

## 2018-09-01 ENCOUNTER — Encounter: Payer: Self-pay | Admitting: Family Medicine

## 2018-09-01 ENCOUNTER — Ambulatory Visit: Payer: Medicare Other | Admitting: Family Medicine

## 2018-09-01 VITALS — BP 126/72 | HR 77 | Temp 97.6°F | Ht 64.0 in | Wt 202.4 lb

## 2018-09-01 DIAGNOSIS — E1169 Type 2 diabetes mellitus with other specified complication: Secondary | ICD-10-CM | POA: Diagnosis not present

## 2018-09-01 DIAGNOSIS — E669 Obesity, unspecified: Secondary | ICD-10-CM | POA: Diagnosis not present

## 2018-09-01 DIAGNOSIS — J301 Allergic rhinitis due to pollen: Secondary | ICD-10-CM

## 2018-09-01 DIAGNOSIS — E118 Type 2 diabetes mellitus with unspecified complications: Secondary | ICD-10-CM

## 2018-09-01 DIAGNOSIS — E1159 Type 2 diabetes mellitus with other circulatory complications: Secondary | ICD-10-CM

## 2018-09-01 DIAGNOSIS — E2839 Other primary ovarian failure: Secondary | ICD-10-CM

## 2018-09-01 DIAGNOSIS — E785 Hyperlipidemia, unspecified: Secondary | ICD-10-CM

## 2018-09-01 DIAGNOSIS — M545 Low back pain, unspecified: Secondary | ICD-10-CM

## 2018-09-01 DIAGNOSIS — I70213 Atherosclerosis of native arteries of extremities with intermittent claudication, bilateral legs: Secondary | ICD-10-CM

## 2018-09-01 DIAGNOSIS — G8929 Other chronic pain: Secondary | ICD-10-CM

## 2018-09-01 DIAGNOSIS — I10 Essential (primary) hypertension: Secondary | ICD-10-CM

## 2018-09-01 LAB — POCT GLYCOSYLATED HEMOGLOBIN (HGB A1C): HEMOGLOBIN A1C: 6.4 % — AB (ref 4.0–5.6)

## 2018-09-01 NOTE — Progress Notes (Signed)
  Subjective:    Patient ID: Phyllis Campbell, female    DOB: 02/08/1932, 84 y.o.   MRN: [id number]  Phyllis Campbell is a 84 y.o. female who presents for follow-up of Type 2 diabetes mellitus.  She continues on losartan/HCTZ without difficulty and is also taking simvastatin and having no aches or pains. She has had a recent back injection and is doing quite nicely with that.  She will be scheduled for an eye exam and early January.  Her exercise is quite minimal.  Her allergies are under good control. Patient is checking home blood sugars.   Home blood sugar records: meter records How often is blood sugars being checked:QD fasting and 2 hours post meal 97-167 AVG. Current symptoms/problems include none and have been unchanged. Daily foot checks: yes  Any foot concerns: none Last eye exam: 1/ 2019 Exercise: walking   The following portions of the patient's history were reviewed and updated as appropriate: allergies, current medications, past medical history, past social history and problem list.  ROS as in subjective above.     Objective:    Physical Exam Alert and in no distress otherwise not examined.  Blood pressure 126/72, pulse 77, temperature 97.6 F (36.4 C), height 5\' 4"  (1.626 m), weight 202 lb 6.4 oz (91.8 kg), SpO2 99 %.  Lab Review Diabetic Labs Latest Ref Rng & Units 04/08/2018 11/11/2017 07/10/2017 02/19/2017 10/23/2016  HbA1c 4.0 - 5.6 % 6.2(A) 6.4 6.4 6.6 6.7  Microalbumin mg/L - 9.5 - - 18.3  Micro/Creat Ratio - - 16.1 - - 12.7  Chol 100 - 199 mg/dL - 409132 - - 811124  HDL >91>39 mg/dL - 56 - - 63  Calc LDL 0 - 99 mg/dL - 66 - - 48  Triglycerides 0 - 149 mg/dL - 51 - - 63  Creatinine 0.57 - 1.00 mg/dL - 4.780.79 - - 2.950.79   BP/Weight 09/01/2018 08/25/2018 08/07/2018 04/08/2018 03/07/2018  Systolic BP 126 125 128 136 130  Diastolic BP 72 67 82 80 82  Wt. (Lbs) 202.4 202 201.6 203.4 203.4  BMI 34.74 34.67 34.6 34.91 34.91   Foot/eye exam completion dates Latest Ref Rng & Units  11/11/2017 11/28/2015  Eye Exam No Retinopathy - No Retinopathy  Foot Form Completion - Done -  A1c is 6.4 Phyllis Campbell  reports that she has never smoked. She has never used smokeless tobacco. She reports that she does not drink alcohol or use drugs.     Assessment & Plan:    DM (diabetes mellitus) with complications (HCC)  Hyperlipidemia associated with type 2 diabetes mellitus (HCC)  Hypertension complicating diabetes (HCC)  Obesity (BMI 35.0-39.9 without comorbidity)  Non-seasonal allergic rhinitis due to pollen  Atherosclerosis of native artery of both lower extremities with intermittent claudication (HCC)  Chronic low back pain, unspecified back pain laterality, unspecified whether sciatica present   1. Rx changes: none 2. Education: Reviewed 'ABCs' of diabetes management (respective goals in parentheses):  A1C (<7), blood pressure (<130/80), and cholesterol (LDL <100). 3. Compliance at present is estimated to be good. Efforts to improve compliance (if necessary) will be directed at increased exercise. 4. Follow up: 4 months She will be set up for a DEXA scan as there is no indication that she has had this done in the past.

## 2018-09-01 NOTE — Addendum Note (Signed)
Addended by: Renelda LomaHENRY, Chanelle Hodsdon on: 09/01/2018 04:03 PM   Modules accepted: Orders

## 2018-09-01 NOTE — Progress Notes (Deleted)
Subjective:    Patient ID: Phyllis Campbell, female    DOB: October 05, 1932, 84 y.o.   MRN: [id number]  Phyllis Campbell is a 84 y.o. female who presents for follow-up of Type 2 diabetes mellitus.  Patient {is/are not:32546} checking home blood sugars.   Home blood sugar records: {dm home sugars:14018} How often is blood sugars being checked: *** Current symptoms/problems include {Symptoms; diabetes:14075} and have been {Desc; course:15616}. Daily foot checks: ***   Any foot concerns: *** Last eye exam: *** Exercise: {types:19826}  The following portions of the patient's history were reviewed and updated as appropriate: allergies, current medications, past medical history, past social history and problem list.  ROS as in subjective above.     Objective:    Physical Exam Alert and in no distress otherwise not examined.  There were no vitals taken for this visit.  Lab Review Diabetic Labs Latest Ref Rng & Units 04/08/2018 11/11/2017 07/10/2017 02/19/2017 10/23/2016  HbA1c 4.0 - 5.6 % 6.2(A) 6.4 6.4 6.6 6.7  Microalbumin mg/L - 9.5 - - 18.3  Micro/Creat Ratio - - 16.1 - - 12.7  Chol 100 - 199 mg/dL - 409 - - 811  HDL >91 mg/dL - 56 - - 63  Calc LDL 0 - 99 mg/dL - 66 - - 48  Triglycerides 0 - 149 mg/dL - 51 - - 63  Creatinine 0.57 - 1.00 mg/dL - 4.78 - - 2.95   BP/Weight 08/25/2018 08/07/2018 04/08/2018 03/07/2018 11/13/2017  Systolic BP 125 128 136 130 132  Diastolic BP 67 82 80 82 84  Wt. (Lbs) 202 201.6 203.4 203.4 212  BMI 34.67 34.6 34.91 34.91 35.28   Foot/eye exam completion dates Latest Ref Rng & Units 11/11/2017 11/28/2015  Eye Exam No Retinopathy - No Retinopathy  Foot Form Completion - Done -    Phyllis Campbell  reports that she has never smoked. She has never used smokeless tobacco. She reports that she does not drink alcohol or use drugs.     Assessment & Plan:    No diagnosis found.  1. Rx changes: {none:33079} 2. Education: Reviewed 'ABCs' of diabetes management (respective goals  in parentheses):  A1C (<7), blood pressure (<130/80), and cholesterol (LDL <100). 3. Compliance at present is estimated to be {good/fair/poor:33178}. Efforts to improve compliance (if necessary) will be directed at {compliance:16716}. 4. Follow up: {NUMBERS; 6-21:30865} {time:11}         Subjective:    Patient ID: Phyllis Campbell, female    DOB: 1932-01-23, 84 y.o.   MRN: [id number]  Phyllis Campbell is a 84 y.o. female who presents for follow-up of Type 2 diabetes mellitus.  Patient is checking home blood sugars.   Home blood sugar records: meter record How often is blood sugars being checked: qd fasting ,  hours post meal 97-167 Current symptoms/problems include {Symptoms; diabetes:14075} and have been {Desc; course:15616}. Daily foot checks: yes   Any foot concerns: none Last eye exam: 10/2017 Exercise: walking  The following portions of the patient's history were reviewed and updated as appropriate: allergies, current medications, past medical history, past social history and problem list.  ROS as in subjective above.     Objective:    Physical Exam Alert and in no distress otherwise not examined.  There were no vitals taken for this visit.  Lab Review Diabetic Labs Latest Ref Rng & Units 04/08/2018 11/11/2017 07/10/2017 02/19/2017 10/23/2016  HbA1c 4.0 - 5.6 % 6.2(A) 6.4 6.4 6.6 6.7  Microalbumin mg/L - 9.5 - -  18.3  Micro/Creat Ratio - - 16.1 - - 12.7  Chol 100 - 199 mg/dL - 409132 - - 811124  HDL >91>39 mg/dL - 56 - - 63  Calc LDL 0 - 99 mg/dL - 66 - - 48  Triglycerides 0 - 149 mg/dL - 51 - - 63  Creatinine 0.57 - 1.00 mg/dL - 4.780.79 - - 2.950.79   BP/Weight 08/25/2018 08/07/2018 04/08/2018 03/07/2018 11/13/2017  Systolic BP 125 128 136 130 132  Diastolic BP 67 82 80 82 84  Wt. (Lbs) 202 201.6 203.4 203.4 212  BMI 34.67 34.6 34.91 34.91 35.28   Foot/eye exam completion dates Latest Ref Rng & Units 11/11/2017 11/28/2015  Eye Exam No Retinopathy - No Retinopathy  Foot Form Completion - Done  -    Phyllis Campbell  reports that she has never smoked. She has never used smokeless tobacco. She reports that she does not drink alcohol or use drugs.     Assessment & Plan:    No diagnosis found.  5. Rx changes: {none:33079} 6. Education: Reviewed 'ABCs' of diabetes management (respective goals in parentheses):  A1C (<7), blood pressure (<130/80), and cholesterol (LDL <100). 7. Compliance at present is estimated to be {good/fair/poor:33178}. Efforts to improve compliance (if necessary) will be directed at {compliance:16716}. 8. Follow up: {NUMBERS; 0-10:33138} {time:11}

## 2018-09-02 ENCOUNTER — Ambulatory Visit: Payer: Medicare Other | Admitting: Sports Medicine

## 2018-09-02 ENCOUNTER — Encounter: Payer: Self-pay | Admitting: Sports Medicine

## 2018-09-02 VITALS — BP 127/78 | HR 81

## 2018-09-02 DIAGNOSIS — M19072 Primary osteoarthritis, left ankle and foot: Secondary | ICD-10-CM | POA: Diagnosis not present

## 2018-09-02 DIAGNOSIS — E118 Type 2 diabetes mellitus with unspecified complications: Secondary | ICD-10-CM

## 2018-09-02 DIAGNOSIS — M779 Enthesopathy, unspecified: Secondary | ICD-10-CM

## 2018-09-02 MED ORDER — TRIAMCINOLONE ACETONIDE 10 MG/ML IJ SUSP
10.0000 mg | Freq: Once | INTRAMUSCULAR | Status: AC
  Administered 2018-09-02: 10 mg

## 2018-09-02 NOTE — Progress Notes (Signed)
Subjective: Phyllis Campbell is a 84 y.o. female patient who presents to office for evaluation of left foot pain. Patient complains of progressive pain especially over the last 6 month in the left foot at the side that does not hurt to walk but only when pressed. Ranks pain 5/10 when pressed but does not interfere with activities or shoes. Blood sugar today was not recorded last A1c 6.4. Patient denies any other pedal complaints. Denies injury/trip/fall/sprain/any causative factors.  Review of Systems  All other systems reviewed and are negative.    Patient Active Problem List   Diagnosis Date Noted  . Chronic low back pain 12/31/2016  . Atherosclerosis of native arteries of extremity with intermittent claudication (HCC) 03/13/2012  . Hypertension complicating diabetes (HCC) 07/09/2011  . Allergic rhinitis due to pollen 07/09/2011  . DM (diabetes mellitus) with complications (HCC) 07/09/2011  . Hyperlipidemia associated with type 2 diabetes mellitus (HCC) 07/09/2011  . Arthritis 07/09/2011    Current Outpatient Medications on File Prior to Visit  Medication Sig Dispense Refill  . aspirin EC 81 MG tablet Take 81 mg by mouth daily.    . Chlorpheniramine-DM (CORICIDIN HBP COUGH/COLD PO) Take 2 tablets by mouth as needed.    . Ginkgo Biloba 40 MG TABS Take 60 mg by mouth as needed. Reported on 02/08/2016    . glucose blood (ONETOUCH VERIO) test strip USE ONE STRIP TO CHECK GLUCOSE ONCE DAILY AND AS NEEDED 100 each 3  . ibuprofen (ADVIL,MOTRIN) 200 MG tablet Take 200 mg by mouth every 6 (six) hours as needed. Reported on 02/08/2016    . Liniments (SALONPAS ARTHRITIS PAIN RELIEF EX) Apply topically.    Marland Kitchen. losartan-hydrochlorothiazide (HYZAAR) 50-12.5 MG tablet Take 1 tablet by mouth daily. 90 tablet 3  . meclizine (ANTIVERT) 12.5 MG tablet TAKE 1 TABLET BY MOUTH THREE TIMES DAILY AS NEEDED FOR  DIZZINESS 30 tablet 0  . methocarbamol (ROBAXIN) 500 MG tablet Take 1 tablet (500 mg total) by mouth at  bedtime as needed for muscle spasms. 30 tablet 1  . ONETOUCH DELICA LANCETS FINE MISC 1 each by Does not apply route daily. 100 each 6  . Pseudoephedrine-Ibuprofen (ADVIL COLD/SINUS PO) Take 2 tablets by mouth as needed.    . RESTASIS 0.05 % ophthalmic emulsion     . simvastatin (ZOCOR) 40 MG tablet Take 1 tablet (40 mg total) by mouth daily. 90 tablet 3   No current facility-administered medications on file prior to visit.     No Known Allergies  Objective:  General: Alert and oriented x3 in no acute distress  Dermatology: No open lesions bilateral lower extremities, no webspace macerations, no ecchymosis bilateral, all nails x 10 are well manicured with red polish to toenails patient goes for pedicures.  Vascular: Dorsalis Pedis and Posterior Tibial pedal pulses palpable, Capillary Fill Time 3 seconds, decreased pedal hair growth bilateral, no edema bilateral lower extremities, Temperature gradient within normal limits.  Neurology: Michaell CowingGross sensation intact via light touch bilateral, Protective sensation intact  with Phoebe PerchSemmes Weinstein Monofilament to all pedal sites, Position sense intact, vibratory intact bilateral, Deep tendon reflexes within normal limits bilateral, No babinski sign present bilateral. (- )Tinels sign bilateral.   Musculoskeletal: Mild tenderness with palpation at dorsal lateral left midfoot with palpable bone spur and with significant bunion deformity right greater than left but is currently asymptomatic,No pain with calf compression bilateral. There is a pes planus foot type strength within normal limits in all groups bilateral.   Gait: Nonantalgic gait  Assessment and Plan: Problem List Items Addressed This Visit      Endocrine   DM (diabetes mellitus) with complications (HCC)    Other Visit Diagnoses    Capsulitis    -  Primary   Relevant Medications   triamcinolone acetonide (KENALOG) 10 MG/ML injection 10 mg (Completed) (Start on 09/02/2018  9:45 PM)    Arthritis of left foot       Relevant Medications   triamcinolone acetonide (KENALOG) 10 MG/ML injection 10 mg (Completed) (Start on 09/02/2018  9:45 PM)       -Complete examination performed -Discussed treatement options for possible capsulitis with underlying arthritis at the dorsal lateral left foot -After oral consent and aseptic prep, injected a mixture containing 1 ml of 2%  plain lidocaine, 1 ml 0.5% plain marcaine, 0.5 ml of kenalog 10 and 0.5 ml of dexamethasone phosphate into the dorsal lateral left foot without complication. Post-injection care discussed with patient.  -Recommend good supportive shoes rest ice elevation and daily inspection of feet in the setting of diabetes -Patient to return to office as scheduled for diabetic foot exam or sooner if condition worsens.  If pain recurs would benefit from x-ray next visit.  Asencion Islam, DPM

## 2018-09-17 ENCOUNTER — Other Ambulatory Visit: Payer: Self-pay | Admitting: Family Medicine

## 2018-09-17 DIAGNOSIS — E1169 Type 2 diabetes mellitus with other specified complication: Secondary | ICD-10-CM

## 2018-09-17 DIAGNOSIS — E785 Hyperlipidemia, unspecified: Principal | ICD-10-CM

## 2018-09-18 ENCOUNTER — Ambulatory Visit
Admission: RE | Admit: 2018-09-18 | Discharge: 2018-09-18 | Disposition: A | Discharge status: Home / Self Care | Payer: Medicare Other | Source: Ambulatory Visit | Attending: Family Medicine | Admitting: Family Medicine

## 2018-09-18 DIAGNOSIS — E2839 Other primary ovarian failure: Secondary | ICD-10-CM

## 2018-09-19 DIAGNOSIS — M810 Age-related osteoporosis without current pathological fracture: Secondary | ICD-10-CM | POA: Insufficient documentation

## 2018-09-19 DIAGNOSIS — M8580 Other specified disorders of bone density and structure, unspecified site: Secondary | ICD-10-CM | POA: Insufficient documentation

## 2018-11-18 ENCOUNTER — Encounter: Payer: Self-pay | Admitting: Family Medicine

## 2018-11-18 ENCOUNTER — Ambulatory Visit: Payer: Medicare Other | Admitting: Family Medicine

## 2018-11-18 VITALS — BP 150/90 | HR 74 | Temp 98.0°F | Wt 203.6 lb

## 2018-11-18 DIAGNOSIS — M7918 Myalgia, other site: Secondary | ICD-10-CM

## 2018-11-18 NOTE — Progress Notes (Signed)
   Subjective:    Patient ID: Phyllis Campbell, female    DOB: 10/06/1932, 85 y.o.   MRN: [id number]  HPI On January 21, she coughed and felt some left-sided chest pain.  Pain was made worse with motion and went away after 5 minutes.  She had then had another episode this last Sunday when she was lying on the left side and felt a soreness in the left rib area.  Again made worse with motion.  No shortness of breath, diaphoresis, weakness. EMS was called on January 31.  EKG taken at that time was reviewed and is essentially negative. Review of Systems     Objective:   Physical Exam Alert and in no distress.  Cardiac exam shows regular rhythm without murmurs or gallops.  Lungs are clear to auscultation.  No chest wall tenderness.  No tenderness in the left lateral breast area.      Assessment & Plan:  Musculoskeletal pain I explained that her pain is musculoskeletal in nature and not at all related to her heart.  She expressed understanding.

## 2018-12-02 ENCOUNTER — Ambulatory Visit: Payer: Medicare Other | Admitting: Sports Medicine

## 2018-12-02 ENCOUNTER — Encounter: Payer: Self-pay | Admitting: Sports Medicine

## 2018-12-02 DIAGNOSIS — M779 Enthesopathy, unspecified: Secondary | ICD-10-CM | POA: Diagnosis not present

## 2018-12-02 DIAGNOSIS — E118 Type 2 diabetes mellitus with unspecified complications: Secondary | ICD-10-CM

## 2018-12-02 DIAGNOSIS — M19072 Primary osteoarthritis, left ankle and foot: Secondary | ICD-10-CM | POA: Diagnosis not present

## 2018-12-02 NOTE — Progress Notes (Signed)
Subjective: Phyllis Campbell is a 85 y.o. female patient who presents to office for follow up evaluation of left foot pain. Patient reports that her foot is feeling 100% better after injection; no pain on the left. Patient denies any other pedal complaints at this time.  FBS "good" saw PCP last month  Patient Active Problem List   Diagnosis Date Noted  . Osteopenia 09/19/2018  . Chronic low back pain 12/31/2016  . Atherosclerosis of native arteries of extremity with intermittent claudication (HCC) 03/13/2012  . Hypertension complicating diabetes (HCC) 07/09/2011  . Allergic rhinitis due to pollen 07/09/2011  . DM (diabetes mellitus) with complications (HCC) 07/09/2011  . Hyperlipidemia associated with type 2 diabetes mellitus (HCC) 07/09/2011  . Arthritis 07/09/2011    Current Outpatient Medications on File Prior to Visit  Medication Sig Dispense Refill  . aspirin EC 81 MG tablet Take 81 mg by mouth daily.    . Chlorpheniramine-DM (CORICIDIN HBP COUGH/COLD PO) Take 2 tablets by mouth as needed.    . Ginkgo Biloba 40 MG TABS Take 60 mg by mouth as needed. Reported on 02/08/2016    . glucose blood (ONETOUCH VERIO) test strip USE ONE STRIP TO CHECK GLUCOSE ONCE DAILY AND AS NEEDED 100 each 3  . ibuprofen (ADVIL,MOTRIN) 200 MG tablet Take 200 mg by mouth every 6 (six) hours as needed. Reported on 02/08/2016    . Liniments (SALONPAS ARTHRITIS PAIN RELIEF EX) Apply topically.    Marland Kitchen losartan-hydrochlorothiazide (HYZAAR) 50-12.5 MG tablet Take 1 tablet by mouth daily. 90 tablet 3  . meclizine (ANTIVERT) 12.5 MG tablet TAKE 1 TABLET BY MOUTH THREE TIMES DAILY AS NEEDED FOR  DIZZINESS 30 tablet 0  . methocarbamol (ROBAXIN) 500 MG tablet Take 1 tablet (500 mg total) by mouth at bedtime as needed for muscle spasms. 30 tablet 1  . ONETOUCH DELICA LANCETS FINE MISC 1 each by Does not apply route daily. 100 each 6  . Pseudoephedrine-Ibuprofen (ADVIL COLD/SINUS PO) Take 2 tablets by mouth as needed.    .  RESTASIS 0.05 % ophthalmic emulsion     . simvastatin (ZOCOR) 40 MG tablet TAKE 1 TABLET BY MOUTH  DAILY 90 tablet 3   No current facility-administered medications on file prior to visit.     No Known Allergies  Objective:  General: Alert and oriented x3 in no acute distress  Dermatology: No open lesions bilateral lower extremities, no webspace macerations, no ecchymosis bilateral, all nails x 10 are well manicured with red polish to toenails patient goes for pedicures.  Vascular: Dorsalis Pedis and Posterior Tibial pedal pulses palpable, Capillary Fill Time 3 seconds, decreased pedal hair growth bilateral, no edema bilateral lower extremities, Temperature gradient within normal limits.  Neurology: Michaell Cowing sensation intact via light touch bilateral, Protective sensation intact  with Phoebe Perch Monofilament to all pedal sites, Position sense intact, vibratory intact bilateral, Deep tendon reflexes within normal limits bilateral, No babinski sign present bilateral. (- )Tinels sign bilateral.   Musculoskeletal: No tenderness with palpation at dorsal lateral left midfoot with palpable bone spur and with significant bunion deformity right greater than left but is currently asymptomatic,No pain with calf compression bilateral. There is a pes planus foot type strength within normal limits in all groups bilateral.   Gait: Nonantalgic gait  Assessment and Plan: Problem List Items Addressed This Visit      Endocrine   DM (diabetes mellitus) with complications (HCC)    Other Visit Diagnoses    Capsulitis    -  Primary   Arthritis of left foot          -Complete examination performed -No recurrent left foot pain -Recommend for prevention to use good supportive shoes rest ice elevation and daily inspection of feet in the setting of diabetes -Patient to return to office as needed or sooner if problems reoccur  Asencion Islam, DPM

## 2018-12-31 ENCOUNTER — Ambulatory Visit: Payer: Medicare Other | Admitting: Family Medicine

## 2018-12-31 ENCOUNTER — Other Ambulatory Visit: Payer: Self-pay

## 2018-12-31 ENCOUNTER — Encounter: Payer: Self-pay | Admitting: Family Medicine

## 2018-12-31 VITALS — BP 122/80 | HR 80 | Temp 98.1°F | Wt 207.6 lb

## 2018-12-31 DIAGNOSIS — H6121 Impacted cerumen, right ear: Secondary | ICD-10-CM | POA: Diagnosis not present

## 2018-12-31 DIAGNOSIS — H9201 Otalgia, right ear: Secondary | ICD-10-CM | POA: Diagnosis not present

## 2018-12-31 NOTE — Progress Notes (Signed)
   Subjective:    Patient ID: Phyllis Campbell, female    DOB: [birth year], 85 y.o.   MRN: [id number]  HPI She complains of a 1 day history of right-sided temporal headache as well as earache that has intermittent in nature.  She has had difficulty hearing from that ear No fever, chills, blurred or double vision, nausea or vomiting.   Review of Systems     Objective:   Physical Exam Alert and in no distress.  Cerumen noted in the right canal and removed without difficulty.  The TM and canal is normal.  No tenderness over the TMJ or over the temporal artery.  Neck is supple without adenopathy.  Left TM and canal are normal.       Assessment & Plan:  Impacted cerumen of right ear  Earache on right Recommended supportive care however the symptoms get worse, she will call for further evaluation.

## 2019-01-28 ENCOUNTER — Other Ambulatory Visit: Payer: Self-pay

## 2019-01-28 ENCOUNTER — Ambulatory Visit: Payer: Medicare Other | Admitting: Family Medicine

## 2019-01-28 ENCOUNTER — Encounter: Payer: Self-pay | Admitting: Family Medicine

## 2019-01-28 VITALS — Temp 98.6°F | Wt 203.0 lb

## 2019-01-28 DIAGNOSIS — E785 Hyperlipidemia, unspecified: Secondary | ICD-10-CM

## 2019-01-28 DIAGNOSIS — E118 Type 2 diabetes mellitus with unspecified complications: Secondary | ICD-10-CM | POA: Diagnosis not present

## 2019-01-28 DIAGNOSIS — E669 Obesity, unspecified: Secondary | ICD-10-CM | POA: Diagnosis not present

## 2019-01-28 DIAGNOSIS — J301 Allergic rhinitis due to pollen: Secondary | ICD-10-CM

## 2019-01-28 DIAGNOSIS — E1159 Type 2 diabetes mellitus with other circulatory complications: Secondary | ICD-10-CM | POA: Diagnosis not present

## 2019-01-28 DIAGNOSIS — E1169 Type 2 diabetes mellitus with other specified complication: Secondary | ICD-10-CM

## 2019-01-28 DIAGNOSIS — M1990 Unspecified osteoarthritis, unspecified site: Secondary | ICD-10-CM

## 2019-01-28 DIAGNOSIS — I10 Essential (primary) hypertension: Secondary | ICD-10-CM

## 2019-01-28 NOTE — Progress Notes (Signed)
Subjective:    Patient ID: Phyllis Campbell, female    DOB: Jun 15, 1932, 85 y.o.   MRN: [id number] Documentation for virtual telephone encounter.  Documentation for virtual audio and video telecommunications through Zoom encounter: The patient was located at home. The provider was located in the office. The patient did consent to this visit and is aware of possible charges through their insurance for this visit. The other persons participating in this telemedicine service were none. Time spent on call was 5 minutes and in review of previous records >25 minutes total.  This virtual service is not related to other E/M service within previous 7 days.  Phyllis Campbell H Danley is a 85 y.o. female who presents for follow-up of Type 2 diabetes mellitus.  Patient is checking home blood sugars.   Home blood sugar records: meter record How often is blood sugars being checked: BID fasting two hours post meal< 118 Current symptoms/problems include none at this time. Daily foot checks: yes   Any foot concerns: none Last eye exam: 01/2018 she has an appointment scheduled for July Exercise: pt, walking  Her allergies seem to be under good control.  She continues on simvastatin as well as losartan/HCTZ without problems.  Presently she is not on a antidiabetic medication.  She has minimal difficulty with arthritis.  She does take OTC vitamins.  She has no chest pain, shortness of breath, PND.  Life in general is treating her fairly well. The following portions of the patient's history were reviewed and updated as appropriate: allergies, current medications, past medical history, past social history and problem list.  ROS as in subjective above.     Objective:    Physical Exam Alert and in no distress otherwise not examined.  There were no vitals taken for this visit.  Lab Review Diabetic Labs Latest Ref Rng & Units 09/01/2018 04/08/2018 11/11/2017 07/10/2017 02/19/2017  HbA1c 4.0 - 5.6 % 6.4(A) 6.2(A) 6.4 6.4  6.6  Microalbumin mg/L - - 9.5 - -  Micro/Creat Ratio - - - 16.1 - -  Chol 100 - 199 mg/dL - - 621132 - -  HDL >30>39 mg/dL - - 56 - -  Calc LDL 0 - 99 mg/dL - - 66 - -  Triglycerides 0 - 149 mg/dL - - 51 - -  Creatinine 0.57 - 1.00 mg/dL - - 8.650.79 - -   BP/Weight 12/31/2018 11/18/2018 09/02/2018 09/01/2018 08/25/2018  Systolic BP 122 150 127 126 125  Diastolic BP 80 90 78 72 67  Wt. (Lbs) 207.6 203.6 - 202.4 202  BMI 35.63 34.95 - 34.74 34.67   Foot/eye exam completion dates Latest Ref Rng & Units 11/11/2017 11/28/2015  Eye Exam No Retinopathy - No Retinopathy  Foot Form Completion - Done -    Clarene Campbell  reports that she has never smoked. She has never used smokeless tobacco. She reports that she does not drink alcohol or use drugs.     Assessment & Plan:    DM (diabetes mellitus) with complications (HCC) - Plan: CBC with Differential/Platelet, Comprehensive metabolic panel, Lipid panel  Hyperlipidemia associated with type 2 diabetes mellitus (HCC) - Plan: Lipid panel  Hypertension complicating diabetes (HCC) - Plan: CBC with Differential/Platelet, Comprehensive metabolic panel  Obesity (BMI 35.0-39.9 without comorbidity)  Non-seasonal allergic rhinitis due to pollen  Arthritis   1. Rx changes: none 2. Education: Reviewed 'ABCs' of diabetes management (respective goals in parentheses):  A1C (<7), blood pressure (<130/80), and cholesterol (LDL <100). 3. Compliance at present is estimated  to be adequate. Efforts to improve compliance (if necessary) will be directed at increased exercise. 4. Follow up: 4 months Blood work was ordered for 1 month.  Check here in about 4 months.

## 2019-02-23 ENCOUNTER — Other Ambulatory Visit: Payer: Self-pay

## 2019-02-23 ENCOUNTER — Other Ambulatory Visit: Payer: Medicare Other

## 2019-02-23 DIAGNOSIS — I152 Hypertension secondary to endocrine disorders: Secondary | ICD-10-CM

## 2019-02-23 DIAGNOSIS — E1169 Type 2 diabetes mellitus with other specified complication: Secondary | ICD-10-CM

## 2019-02-23 DIAGNOSIS — E1159 Type 2 diabetes mellitus with other circulatory complications: Secondary | ICD-10-CM

## 2019-02-23 DIAGNOSIS — E118 Type 2 diabetes mellitus with unspecified complications: Secondary | ICD-10-CM

## 2019-02-23 LAB — COMPREHENSIVE METABOLIC PANEL
ALT: 18 IU/L (ref 0–32)
AST: 23 IU/L (ref 0–40)
Albumin/Globulin Ratio: 1.6 (ref 1.2–2.2)
Albumin: 4.4 g/dL (ref 3.6–4.6)
Alkaline Phosphatase: 97 IU/L (ref 39–117)
BUN/Creatinine Ratio: 17 (ref 12–28)
BUN: 15 mg/dL (ref 8–27)
Bilirubin Total: 0.6 mg/dL (ref 0.0–1.2)
CO2: 24 mmol/L (ref 20–29)
Calcium: 10 mg/dL (ref 8.7–10.3)
Chloride: 101 mmol/L (ref 96–106)
Creatinine, Ser: 0.89 mg/dL (ref 0.57–1.00)
GFR calc Af Amer: 68 mL/min/{1.73_m2} (ref >59)
GFR calc non Af Amer: 59 mL/min/{1.73_m2} — ABNORMAL LOW (ref >59)
Globulin, Total: 2.8 g/dL (ref 1.5–4.5)
Glucose: 107 mg/dL — ABNORMAL HIGH (ref 65–99)
Potassium: 4.1 mmol/L (ref 3.5–5.2)
Sodium: 140 mmol/L (ref 134–144)
Total Protein: 7.2 g/dL (ref 6.0–8.5)

## 2019-02-23 LAB — CBC WITH DIFFERENTIAL/PLATELET
Basophils Absolute: 0 10*3/uL (ref 0.0–0.2)
Basos: 1 %
EOS (ABSOLUTE): 0.1 10*3/uL (ref 0.0–0.4)
Eos: 1 %
Hematocrit: 37.9 % (ref 34.0–46.6)
Hemoglobin: 12.5 g/dL (ref 11.1–15.9)
Immature Grans (Abs): 0 10*3/uL (ref 0.0–0.1)
Immature Granulocytes: 0 %
Lymphocytes Absolute: 1.7 10*3/uL (ref 0.7–3.1)
Lymphs: 39 %
MCH: 30.9 pg (ref 26.6–33.0)
MCHC: 33 g/dL (ref 31.5–35.7)
MCV: 94 fL (ref 79–97)
Monocytes Absolute: 0.4 10*3/uL (ref 0.1–0.9)
Monocytes: 9 %
Neutrophils Absolute: 2.1 10*3/uL (ref 1.4–7.0)
Neutrophils: 50 %
Platelets: 207 10*3/uL (ref 150–450)
RBC: 4.04 x10E6/uL (ref 3.77–5.28)
RDW: 13.2 % (ref 11.7–15.4)
WBC: 4.3 10*3/uL (ref 3.4–10.8)

## 2019-02-23 LAB — LIPID PANEL
Chol/HDL Ratio: 2.4 ratio (ref 0.0–4.4)
Cholesterol, Total: 137 mg/dL (ref 100–199)
HDL: 57 mg/dL (ref >39)
LDL Calculated: 67 mg/dL (ref 0–99)
Triglycerides: 63 mg/dL (ref 0–149)
VLDL Cholesterol Cal: 13 mg/dL (ref 5–40)

## 2019-03-11 ENCOUNTER — Other Ambulatory Visit: Payer: Self-pay

## 2019-03-11 ENCOUNTER — Encounter: Payer: Self-pay | Admitting: Family Medicine

## 2019-03-11 ENCOUNTER — Ambulatory Visit (INDEPENDENT_AMBULATORY_CARE_PROVIDER_SITE_OTHER): Payer: Medicare Other | Admitting: Family Medicine

## 2019-03-11 VITALS — BP 134/82 | HR 70 | Temp 97.8°F | Ht 65.0 in | Wt 207.8 lb

## 2019-03-11 DIAGNOSIS — M545 Low back pain: Secondary | ICD-10-CM | POA: Diagnosis not present

## 2019-03-11 DIAGNOSIS — E118 Type 2 diabetes mellitus with unspecified complications: Secondary | ICD-10-CM | POA: Diagnosis not present

## 2019-03-11 DIAGNOSIS — I10 Essential (primary) hypertension: Secondary | ICD-10-CM

## 2019-03-11 DIAGNOSIS — E1169 Type 2 diabetes mellitus with other specified complication: Secondary | ICD-10-CM | POA: Diagnosis not present

## 2019-03-11 DIAGNOSIS — M1990 Unspecified osteoarthritis, unspecified site: Secondary | ICD-10-CM | POA: Diagnosis not present

## 2019-03-11 DIAGNOSIS — G8929 Other chronic pain: Secondary | ICD-10-CM

## 2019-03-11 DIAGNOSIS — E785 Hyperlipidemia, unspecified: Secondary | ICD-10-CM

## 2019-03-11 DIAGNOSIS — E1159 Type 2 diabetes mellitus with other circulatory complications: Secondary | ICD-10-CM

## 2019-03-11 LAB — POCT GLYCOSYLATED HEMOGLOBIN (HGB A1C): Hemoglobin A1C: 6.3 % — AB (ref 4.0–5.6)

## 2019-03-11 NOTE — Patient Instructions (Signed)
  Phyllis Campbell , Thank you for taking time to come for your Medicare Wellness Visit. I appreciate your ongoing commitment to your health goals. Please review the following plan we discussed and let me know if I can assist you in the future.   These are the goals we discussed: Call your neurosurgeon to get your next epidural injection.  Also make sure you get your eyes checked.  We will have home health come out and do an assessment to find out your needs. This is a list of the screening recommended for you and due dates:  Health Maintenance  Topic Date Due  . Eye exam for diabetics  11/27/2016  . Complete foot exam   11/11/2018  . Hemoglobin A1C  03/02/2019  . Flu Shot  05/09/2019  . Tetanus Vaccine  02/20/2027  . DEXA scan (bone density measurement)  Completed  . Pneumonia vaccines  Completed

## 2019-03-11 NOTE — Progress Notes (Signed)
Phyllis Campbell is a 85 y.o. female who presents for annual wellness visit and follow-up on chronic medical conditions.  She has had difficulty with low back pain and has seen orthopedics as well as neurosurgery.  She has had 2 epidural injections which did help.  She is planning to have another 1 in the near future.  She has had difficulty because of her back pain making her unstable and now he is using a cane.  This is interfered with her ADLs.  She also notes that she seems to be more forgetful and on occasion has left things on the stove and forgot to go back and check them.  She is now living with her daughter.  She does have a previous history of CVA. Apparently her daughter is taking time off from work and wants an FMLA form filled out. Immunizations and Health Maintenance Immunization History  Administered Date(s) Administered  . DTaP 08/11/1998  . Influenza Split 07/09/2011, 07/30/2012  . Influenza Whole 09/10/2006, 09/16/2007, 08/09/2009  . Influenza, High Dose Seasonal PF 07/28/2013, 08/10/2014, 08/16/2015, 06/13/2016, 07/10/2017, 08/07/2018  . Pneumococcal Conjugate-13 08/29/2015  . Pneumococcal Polysaccharide-23 05/12/2006, 10/19/2013  . Tdap 05/28/2007, 02/19/2017  . Zoster 05/12/2006  . Zoster Recombinat (Shingrix) 02/19/2017, 05/03/2017   Health Maintenance Due  Topic Date Due  . OPHTHALMOLOGY EXAM  11/27/2016  . FOOT EXAM  11/11/2018  . HEMOGLOBIN A1C  03/02/2019    Last Pap smear:N/A Last mammogram:N/A Last colonoscopy:N/A Last DEXA: Dentist: Ophtho:[facility] Exercise:no  Other doctors caring for patient include:Bevis Blackmon  Advanced directives:No ;Info given Does Patient Have a Medical Advance Directive?: No Would patient like information on creating a medical advance directive?: Yes (MAU/Ambulatory/Procedural Areas - Information given)  Depression screen:  See questionnaire below.  Depression screen [facility] 2/9 03/11/2019 08/07/2018 07/10/2017 06/13/2016 08/29/2015   Decreased Interest 0 0 0 0 0  Down, Depressed, Hopeless 0 0 0 0 0  PHQ - 2 Score 0 0 0 0 0    Fall Risk Screen: see questionnaire below. Fall Risk  03/11/2019 08/07/2018 07/10/2017 06/13/2016 08/29/2015  Falls in the past year? 0 Yes Yes No No  Number falls in past yr: - 1 1 - -  Injury with Fall? - No No - -  Risk for fall due to : - - Impaired balance/gait - -    ADL screen:  See questionnaire below  Functional Status Survey: Is the patient deaf or have difficulty hearing?: No Does the patient have difficulty seeing, even when wearing glasses/contacts?: Yes Does the patient have difficulty concentrating, remembering, or making decisions?: No Does the patient have difficulty walking or climbing stairs?: Yes Does the patient have difficulty dressing or bathing?: Yes Does the patient have difficulty doing errands alone such as visiting a doctor's office or shopping?: Yes   Review of Systems Constitutional: -, -unexpected weight change, -anorexia, -fatigue Gastroenterology: -abdominal pain, -nausea, -vomiting, -diarrhea, -constipation, -dysphagia Hematology: -bleeding or bruising problems Ophthalmology: -vision changes,     PHYSICAL EXAM:  BP 134/82 (BP Location: Left Arm, Patient Position: Sitting)   Pulse 70   Temp 97.8 F (36.6 C)   Ht  (1.651 m)   Wt 207 lb 12.8 oz (94.3 kg)   SpO2 96%   BMI 34.58 kg/m   General Appearance: Alert, cooperative, no distress, appears stated age Head: Normocephalic, without obvious abnormality, atraumatic Eyes: PERRL, conjunctiva/corneas clear, EOM's intact, fundi benign Ears: Normal TM's and external ear canals Nose: Nares normal,ESTA CARMONal, no drainage or sinus  tenderness Throat: Lips, mucosa, and tongue normal; teeth and gums normal Neck: Supple, no lymphadenopathy;  thyroid:  no enlargement/tenderness/nodules; no carotid bruit or JVD Lungs: Clear to auscultation bilaterally without wheezes, rales or ronchi; respirations  unlabored Heart: Regular rate and rhythm, S1 and S2 normal, no murmur, rubor gallop Abdomen: Soft, non-tender, nondistended, normoactive bowel sounds,  no masses, no hepatosplenomegaly Extremities: No clubbing, cyanosis or edema Pulses: 2+ and symmetric all extremities Skin:  Skin color, texture, turgor normal, no rashes or lesions Lymph nodes: Cervical, supraclavicular, and axillary nodes normal Neurologic:  CNII-XII intact, normal strength, sensation and gait; reflexes 2+ and symmetric throughout Psych: Normal mood, affect, hygiene and grooming. MMSE 29 Hemoglobin A1c 6.3 ASSESSMENT/PLAN: DM (diabetes mellitus) with complications (HCC) - Plan: POCT glycosylated hemoglobin (Hb A1C)  Chronic low back pain, unspecified back pain laterality, unspecified whether sciatica present - Plan: Ambulatory referral to Home Health  Arthritis  Hyperlipidemia associated with type 2 diabetes mellitus (HCC)  Hypertension complicating diabetes (HCC) I will have home health come out and assess her needs in terms of ADLs as well as physical functioning due to her back pain.  She is to call her neurosurgeon to get the second injection. Explained to her at this time that I cannot [facility] the [facility] without further information.  Hopefully we can get more information on possible services available to her so her daughter does not have to take time out from work..  Immunization recommendations discussed.   Even though she complains of memory issues, her MMSE is quite good. Medicare Attestation I have personally reviewed: The patient's medical and social history Their use of alcohol, tobacco or illicit drugs Their current medications and supplements The patient's functional ability including ADLs,fall risks, home safety risks, cognitive, and hearing and visual impairment Diet and physical activities Evidence for depression or mood disorders  The patient's weight, height, and BMI have been recorded in the  chart.  I have made referrals, counseling, and provided education to the patient based on review of the above and I have provided the patient with a written personalized care plan for preventive services.     Sharlot GowdaJohn Lalonde, MD   03/11/2019  Cherene JulianVernia H Sisk is a 85 y.o. female who presents for annual wellness visit and follow-up on chronic medical conditions.  She has the following concerns:  Immunizations and Health Maintenance Immunization History  Administered Date(s) Administered  . DTaP 08/11/1998  . Influenza Split 07/09/2011, 07/30/2012  . Influenza Whole 09/10/2006, 09/16/2007, 08/09/2009  . Influenza, High Dose Seasonal PF 07/28/2013, 08/10/2014, 08/16/2015, 06/13/2016, 07/10/2017, 08/07/2018  . Pneumococcal Conjugate-13 08/29/2015  . Pneumococcal Polysaccharide-23 05/12/2006, 10/19/2013  . Tdap 05/28/2007, 02/19/2017  . Zoster 05/12/2006  . Zoster Recombinat (Shingrix) 02/19/2017, 05/03/2017   Health Maintenance Due  Topic Date Due  . OPHTHALMOLOGY EXAM  11/27/2016  . FOOT EXAM  11/11/2018  . HEMOGLOBIN A1C  03/02/2019    Last Pap smear:aged out  Last mammogram: over three yearsa Last colonoscopy:over five years Last DEXA:09/18/18 Dentist: over ten years Ophtho: 11/28/15 Exercise:walking around house  Other doctors caring for patient include: Dr. Marylene LandStover podiatry, Dr. [facility],   Advanced directives: Does Patient Have a Medical Advance Directive?: No Would patient like information on creating a medical advance directive?: Yes (MAU/Ambulatory/Procedural Areas - Information given)  Depression screen:  See questionnaire below.  Dep[facility] 2[facility]9 03/11/2019 08/07/2018 07/10/2017 06/13/2016 08/29/2015  Decreased Interest 0 0 0 0 0  Down, Depressed, Hopeless 0 0 0 0 0  PHQ - 2 Score 0 0 0 0 0    Fall Risk Screen: see questionnaire below. Fall Risk  03/11/2019 08/07/2018 07/10/2017 06/13/2016 08/29/2015  Falls in the past year? 0 Yes Yes No No  Number falls in past  yr: - 1 1 - -  Injury with Fall? - No No - -  Risk for fall due to : - - Impaired balance/gait - -    ADL screen:  See questionnaire below Functional Status Survey: Is the patient deaf or have difficulty hearing?: No Does the patient have difficulty seeing, even when wearing glasses/contacts?: Yes Does the patient have difficulty concentrating, remembering, or making decisions?: No Does the patient have difficulty walking or climbing stairs?: Yes Does the patient have difficulty dressing or bathing?: Yes Does the patient have difficulty doing errands alone such as visiting a doctor's office or shopping?: Yes   Review of Systems Constitutional: -, -unexpected weight change, -anorexia, -fatigue Allergy: -sneezing, -itching, -congestion Dermatology: denies changing moles, rash, lumps ENT: -runny nose, -ear pain, -sore throat,  Cardiology:  -chest pain, -palpitations, -orthopnea, Respiratory: -cough, -shortness of breath, -dyspnea on exertion, -wheezing,  Gastroenterology: -abdominal pain, -nausea, -vomiting, -diarrhea, -constipation, -dysphagia Hematology: -bleeding or bruising problems Musculoskeletal: -arthralgias, -myalgias, -joint swelling, -back pain, - Ophthalmology: -vision changes,  Urology: -dysuria, -difficulty urinating,  -urinary frequency, -urgency, incontinence Neurology: -, -numbness, , -memory loss, -falls, -dizziness    PHYSICAL EXAM:  BP 134/82 (BP Location: Left Arm, Patient Position: Sitting)   Pulse 70   Temp 97.8 F (36.6 C)   Ht  (1.651 m)   Wt 207 lb 12.8 oz (94.3 kg)   SpO2 96%   BMI 34.58 kg/m   General Appearance: Alert, cooperative, no distress, appears stated age Head: Normocephalic, without obvious abnormality, atraumatic Eyes: PERRL, conjunctiva/corneas clear, EOM's intact, fundi benign Ears: Normal TM's and external ear canals Nose: Nares normal, mucosa normal, no drainage or sinus tenderness Throat: Lips, mucosa, and tongue normal;  teeth and gums normal Neck: Supple, no lymphadenopathy;  thyroid:  no enlargement/tenderness/nodules; no carotid bruit or JVD Lungs: Clear to auscultation bilaterally without wheezes, rales or ronchi; respirations unlabored Heart: Regular rate and rhythm, S1 and S2 normal, no murmur, rubor gallop Abdomen: Soft, non-tender, nondistended, normoactive bowel sounds,  no masses, no hepatosplenomegaly Extremities: No clubbing, cyanosis or edema Pulses: 2+ and symmetric all extremities Skin:  Skin color, texture, turgor normal, no rashes or lesions Lymph nodes: Cervical, supraclavicular, and axillary nodes normal Neurologic:  CNII-XII intact, normal strength, sensation and gait; reflexes 2+ and symmetric throughout Psych: Normal mood, affect, hygiene and grooming.  ASSESSMENT/PLAN:    Discussed monthly self breast exams and yearly mammograms; at least 30 minutes of aerobic activity at least 5 days/week and weight-bearing exercise 2x/week; proper sunscreen use reviewed; healthy diet, including goals of calcium and vitamin D intake and alcohol recommendations (less than or equal to 1 drink/day) reviewed; regular seatbelt use; changing batteries in smoke detectors.  Immunization recommendations discussed.  Colonoscopy recommendations reviewed   Medicare Attestation I have personally reviewed: The patient's medical and social history Their use of alcohol, tobacco or illicit drugs Their current medications and supplements The patient's functional ability including ADLs,fall risks, home safety risks, cognitive, and hearing and visual impairment Diet and physical activities Evidence for depression or mood disorders  The patient's weight, height, and BMI have been recorded in the chart.  I have made referrals, counseling, and provided education to the patient based on review of the above and I  have provided the patient with a written personalized care plan for preventive services.     Sharlot Gowda,  MD   03/11/2019

## 2019-05-05 LAB — HM DIABETES EYE EXAM

## 2019-05-18 ENCOUNTER — Other Ambulatory Visit: Payer: Self-pay | Admitting: Family Medicine

## 2019-05-18 DIAGNOSIS — E1159 Type 2 diabetes mellitus with other circulatory complications: Secondary | ICD-10-CM

## 2019-06-01 ENCOUNTER — Encounter: Payer: Self-pay | Admitting: Family Medicine

## 2019-06-01 ENCOUNTER — Other Ambulatory Visit: Payer: Self-pay

## 2019-06-01 ENCOUNTER — Ambulatory Visit: Payer: Medicare Other | Admitting: Family Medicine

## 2019-06-01 VITALS — Temp 98.6°F | Wt 204.0 lb

## 2019-06-01 DIAGNOSIS — J069 Acute upper respiratory infection, unspecified: Secondary | ICD-10-CM

## 2019-06-01 DIAGNOSIS — R6889 Other general symptoms and signs: Secondary | ICD-10-CM

## 2019-06-01 DIAGNOSIS — Z20822 Contact with and (suspected) exposure to covid-19: Secondary | ICD-10-CM

## 2019-06-01 NOTE — Progress Notes (Signed)
   Subjective:    Patient ID: Phyllis Campbell, female    DOB: Apr 28, 1932, 85 y.o.   MRN: [id number]  HPI Documentation for virtual telephone encounter. Documentation for virtual audio and video telecommunications through doximity encounter: The patient was located at home. The provider was located in the office. The patient did consent to this visit and is aware of possible charges through their insurance for this visit. The other persons participating in this telemedicine service was her daughter Time spent on call was 10 minutes total. This virtual service is not related to other E/M service within previous 7 days. She states that on Friday she developed nasal congestion, sore throat and a cough that is now intermittently productive but no fever, chills, smell or taste changes.  She has not been exposed to anybody that has had COVID.  She has been using a decongestant/cough medicine but still having difficulty with coughing.  Review of Systems     Objective:   Physical Exam Alert and in no distress with a nasal voice pattern.      Assessment & Plan:  Suspected Covid-19 Virus Infection  Viral upper respiratory tract infection Recommend she get tested for COVID today which she will.  Her daughter was recently tested and was negative.  She is to continue to use a decongestant and possibly NyQuil at night.  She will keep me informed as to worsening of her symptoms.  She understood this.

## 2019-06-02 ENCOUNTER — Other Ambulatory Visit: Payer: Medicare Other

## 2019-06-02 LAB — NOVEL CORONAVIRUS, NAA: SARS-CoV-2, NAA: NOT DETECTED

## 2019-06-03 ENCOUNTER — Telehealth: Payer: Self-pay | Admitting: General Practice

## 2019-06-03 NOTE — Telephone Encounter (Signed)
Negative COVID results given. Patient results "NOT Detected." Caller expressed understanding. ° °

## 2019-06-18 ENCOUNTER — Other Ambulatory Visit: Payer: Self-pay

## 2019-06-18 ENCOUNTER — Other Ambulatory Visit (INDEPENDENT_AMBULATORY_CARE_PROVIDER_SITE_OTHER): Payer: Medicare Other

## 2019-06-18 DIAGNOSIS — Z23 Encounter for immunization: Secondary | ICD-10-CM | POA: Diagnosis not present

## 2019-07-13 ENCOUNTER — Encounter: Payer: Self-pay | Admitting: Family Medicine

## 2019-07-13 ENCOUNTER — Other Ambulatory Visit: Payer: Self-pay

## 2019-07-13 ENCOUNTER — Ambulatory Visit: Payer: Medicare Other | Admitting: Family Medicine

## 2019-07-13 VITALS — BP 126/72 | HR 70 | Temp 96.4°F | Wt 209.8 lb

## 2019-07-13 DIAGNOSIS — E118 Type 2 diabetes mellitus with unspecified complications: Secondary | ICD-10-CM | POA: Diagnosis not present

## 2019-07-13 DIAGNOSIS — I70213 Atherosclerosis of native arteries of extremities with intermittent claudication, bilateral legs: Secondary | ICD-10-CM

## 2019-07-13 DIAGNOSIS — M545 Low back pain, unspecified: Secondary | ICD-10-CM

## 2019-07-13 DIAGNOSIS — E1159 Type 2 diabetes mellitus with other circulatory complications: Secondary | ICD-10-CM | POA: Diagnosis not present

## 2019-07-13 DIAGNOSIS — E785 Hyperlipidemia, unspecified: Secondary | ICD-10-CM

## 2019-07-13 DIAGNOSIS — I10 Essential (primary) hypertension: Secondary | ICD-10-CM

## 2019-07-13 DIAGNOSIS — E1169 Type 2 diabetes mellitus with other specified complication: Secondary | ICD-10-CM | POA: Diagnosis not present

## 2019-07-13 DIAGNOSIS — G8929 Other chronic pain: Secondary | ICD-10-CM

## 2019-07-13 DIAGNOSIS — M1990 Unspecified osteoarthritis, unspecified site: Secondary | ICD-10-CM

## 2019-07-13 LAB — POCT GLYCOSYLATED HEMOGLOBIN (HGB A1C): Hemoglobin A1C: 6.6 % — AB (ref 4.0–5.6)

## 2019-07-13 NOTE — Progress Notes (Signed)
  Subjective:    Patient ID: Phyllis Campbell, female    DOB: 01/22/1932, 85 Campbell.o.   MRN: [id number]  Phyllis Campbell is a 85 Campbell.o. female who presents for follow-up of Type 2 diabetes mellitus.  Home blood sugar records: meter records below 100 fasting Current symptoms/problems include none at this time. Daily foot checks:yes   Any foot concerns: right great toe numbness.  She does have a history of chronic low back pain with right L5 involvement.  She has had 2 epidural injection and is is supposed to be scheduled for another 1. Exercise: walking  Diet:regular She continues on losartan/HCTZ as well as simvastatin and is having no symptoms from either of these.  She is on aspirin and does use ibuprofen for occasional aches and pains from arthritis.  She does have a history of atherosclerosis and has been seen by cardiovascular.  The recommendation was annual screening and maximal medical therapy. The following portions of the patient's history were reviewed and updated as appropriate: allergies, current medications, past medical history, past social history and problem list.  ROS as in subjective above.     Objective:    Physical Exam Alert and in no distress otherwise not examined. No foot drop is noted with good dorsiflexion.  At this time she has no decreased sensation to the medial aspect of the great toe. Hemoglobin A1c is 6.6 Recent blood work was reviewed  Lab Review Diabetic Labs Latest Ref Rng & Units 03/11/2019 02/23/2019 09/01/2018 04/08/2018 11/11/2017  HbA1c 4.0 - 5.6 % 6.3(A) - 6.4(A) 6.2(A) 6.4  Microalbumin mg/L - - - - 9.5  Micro/Creat Ratio - - - - - 16.1  Chol 100 - 199 mg/dL - 137 - - 132  HDL >39 mg/dL - 57 - - 56  Calc LDL 0 - 99 mg/dL - 67 - - 66  Triglycerides 0 - 149 mg/dL - 63 - - 51  Creatinine 0.57 - 1.00 mg/dL - 0.89 - - 0.79   BP/Weight 07/13/2019 06/01/2019 03/11/2019 01/28/2019 0/81/4481  Systolic BP 856 - 314 - 970  Diastolic BP 72 - 82 - 80  Wt. (Lbs) 209.8  204 207.8 203 207.6  BMI 34.91 33.95 34.58 34.84 35.63   Foot/eye exam completion dates Latest Ref Rng & Units 03/11/2019 11/11/2017  Eye Exam No Retinopathy - -  Foot Form Completion - Done Done    Amany  reports that she has never smoked. She has never used smokeless tobacco. She reports that she does not drink alcohol or use drugs.     Assessment & Plan:    DM (diabetes mellitus) with complications (Phyllis Campbell)  Chronic low back pain, unspecified back pain laterality, unspecified whether sciatica present  Hypertension complicating diabetes (Phyllis Campbell)  Hyperlipidemia associated with type 2 diabetes mellitus (Phyllis Campbell)  Arthritis  Atherosclerosis of native artery of both lower extremities with intermittent claudication (Phyllis Campbell)   1. Rx changes: none 2. Education: Reviewed 'ABCs' of diabetes management (respective goals in parentheses):  A1C (<7), blood pressure (<130/80), and cholesterol (LDL <100). 3. Compliance at present is estimated to be good. Efforts to improve compliance (if necessary) will be directed at increased exercise. 4. Follow up: 4 months Encouraged her to call orthopedics to get her second epidural injection.  Explained that the numbness to the medial aspect of the toe was related to the pinched nerve in her back.  Encouraged her to continue to take good care of herself.

## 2019-07-13 NOTE — Addendum Note (Signed)
Addended by: Elyse Jarvis on: 07/13/2019 09:27 AM   Modules accepted: Orders

## 2019-07-23 ENCOUNTER — Other Ambulatory Visit: Payer: Self-pay | Admitting: Family Medicine

## 2019-07-23 DIAGNOSIS — E119 Type 2 diabetes mellitus without complications: Secondary | ICD-10-CM

## 2019-07-23 DIAGNOSIS — E1159 Type 2 diabetes mellitus with other circulatory complications: Secondary | ICD-10-CM

## 2019-07-27 ENCOUNTER — Encounter: Payer: Self-pay | Admitting: Orthopaedic Surgery

## 2019-07-27 ENCOUNTER — Other Ambulatory Visit: Payer: Self-pay

## 2019-07-27 ENCOUNTER — Ambulatory Visit (INDEPENDENT_AMBULATORY_CARE_PROVIDER_SITE_OTHER): Payer: Medicare Other | Admitting: Orthopaedic Surgery

## 2019-07-27 DIAGNOSIS — M5441 Lumbago with sciatica, right side: Secondary | ICD-10-CM

## 2019-07-27 DIAGNOSIS — M5442 Lumbago with sciatica, left side: Secondary | ICD-10-CM | POA: Diagnosis not present

## 2019-07-27 DIAGNOSIS — G8929 Other chronic pain: Secondary | ICD-10-CM | POA: Diagnosis not present

## 2019-07-27 NOTE — Progress Notes (Signed)
The patient is only saw a year ago.  She has severe L4 and L5 spondylolisthesis and severe degenerative disc disease and osteophytes at multiple levels in the lumbar spine.  Most of her disease was to the right side and she was having radicular symptoms going down her right leg.  We actually sent her for a neurosurgical evaluation.  She is 85 years old.  According to the patient and her family member who is with her she is no surgical candidate but she did have 2 epidural steroid injections over there that helped greatly.  She would like to have those ordered again.  She thought we had ordered those even though his neurosurgeons did order those.  She is ambulate a cane.  She still has the same radicular symptoms going down her right leg.  She denies any other acute changes in her medical status.  On exam she has a positive straight leg raise to the right side.  She has some weakness in her right foot which is only mild.  She does have subjective numbness going down the right leg.  Her left leg exam is normal.  She has pain in the lumbar spine on both right and left sides.  Her mobility is limited.  She is ambling with a cane.  We will send her back to neurosurgery to likely Dr. Brien Few for right-sided ESI at L4-L5.  She can follow-up with them as well.  All question concerns were answered and addressed

## 2019-10-01 ENCOUNTER — Other Ambulatory Visit: Payer: Self-pay | Admitting: Family Medicine

## 2019-10-01 DIAGNOSIS — E1169 Type 2 diabetes mellitus with other specified complication: Secondary | ICD-10-CM

## 2019-10-05 NOTE — Telephone Encounter (Signed)
Please advise if a year supply is ok for pt to have . [place]

## 2019-10-13 DIAGNOSIS — E669 Obesity, unspecified: Secondary | ICD-10-CM | POA: Insufficient documentation

## 2019-10-13 DIAGNOSIS — Z6835 Body mass index (BMI) 35.0-35.9, adult: Secondary | ICD-10-CM | POA: Insufficient documentation

## 2019-10-13 DIAGNOSIS — M48061 Spinal stenosis, lumbar region without neurogenic claudication: Secondary | ICD-10-CM | POA: Insufficient documentation

## 2019-10-13 DIAGNOSIS — I10 Essential (primary) hypertension: Secondary | ICD-10-CM | POA: Diagnosis not present

## 2019-10-28 ENCOUNTER — Telehealth: Payer: Self-pay | Admitting: Family Medicine

## 2019-10-28 NOTE — Telephone Encounter (Signed)
Pt called and wanted to see if she would be eligible to take the Covid vaccine with all the medications that she takes. She wants to make sure that it wont interfere with nothing that she is taking

## 2019-10-28 NOTE — Telephone Encounter (Signed)
Tell her to get the shot

## 2019-10-29 NOTE — Telephone Encounter (Signed)
Pt was advised Phyllis Campbell 

## 2019-11-16 ENCOUNTER — Telehealth: Payer: Self-pay | Admitting: Family Medicine

## 2019-11-16 ENCOUNTER — Other Ambulatory Visit: Payer: Self-pay

## 2019-11-16 ENCOUNTER — Encounter: Payer: Self-pay | Admitting: Family Medicine

## 2019-11-16 ENCOUNTER — Ambulatory Visit: Payer: Medicare PPO | Admitting: Family Medicine

## 2019-11-16 VITALS — BP 138/80 | HR 66 | Temp 98.2°F | Wt 209.0 lb

## 2019-11-16 DIAGNOSIS — M5442 Lumbago with sciatica, left side: Secondary | ICD-10-CM | POA: Diagnosis not present

## 2019-11-16 DIAGNOSIS — I10 Essential (primary) hypertension: Secondary | ICD-10-CM | POA: Diagnosis not present

## 2019-11-16 DIAGNOSIS — E1159 Type 2 diabetes mellitus with other circulatory complications: Secondary | ICD-10-CM | POA: Diagnosis not present

## 2019-11-16 DIAGNOSIS — E1169 Type 2 diabetes mellitus with other specified complication: Secondary | ICD-10-CM

## 2019-11-16 DIAGNOSIS — M1990 Unspecified osteoarthritis, unspecified site: Secondary | ICD-10-CM | POA: Diagnosis not present

## 2019-11-16 DIAGNOSIS — G8929 Other chronic pain: Secondary | ICD-10-CM

## 2019-11-16 DIAGNOSIS — E118 Type 2 diabetes mellitus with unspecified complications: Secondary | ICD-10-CM

## 2019-11-16 DIAGNOSIS — M5441 Lumbago with sciatica, right side: Secondary | ICD-10-CM

## 2019-11-16 DIAGNOSIS — E785 Hyperlipidemia, unspecified: Secondary | ICD-10-CM | POA: Diagnosis not present

## 2019-11-16 LAB — POCT GLYCOSYLATED HEMOGLOBIN (HGB A1C): Hemoglobin A1C: 6.4 % — AB (ref 4.0–5.6)

## 2019-11-16 MED ORDER — ONETOUCH VERIO VI STRP: ORAL_STRIP | 3 refills | Status: DC

## 2019-11-16 NOTE — Addendum Note (Signed)
Addended by: Renelda Loma on: 11/16/2019 09:19 AM   Modules accepted: Orders

## 2019-11-16 NOTE — Progress Notes (Signed)
  Subjective:    Patient ID: Phyllis Campbell, female    DOB: [birth year], 86 y.o.   MRN: [id number]  Phyllis Campbell is a 86 y.o. female who presents for follow-up of Type 2 diabetes mellitus.   Home blood sugar records: Remain in the good range Current symptoms/problems include none and have been unchanged. Daily foot checks:   Any foot concerns: None. Exercise: Exercise is limited by orthopedic condition(s): Chronic low back pain.  She has had an epidural injection.  She states that she does walk for half an hour every diet: Regular  She continues on simvastatin and having no aches or pains.  She also is taking losartan/HCTZ without trouble.  Presently she is not on a diabetes medication.  She is trying to get on the schedule to get a Covid shot.  I am arthritis but is having difficulty with that other than low back pain. the following portions of the patient's history were reviewed and updated as appropriate: allergies, current medications, past medical history, past social history and problem list.  ROS as in subjective above.     Objective:    Physical Exam Alert and in no distress otherwise not examined.  Blood pressure 138/80, pulse 66, temperature 98.2 F (36.8 C), weight 209 lb (94.8 kg), SpO2 96 %.  Lab Review Diabetic Labs Latest Ref Rng & Units 07/13/2019 03/11/2019 02/23/2019 09/01/2018 04/08/2018  HbA1c 4.0 - 5.6 % 6.6(A) 6.3(A) - 6.4(A) 6.2(A)  Microalbumin mg/L - - - - -  Micro/Creat Ratio - - - - - -  Chol 100 - 199 mg/dL - - 628 - -  HDL >36 mg/dL - - 57 - -  Calc LDL 0 - 99 mg/dL - - 67 - -  Triglycerides 0 - 149 mg/dL - - 63 - -  Creatinine 0.57 - 1.00 mg/dL - - 6.29 - -   BP/Weight 11/16/2019 07/13/2019 06/01/2019 03/11/2019 01/28/2019  Systolic BP 138 126 - 134 -  Diastolic BP 80 72 - 82 -  Wt. (Lbs) 209 209.8 204 207.8 203  BMI 34.78 34.91 33.95 34.58 34.84   Foot/eye exam completion dates Latest Ref Rng & Units 03/11/2019 11/11/2017  Eye Exam No Retinopathy - -  Foot  Form Completion - Done Done  Hemoglobin A1c is 6.4 Phyllis Campbell  reports that she has never smoked. She has never used smokeless tobacco. She reports that she does not drink alcohol or use drugs.     Assessment & Plan:    Hypertension complicating diabetes (HCC)  Hyperlipidemia associated with type 2 diabetes mellitus (HCC)  DM (diabetes mellitus) with complications (HCC)  Chronic bilateral low back pain with bilateral sciatica  Arthritis  1. Rx changes: none 2. Education: Reviewed 'ABCs' of diabetes management (respective goals in parentheses):  A1C (<7), blood pressure (<130/80), and cholesterol (LDL <100). 3. Compliance at present is estimated to be excellent. Efforts to improve compliance (if necessary) will be directed at increased exercise.  She is scheduled for further epidural injection so hopefully that will help with her physical activity.  Follow up: 6 months

## 2019-11-16 NOTE — Telephone Encounter (Signed)
Pt was called and items sent in. [facility]

## 2019-11-16 NOTE — Telephone Encounter (Signed)
Patient left message she needs a blood glucose machine called in as they want give it to her.

## 2019-11-18 ENCOUNTER — Telehealth: Payer: Self-pay

## 2019-11-18 NOTE — Telephone Encounter (Signed)
I submitted a PA for the pts. One touch verio test strips and it was approved from 10/09/19-10/07/20.

## 2019-11-25 ENCOUNTER — Other Ambulatory Visit: Payer: Self-pay | Admitting: Family Medicine

## 2019-11-25 DIAGNOSIS — E119 Type 2 diabetes mellitus without complications: Secondary | ICD-10-CM

## 2019-11-25 DIAGNOSIS — E1159 Type 2 diabetes mellitus with other circulatory complications: Secondary | ICD-10-CM

## 2019-12-01 ENCOUNTER — Telehealth: Payer: Self-pay | Admitting: Family Medicine

## 2019-12-01 NOTE — Telephone Encounter (Signed)
Received requested records from [facility]

## 2019-12-06 ENCOUNTER — Ambulatory Visit: Payer: Medicare PPO | Attending: Internal Medicine

## 2019-12-06 DIAGNOSIS — Z23 Encounter for immunization: Secondary | ICD-10-CM | POA: Insufficient documentation

## 2019-12-06 NOTE — Progress Notes (Signed)
   Covid-19 Vaccination Clinic  Name:  Phyllis Campbell    MRN: [id number] DOB: [birth year]  12/06/2019  Phyllis Campbell was observed post Covid-19 immunization for 30 minutes based on pre-vaccination screening without incidence. She was provided with Vaccine Information Sheet and instruction to access the V-Safe system.   Phyllis Campbell was instructed to call 911 with any severe reactions post vaccine: Marland Kitchen Difficulty breathing  . Swelling of your face and throat  . A fast heartbeat  . A bad rash all over your body  . Dizziness and weakness    Immunizations Administered    Name Date Dose VIS Date Route   Pfizer COVID-19 Vaccine 12/06/2019 11:44 AM 0.3 mL 09/18/2019 Intramuscular   Manufacturer: [employer], [employer]   Lot: [id number]   NDC: [address]-3

## 2019-12-08 ENCOUNTER — Telehealth: Payer: Self-pay

## 2019-12-08 ENCOUNTER — Other Ambulatory Visit: Payer: Self-pay

## 2019-12-08 DIAGNOSIS — E1159 Type 2 diabetes mellitus with other circulatory complications: Secondary | ICD-10-CM

## 2019-12-08 DIAGNOSIS — E785 Hyperlipidemia, unspecified: Secondary | ICD-10-CM

## 2019-12-08 DIAGNOSIS — E1169 Type 2 diabetes mellitus with other specified complication: Secondary | ICD-10-CM

## 2019-12-08 MED ORDER — LOSARTAN POTASSIUM-HCTZ 50-12.5 MG PO TABS: 1.0000 | ORAL_TABLET | Freq: Every day | ORAL | 3 refills | Status: DC

## 2019-12-08 MED ORDER — SIMVASTATIN 40 MG PO TABS: 40.0000 mg | ORAL_TABLET | Freq: Every day | ORAL | 3 refills | Status: DC

## 2019-12-08 MED ORDER — RESTASIS 0.05 % OP EMUL: 1.0000 [drp] | OPHTHALMIC | 1 refills | Status: DC | PRN

## 2019-12-08 NOTE — Telephone Encounter (Signed)
Done KH 

## 2019-12-08 NOTE — Telephone Encounter (Signed)
Received fax from [facility] pharmacy pt. Needs refill's on her Losartan/hctz, simvastatin, and restasis, pt. Last apt was 11/16/19

## 2019-12-08 NOTE — Telephone Encounter (Signed)
Done and pt was advised kh

## 2019-12-16 ENCOUNTER — Other Ambulatory Visit: Payer: Self-pay | Admitting: Family Medicine

## 2019-12-16 DIAGNOSIS — E1159 Type 2 diabetes mellitus with other circulatory complications: Secondary | ICD-10-CM

## 2019-12-16 DIAGNOSIS — E785 Hyperlipidemia, unspecified: Secondary | ICD-10-CM

## 2019-12-16 DIAGNOSIS — E1169 Type 2 diabetes mellitus with other specified complication: Secondary | ICD-10-CM

## 2019-12-16 MED ORDER — SIMVASTATIN 40 MG PO TABS: 40.0000 mg | ORAL_TABLET | Freq: Every day | ORAL | 3 refills | Status: DC

## 2019-12-16 MED ORDER — LOSARTAN POTASSIUM-HCTZ 50-12.5 MG PO TABS: 1.0000 | ORAL_TABLET | Freq: Every day | ORAL | 3 refills | Status: DC

## 2019-12-17 ENCOUNTER — Telehealth: Payer: Self-pay

## 2019-12-17 DIAGNOSIS — M5416 Radiculopathy, lumbar region: Secondary | ICD-10-CM | POA: Diagnosis not present

## 2019-12-17 NOTE — Telephone Encounter (Signed)
A pharmacists from [facility] called wanting to verify the directions and quantity on the pts. restasis they said usually it is written for twice a day and the quantity needs to be for a 90 day supply the quantity they received was for only one vial which would only last one day. 1.51ml comes in one vial.

## 2019-12-17 NOTE — Telephone Encounter (Signed)
I did not fill out the prescription. this comes from her ophthalmologist

## 2019-12-18 NOTE — Telephone Encounter (Signed)
Called pharmacy and provided info to eye doctors office to have pt restasis. KH

## 2019-12-21 ENCOUNTER — Telehealth: Payer: Self-pay | Admitting: Family Medicine

## 2019-12-21 DIAGNOSIS — E1169 Type 2 diabetes mellitus with other specified complication: Secondary | ICD-10-CM

## 2019-12-21 DIAGNOSIS — E785 Hyperlipidemia, unspecified: Secondary | ICD-10-CM

## 2019-12-21 MED ORDER — SIMVASTATIN 40 MG PO TABS: 40.0000 mg | ORAL_TABLET | Freq: Every day | ORAL | 0 refills | Status: DC

## 2019-12-21 NOTE — Telephone Encounter (Signed)
Pt called and said she needs a partial refill on Simvastatin sent to [facility] [address]. Because her insurance changed and she has mail in order but she is completely out of this medication

## 2020-01-05 ENCOUNTER — Ambulatory Visit: Payer: Medicare PPO | Attending: Internal Medicine

## 2020-01-05 DIAGNOSIS — Z23 Encounter for immunization: Secondary | ICD-10-CM

## 2020-01-05 NOTE — Progress Notes (Signed)
   Covid-19 Vaccination Clinic  Name:  Phyllis Campbell    MRN: [id number] DOB: 1931-11-17  01/05/2020  [name] was observed post Covid-19 immunization for 15 minutes without incident. She was provided with Vaccine Information Sheet and instruction to access the V-Safe system.   [name] was instructed to call 911 with any severe reactions post vaccine: Marland Kitchen Difficulty breathing  . Swelling of face and throat  . A fast heartbeat  . A bad rash all over body  . Dizziness and weakness   Immunizations Administered    Name Date Dose VIS Date Route   Pfizer COVID-19 Vaccine 01/05/2020 12:54 PM 0.3 mL 09/18/2019 Intramuscular   Manufacturer: [employer], [employer]   Lot: [id number]   NDC: [address]-0

## 2020-01-14 DIAGNOSIS — M48061 Spinal stenosis, lumbar region without neurogenic claudication: Secondary | ICD-10-CM | POA: Diagnosis not present

## 2020-01-14 DIAGNOSIS — Z6835 Body mass index (BMI) 35.0-35.9, adult: Secondary | ICD-10-CM | POA: Diagnosis not present

## 2020-01-14 DIAGNOSIS — M5416 Radiculopathy, lumbar region: Secondary | ICD-10-CM | POA: Diagnosis not present

## 2020-01-14 DIAGNOSIS — I10 Essential (primary) hypertension: Secondary | ICD-10-CM | POA: Diagnosis not present

## 2020-01-22 ENCOUNTER — Ambulatory Visit: Payer: Medicare PPO | Admitting: Family Medicine

## 2020-01-22 ENCOUNTER — Other Ambulatory Visit: Payer: Self-pay

## 2020-01-22 ENCOUNTER — Ambulatory Visit
Admission: RE | Admit: 2020-01-22 | Discharge: 2020-01-22 | Disposition: A | Discharge status: Home / Self Care | Payer: Medicare PPO | Source: Ambulatory Visit | Attending: Family Medicine | Admitting: Family Medicine

## 2020-01-22 ENCOUNTER — Encounter: Payer: Self-pay | Admitting: Family Medicine

## 2020-01-22 VITALS — BP 124/68 | HR 75 | Temp 96.9°F | Wt 202.0 lb

## 2020-01-22 DIAGNOSIS — M25559 Pain in unspecified hip: Secondary | ICD-10-CM

## 2020-01-22 DIAGNOSIS — M25552 Pain in left hip: Secondary | ICD-10-CM | POA: Diagnosis not present

## 2020-01-22 NOTE — Progress Notes (Signed)
Left hip  Subjective:    Patient ID: Phyllis Campbell, female    DOB: 11-12-1931, 86 y.o.   MRN: [id number]  HPI She complains of a 1 week history of left hip pain.  No history of injury to it when she stands up the pain gets much worse.  She has been taking Tylenol arthritis without much relief.   Review of Systems     Objective:   Physical Exam  Alert and complaining of left hip pain.  Questionable tenderness palpation in the left hip area but not necessarily over the greater trochanter.  Discomfort with flexion and internal and external rotation was uncomfortable.      Assessment & Plan:  Hip pain - Plan: DG Hip Unilat W OR W/O Pelvis 1V Left I will get an x-ray and then also recommend 2 Aleve twice per day pending results of x-ray.

## 2020-02-03 ENCOUNTER — Other Ambulatory Visit: Payer: Self-pay | Admitting: *Deleted

## 2020-02-03 ENCOUNTER — Telehealth: Payer: Self-pay | Admitting: Family Medicine

## 2020-02-03 MED ORDER — ONETOUCH VERIO VI STRP: ORAL_STRIP | 3 refills | Status: DC

## 2020-02-03 NOTE — Telephone Encounter (Signed)
Pharmacy called back and states that insurance would not cover  onetouch verio pt needs new meter, test strips and lancets and they need to be ACCU check please [facility] to the  [id number] [place], [place] - [address] Phone:  [phone]  Fax:  [phone]

## 2020-02-04 ENCOUNTER — Other Ambulatory Visit: Payer: Self-pay

## 2020-02-04 MED ORDER — ACCU-CHEK GUIDE ME W/DEVICE KIT: 1.0000 | PACK | Freq: Once | 0 refills | Status: AC

## 2020-02-04 MED ORDER — ACCU-CHEK SOFTCLIX LANCETS MISC: 1.0000 | Freq: Every day | 3 refills | Status: DC

## 2020-02-04 MED ORDER — GLUCOSE BLOOD VI STRP: 1.0000 | ORAL_STRIP | 3 refills | Status: DC | PRN

## 2020-02-04 NOTE — Telephone Encounter (Signed)
Called pt to advise script was sent in to [facility] requested. No answer and couldn't leave message. KH

## 2020-02-04 NOTE — Telephone Encounter (Signed)
Pt was left a voice mail . KH

## 2020-02-05 NOTE — Telephone Encounter (Signed)
Pt was advised Kh 

## 2020-02-26 IMAGING — CR DG LUMBAR SPINE COMPLETE 4+V
5 series · 5 of 5 positions shown · non-contrast
Comparison: 03/25/2009.

CLINICAL DATA: Chronic low back pain radiating to the right leg. No
injury.

EXAM:
LUMBAR SPINE - COMPLETE 4+ VIEW

[t l-spine a.p.]
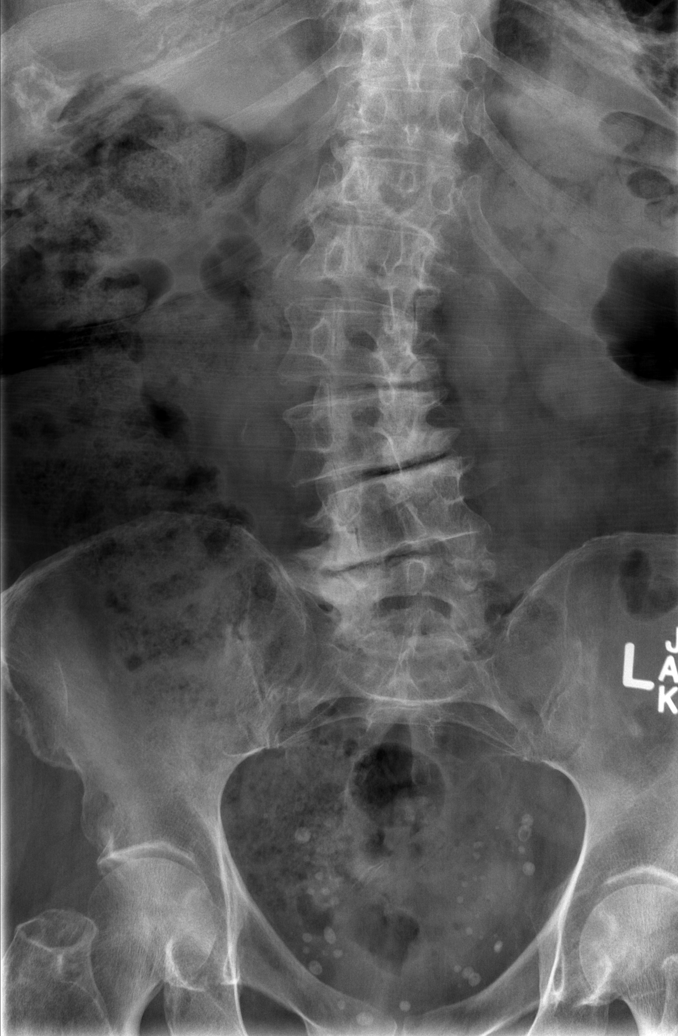

[t l-spine oblique exposure (1 of 2)]
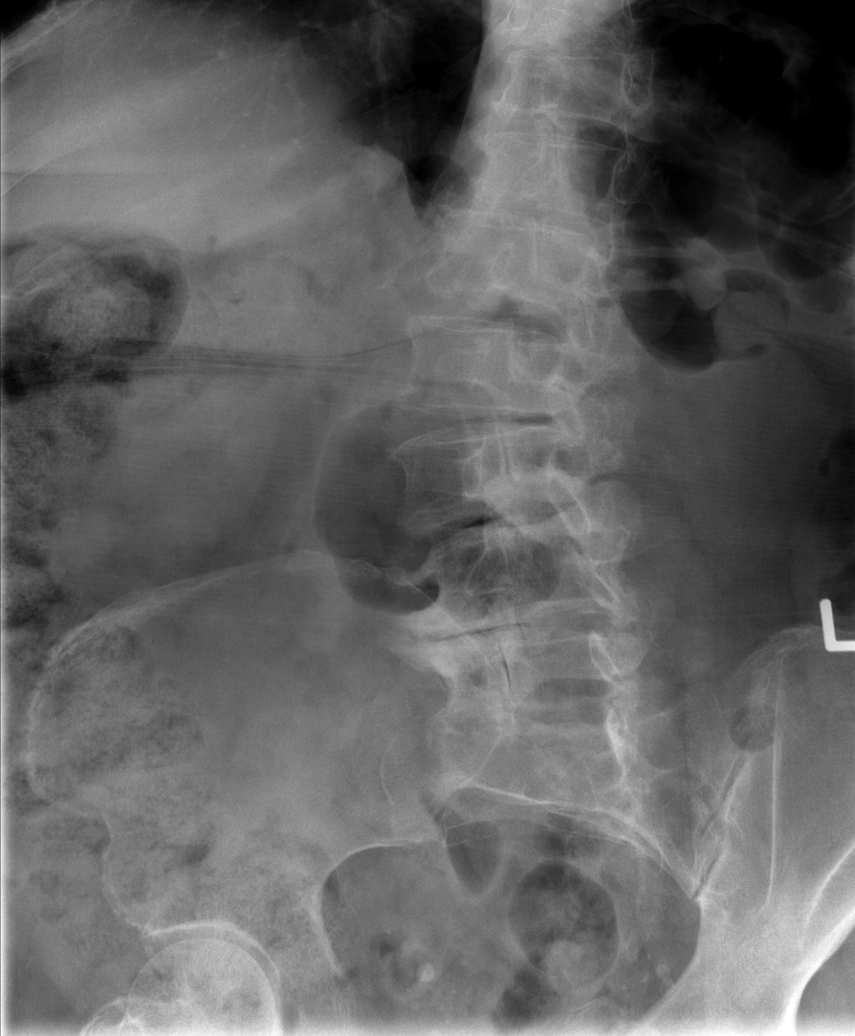

[t l-spine oblique exposure (2 of 2)]
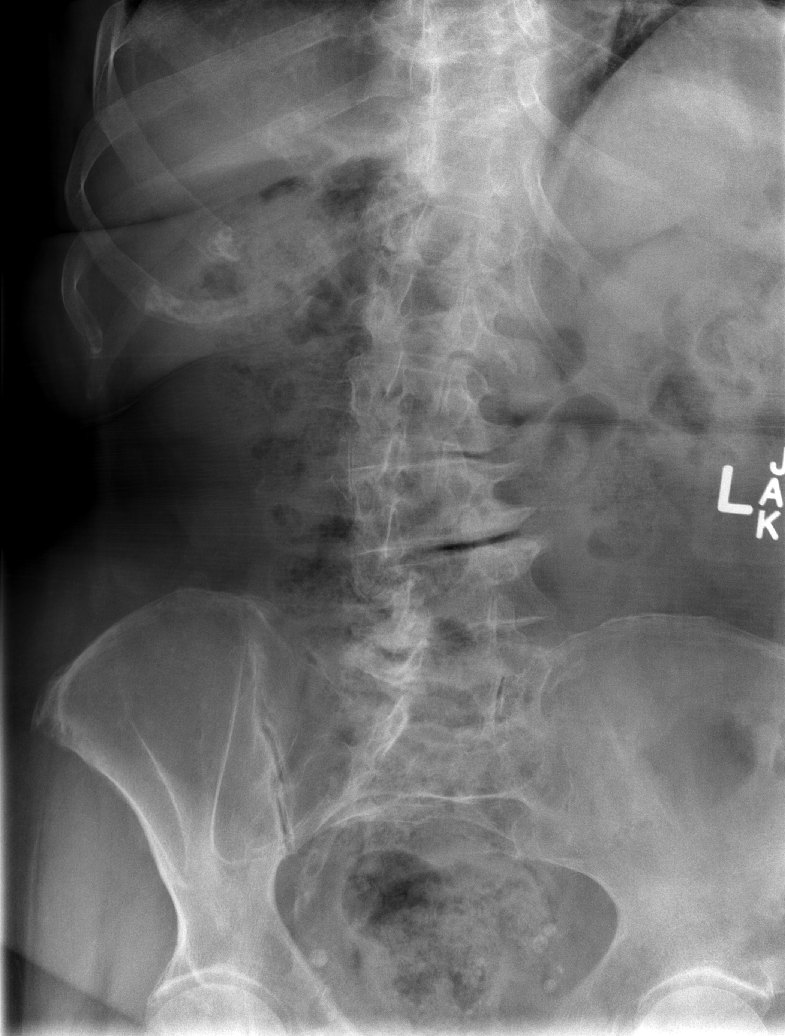

[t l-spine lat]
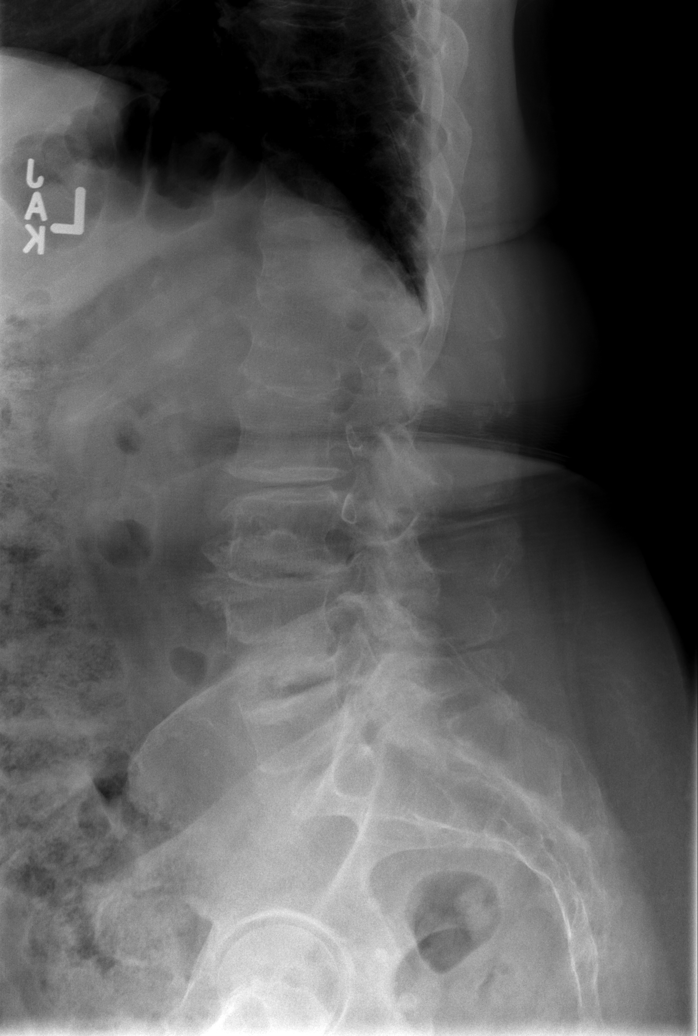

[t l-spine l5-s1 spot]
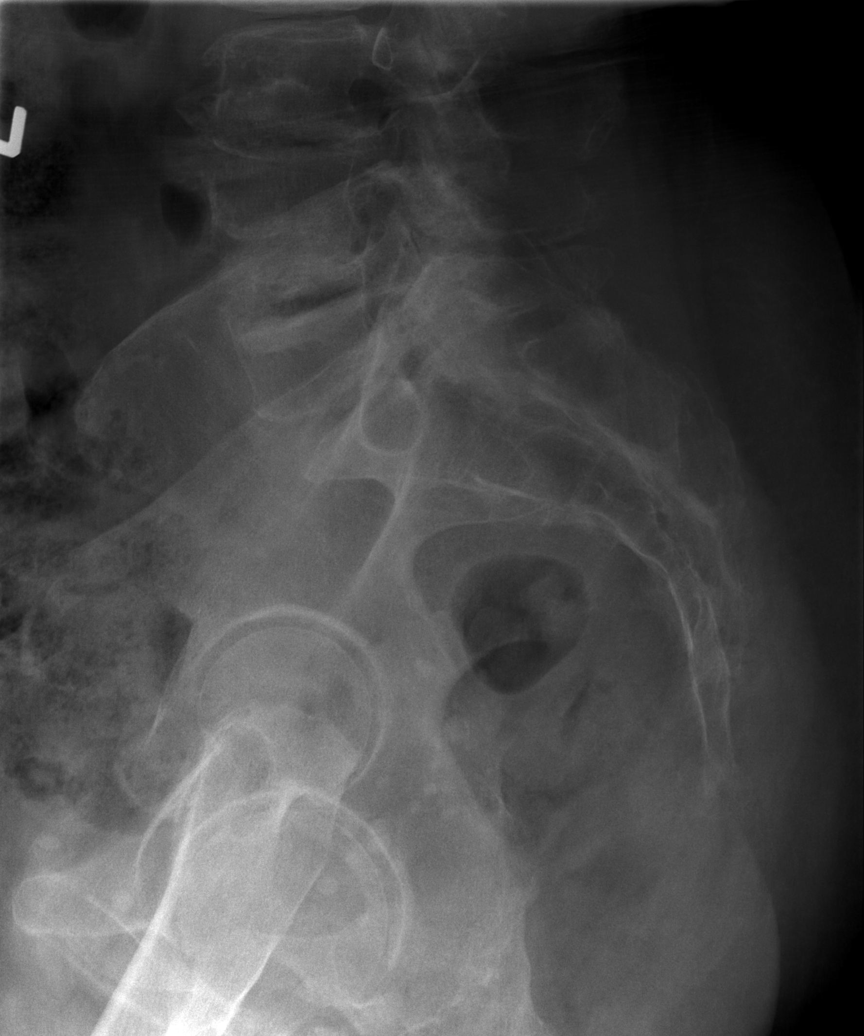

[5 of 5 positions shown; findings below may reference images not displayed]

FINDINGS: No fracture, bone lesion or spondylolisthesis.

Mild loss of disc height at L1-L2 and L2-L3. Moderate loss of disc
height at L3-L4-L4-L5. L5-S1 disc is relatively well preserved in
height.

Endplate osteophytes noted at all lumbar levels. Endplate sclerosis
evident at L3-L4-L4-L5.

There are facet degenerative changes bilaterally most evident on the
right at L3-L4 through L5-S1 and on the left at L2-L3, L3-L4 and
L4-L5.

Moderate dextroscoliosis, apex at L2-L3.

Soft tissues are unremarkable.
IMPRESSION: 1. No fracture or acute finding.
2. Degenerative changes and dextroscoliosis. Degenerative changes
have increased when compared to the prior radiographs, most evident
at L4-L5.

## 2020-03-11 ENCOUNTER — Other Ambulatory Visit: Payer: Self-pay | Admitting: *Deleted

## 2020-03-11 DIAGNOSIS — I70213 Atherosclerosis of native arteries of extremities with intermittent claudication, bilateral legs: Secondary | ICD-10-CM

## 2020-03-15 ENCOUNTER — Other Ambulatory Visit: Payer: Self-pay

## 2020-03-15 ENCOUNTER — Ambulatory Visit (INDEPENDENT_AMBULATORY_CARE_PROVIDER_SITE_OTHER): Payer: Medicare PPO | Admitting: Physician Assistant

## 2020-03-15 ENCOUNTER — Ambulatory Visit (HOSPITAL_COMMUNITY)
Admission: RE | Admit: 2020-03-15 | Discharge: 2020-03-15 | Disposition: A | Discharge status: Home / Self Care | Payer: Medicare PPO | Source: Ambulatory Visit | Attending: Vascular Surgery | Admitting: Vascular Surgery

## 2020-03-15 VITALS — BP 122/71 | HR 68 | Temp 97.0°F | Resp 18 | Ht 64.0 in | Wt 202.8 lb

## 2020-03-15 DIAGNOSIS — I70213 Atherosclerosis of native arteries of extremities with intermittent claudication, bilateral legs: Secondary | ICD-10-CM | POA: Diagnosis not present

## 2020-03-15 DIAGNOSIS — I739 Peripheral vascular disease, unspecified: Secondary | ICD-10-CM

## 2020-03-15 NOTE — Progress Notes (Signed)
HISTORY AND PHYSICAL     CC:  follow up. Requesting Provider:  Ronnald Nian, MD  HPI: This is a 86 y.o. female who is here today for follow up for numbness in her feet and mild to moderate PAD.  Pt was last seen 08/25/2018 by NP and at that time, she was found to have normal ABI.  The onset of her sx were in 2017 without an obvious trigger.  She did not have any claudication sx and her numbness was intermittent.  She does have hx of chronic low back pain.  She has hx of arthriits and scoliosis and was receiving injections to low back for pain in low back and legs.   She was referred to [facility] for evaluation of occasional painful area on the left foot and DM exams.  She was scheduled for an 18 month follow up with [facility].  The pt returns today for routine follow-up. Her primary complaint if left hip pain and difficulty raising her left leg.  She uses a cane for balance. She denies LE cramping or rest pain.  No history of stroke/TIA and denies sudden extremity weakness, monocular blindness or slurred speech.  She shows me a lump on her left upper arm and right lateral thigh she noticed about 6 months ago.  The pt is on a statin for cholesterol management.    The pt is on an aspirin.    Other AC:  none The pt is on ARB for hypertension.  The pt does have diabetes. Tobacco hx:  never   Past Medical History:  Diagnosis Date  . Allergy   . Arthritis   . Cataracts, bilateral   . Diabetes mellitus   . DVT (deep venous thrombosis) (HCC)   . Dyslipidemia   . HH (hiatus hernia)   . Hypertension   . Obesity   . Postmenopausal   . Stroke (HCC)   . Thyromegaly     Past Surgical History:  Procedure Laterality Date  . ABDOMINAL HYSTERECTOMY    . cataracts    . CESAREAN SECTION    . COLONOSCOPY  2004  . EYE SURGERY    . SHOULDER SURGERY      No Known Allergies  Current Outpatient Medications  Medication Sig Dispense Refill  . Accu-Chek Softclix Lancets lancets  1 each by Other route daily. Use as instructed 100 each 3  . aspirin EC 81 MG tablet Take 81 mg by mouth daily.    . Chlorpheniramine-DM (CORICIDIN HBP COUGH/COLD PO) Take 2 tablets by mouth as needed.    . Ginkgo Biloba 40 MG TABS Take 60 mg by mouth as needed. Reported on 02/08/2016    . glucose blood test strip 1 each by Other route as needed. Use as instructed 100 each 3  . ibuprofen (ADVIL,MOTRIN) 200 MG tablet Take 200 mg by mouth every 6 (six) hours as needed. Reported on 02/08/2016    . Liniments (SALONPAS ARTHRITIS PAIN RELIEF EX) Apply topically.    Marland Kitchen losartan-hydrochlorothiazide (HYZAAR) 50-12.5 MG tablet Take 1 tablet by mouth daily. 90 tablet 3  . meclizine (ANTIVERT) 12.5 MG tablet TAKE 1 TABLET BY MOUTH THREE TIMES DAILY AS NEEDED FOR  DIZZINESS 30 tablet 0  . methocarbamol (ROBAXIN) 500 MG tablet Take 1 tablet (500 mg total) by mouth at bedtime as needed for muscle spasms. 30 tablet 1  . Pseudoephedrine-Ibuprofen (ADVIL COLD/SINUS PO) Take 2 tablets by mouth as needed.    . RESTASIS 0.05 %  ophthalmic emulsion Place 1 drop into both eyes as needed. 1.5 mL 1  . simvastatin (ZOCOR) 40 MG tablet Take 1 tablet (40 mg total) by mouth daily. 30 tablet 0   No current facility-administered medications for this visit.    No family history on file.  Social History   Socioeconomic History  . Marital status: Married    Spouse name: Not on file  . Number of children: Not on file  . Years of education: Not on file  . Highest education level: Not on file  Occupational History  . Not on file  Tobacco Use  . Smoking status: Never Smoker  . Smokeless tobacco: Never Used  Substance and Sexual Activity  . Alcohol use: No  . Drug use: No  . Sexual activity: Not Currently  Other Topics Concern  . Not on file  Social History Narrative  . Not on file   Social Determinants of Health   Financial Resource Strain:   . Difficulty of Paying Living Expenses:   Food Insecurity:   . Worried  About Charity fundraiser in the Last Year:   . Arboriculturist in the Last Year:   Transportation Needs:   . Film/video editor (Medical):   Marland Kitchen Lack of Transportation (Non-Medical):   Physical Activity:   . Days of Exercise per Week:   . Minutes of Exercise per Session:   Stress:   . Feeling of Stress :   Social Connections:   . Frequency of Communication with Friends and Family:   . Frequency of Social Gatherings with Friends and Family:   . Attends Religious Services:   . Active Member of Clubs or Organizations:   . Attends Archivist Meetings:   Marland Kitchen Marital Status:   Intimate Partner Violence:   . Fear of Current or Ex-Partner:   . Emotionally Abused:   Marland Kitchen Physically Abused:   . Sexually Abused:      REVIEW OF SYSTEMS:   [X]  denotes positive finding, [ ]  denotes negative finding Cardiac  Comments:  Chest pain or chest pressure:    Shortness of breath upon exertion:    Short of breath when lying flat:    Irregular heart rhythm:        Vascular    Pain in calf, thigh, or hip brought on by ambulation:    Pain in feet at night that wakes you up from your sleep:     Blood clot in your veins:    Leg swelling:         Pulmonary    Oxygen at home:    Productive cough:     Wheezing:         Neurologic    Sudden weakness in arms or legs:     Sudden numbness in arms or legs:     Sudden onset of difficulty speaking or slurred speech:    Temporary loss of vision in one eye:     Problems with dizziness:         Gastrointestinal    Blood in stool:     Vomited blood:         Genitourinary    Burning when urinating:     Blood in urine:        Psychiatric    Major depression:         Hematologic    Bleeding problems:    Problems with blood clotting too easily:  Skin    Rashes or ulcers:        Constitutional    Fever or chills:      PHYSICAL EXAMINATION:  Vitals:   03/15/20 1046  BP: 122/71  Pulse: 68  Resp: 18  Temp: (!) 97 F (36.1  C)  SpO2: 98%   General:  WDWN in NAD; vital signs documented above Gait: steady with cane HENT: WNL, normocephalic Pulmonary: normal non-labored breathing , without wheezing Cardiac: regular HR, without  Murmur; without carotid bruits Abdomen: soft, NT, no masses Skin: without rashes Pulse exam: 2+ palpable brachial, radial, femoral pulses bilaterally. 2+ left DP pulse. Non-palpable popliteal, right pedal pulses. Extremities: without ischemic changes, without Gangrene , without cellulitis; without open wounds; Her feet are warm and well perfused. Approximately 4 X 4 cm soft tissue mass of left upper arm and slightly smaller ST mass of lateral right thigh. Neither are fixed or tender to palpation  Musculoskeletal: no muscle wasting or atrophy  Neurologic: A&O X 3;  No focal weakness or paresthesias are detected Psychiatric:  The pt has Normal affect.   Non-Invasive Vascular Imaging:   ABI's/TBI's on 03/15/2020: Right: 1.06 - Great toe pressure: 57 Left:  1.09 - Great toe pressure:  74 Biphasic waveforms bilaterally  Previous ABI's/TBI's on 08/25/2018: Right:  1.19/0.56 - Great toe pressure: 81 Left:  1.04/0.63 - Great toe pressure: 90  ASSESSMENT/PLAN:: 86 y.o. female here for follow up for follow up for minimal RLE PAD and mild to moderate LLE PAD. She has no claudication symptoms or non-healing wounds. Normal ABIs. Follow-up in one year with repeat ABIs or sooner if she develops LE pain or non-healing wounds.  Soft tissue mass of left upper arm and right thigh most c/w lipomas. Defer to PCP for further investigation/monitoring.  Milinda Antis, PA-C [facility] [phone]  Clinic MD:   Chest[facility] Spore

## 2020-03-21 DIAGNOSIS — M5416 Radiculopathy, lumbar region: Secondary | ICD-10-CM | POA: Diagnosis not present

## 2020-04-08 ENCOUNTER — Telehealth: Payer: Self-pay | Admitting: Family Medicine

## 2020-04-08 NOTE — Telephone Encounter (Signed)
Pt called & states just a little over an hour ago she developed sharp pain in stomach, no other symptoms.  She took some black seed oil, no relief.  Discussed with Vickie, advised take Tylenol and if develops, fever, chills, increased pain, bloody diarrhea, vomiting or worsening symptoms go to ED.

## 2020-04-22 DIAGNOSIS — M5416 Radiculopathy, lumbar region: Secondary | ICD-10-CM | POA: Diagnosis not present

## 2020-04-22 DIAGNOSIS — I10 Essential (primary) hypertension: Secondary | ICD-10-CM | POA: Diagnosis not present

## 2020-04-22 DIAGNOSIS — Z6825 Body mass index (BMI) 25.0-25.9, adult: Secondary | ICD-10-CM | POA: Diagnosis not present

## 2020-04-22 DIAGNOSIS — M48061 Spinal stenosis, lumbar region without neurogenic claudication: Secondary | ICD-10-CM | POA: Diagnosis not present

## 2020-05-05 DIAGNOSIS — H18413 Arcus senilis, bilateral: Secondary | ICD-10-CM | POA: Diagnosis not present

## 2020-05-05 DIAGNOSIS — H16223 Keratoconjunctivitis sicca, not specified as Sjogren's, bilateral: Secondary | ICD-10-CM | POA: Diagnosis not present

## 2020-05-05 DIAGNOSIS — Z961 Presence of intraocular lens: Secondary | ICD-10-CM | POA: Diagnosis not present

## 2020-05-05 DIAGNOSIS — H43813 Vitreous degeneration, bilateral: Secondary | ICD-10-CM | POA: Diagnosis not present

## 2020-05-17 ENCOUNTER — Ambulatory Visit: Payer: Medicare PPO | Admitting: Family Medicine

## 2020-05-17 ENCOUNTER — Encounter: Payer: Self-pay | Admitting: Family Medicine

## 2020-05-17 ENCOUNTER — Other Ambulatory Visit: Payer: Self-pay

## 2020-05-17 VITALS — BP 134/78 | HR 71 | Temp 96.9°F | Wt 203.2 lb

## 2020-05-17 DIAGNOSIS — E669 Obesity, unspecified: Secondary | ICD-10-CM

## 2020-05-17 DIAGNOSIS — E118 Type 2 diabetes mellitus with unspecified complications: Secondary | ICD-10-CM | POA: Diagnosis not present

## 2020-05-17 DIAGNOSIS — E785 Hyperlipidemia, unspecified: Secondary | ICD-10-CM | POA: Diagnosis not present

## 2020-05-17 DIAGNOSIS — I10 Essential (primary) hypertension: Secondary | ICD-10-CM

## 2020-05-17 DIAGNOSIS — J301 Allergic rhinitis due to pollen: Secondary | ICD-10-CM | POA: Diagnosis not present

## 2020-05-17 DIAGNOSIS — E1169 Type 2 diabetes mellitus with other specified complication: Secondary | ICD-10-CM | POA: Diagnosis not present

## 2020-05-17 DIAGNOSIS — M8580 Other specified disorders of bone density and structure, unspecified site: Secondary | ICD-10-CM | POA: Diagnosis not present

## 2020-05-17 DIAGNOSIS — E1159 Type 2 diabetes mellitus with other circulatory complications: Secondary | ICD-10-CM

## 2020-05-17 DIAGNOSIS — K219 Gastro-esophageal reflux disease without esophagitis: Secondary | ICD-10-CM | POA: Diagnosis not present

## 2020-05-17 DIAGNOSIS — I152 Hypertension secondary to endocrine disorders: Secondary | ICD-10-CM

## 2020-05-17 LAB — POCT UA - MICROALBUMIN
Albumin/Creatinine Ratio, Urine, POC: 14
Creatinine, POC: 104.9 mg/dL
Microalbumin Ur, POC: 14.7 mg/L

## 2020-05-17 LAB — POCT GLYCOSYLATED HEMOGLOBIN (HGB A1C): Hemoglobin A1C: 6.5 % — AB (ref 4.0–5.6)

## 2020-05-17 NOTE — Progress Notes (Signed)
Subjective:    Patient ID: Phyllis Campbell, female    DOB: 1932-04-11, 86 y.o.   MRN: [id number]  Phyllis Campbell is a 86 y.o. female who presents for follow-up of Type 2 diabetes mellitus.  Home blood sugar records: meter records, only when needed , 70 - 135 Current symptoms/problems include none at this time. Daily foot chcks: yes   Any foot concerns: great toe on right foot pain Exercise: water work out one hour Diet: fair She continues on simvastatin as well as losartan/HCTZ and doing well on these medications.  She does use Pepcid to help with her reflux symptoms.  Her allergies seem to be under good control.  She continues to be followed with periodic epidural injections to help with her back pain.  She states that it is quite helpful.  She does complain of intermittent difficulty with right great toe discomfort.  She describes a numb feeling to the medial aspect of her great toe. The following portions of the patient's history were reviewed and updated as appropriate: allergies, current medications, past medical history, past social history and problem list.  ROS as in subjective above.     Objective:    Physical Exam Alert and in no distress .  Exam of her feet shows normal skin.  Slight lateral deviation of the first MTP is noted.  Pulses difficult to feel.  Recent ABI was negative.  Hemoglobin A1c is 6.5   Lab Review Diabetic Labs Latest Ref Rng & Units 11/16/2019 07/13/2019 03/11/2019 02/23/2019 09/01/2018  HbA1c 4.0 - 5.6 % 6.4(A) 6.6(A) 6.3(A) - 6.4(A)  Microalbumin mg/L - - - - -  Micro/Creat Ratio - - - - - -  Chol 100 - 199 mg/dL - - - 678 -  HDL >93 mg/dL - - - 57 -  Calc LDL 0 - 99 mg/dL - - - 67 -  Triglycerides 0 - 149 mg/dL - - - 63 -  Creatinine 0.57 - 1.00 mg/dL - - - 8.10 -   BP/Weight 05/17/2020 03/15/2020 01/22/2020 11/16/2019 07/13/2019  Systolic BP 134 122 124 138 126  Diastolic BP 78 71 68 80 72  Wt. (Lbs) 203.2 202.8 202 209 209.8  BMI 34.88 34.81 33.61 34.78  34.91   Foot/eye exam completion dates Latest Ref Rng & Units 05/05/2019 03/11/2019  Eye Exam No Retinopathy No Retinopathy -  Foot Form Completion - - Done    Phyllis Campbell  reports that she has never smoked. She has never used smokeless tobacco. She reports that she does not drink alcohol and does not use drugs.     Assessment & Plan:    DM (diabetes mellitus) with complications (HCC) - Plan: POCT glycosylated hemoglobin (Hb A1C), POCT UA - Microalbumin, Ambulatory referral to Podiatry  Hyperlipidemia associated with type 2 diabetes mellitus (HCC)  Hypertension complicating diabetes (HCC)  Obesity (BMI 35.0-39.9 without comorbidity)  Non-seasonal allergic rhinitis due to pollen  Osteopenia, unspecified location - Plan: DG Bone Density  Gastroesophageal reflux disease without esophagitis   1. Rx changes: none 2. Education: Reviewed 'ABCs' of diabetes management (respective goals in parentheses):  A1C (<7), blood pressure (<130/80), and cholesterol (LDL <100). 3. Compliance at present is estimated to be good. Efforts to improve compliance (if necessary) will be directed at increased exercise. 4. Follow up: 4 months Overall she seems to be doing well.  I will refer her back to podiatry to get their input into her toe discomfort.  We will also order a bone density  scan.  Otherwise she will continue on her present medications.

## 2020-05-25 ENCOUNTER — Other Ambulatory Visit: Payer: Self-pay | Admitting: Family Medicine

## 2020-05-25 DIAGNOSIS — M8580 Other specified disorders of bone density and structure, unspecified site: Secondary | ICD-10-CM

## 2020-06-23 DIAGNOSIS — M5416 Radiculopathy, lumbar region: Secondary | ICD-10-CM | POA: Diagnosis not present

## 2020-07-12 ENCOUNTER — Ambulatory Visit: Payer: Medicare PPO | Attending: Internal Medicine

## 2020-07-12 DIAGNOSIS — Z23 Encounter for immunization: Secondary | ICD-10-CM

## 2020-07-12 NOTE — Progress Notes (Signed)
   Covid-19 Vaccination Clinic  Name:  Phyllis Campbell    MRN: [id number] DOB: Jun 17, 1932  07/12/2020  Phyllis Campbell was observed post Covid-19 immunization for 15 minutes without incident. She was provided with Vaccine Information Sheet and instruction to access the V-Safe system.   Phyllis Campbell was instructed to call 911 with any severe reactions post vaccine: Marland Kitchen Difficulty breathing  . Swelling of face and throat  . A fast heartbeat  . A bad rash all over body  . Dizziness and weakness

## 2020-07-25 DIAGNOSIS — M5416 Radiculopathy, lumbar region: Secondary | ICD-10-CM | POA: Diagnosis not present

## 2020-07-25 DIAGNOSIS — M48061 Spinal stenosis, lumbar region without neurogenic claudication: Secondary | ICD-10-CM | POA: Diagnosis not present

## 2020-08-16 ENCOUNTER — Encounter: Payer: Self-pay | Admitting: Family Medicine

## 2020-08-16 ENCOUNTER — Telehealth: Payer: Medicare PPO | Admitting: Family Medicine

## 2020-08-16 ENCOUNTER — Ambulatory Visit (INDEPENDENT_AMBULATORY_CARE_PROVIDER_SITE_OTHER): Payer: Medicare PPO | Admitting: Family Medicine

## 2020-08-16 VITALS — Temp 96.7°F | Wt 203.0 lb

## 2020-08-16 VITALS — BP 130/88 | HR 73 | Temp 97.9°F | Wt 201.8 lb

## 2020-08-16 DIAGNOSIS — N3001 Acute cystitis with hematuria: Secondary | ICD-10-CM | POA: Diagnosis not present

## 2020-08-16 DIAGNOSIS — R399 Unspecified symptoms and signs involving the genitourinary system: Secondary | ICD-10-CM

## 2020-08-16 LAB — POCT URINALYSIS DIP (PROADVANTAGE DEVICE)
Bilirubin, UA: NEGATIVE
Glucose, UA: NEGATIVE mg/dL
Ketones, POC UA: NEGATIVE mg/dL
Nitrite, UA: NEGATIVE
Protein Ur, POC: 30 mg/dL — AB
Specific Gravity, Urine: 1.025
Urobilinogen, Ur: 0.2
pH, UA: 6 (ref 5.0–8.0)

## 2020-08-16 MED ORDER — SULFAMETHOXAZOLE-TRIMETHOPRIM 800-160 MG PO TABS: 1.0000 | ORAL_TABLET | Freq: Two times a day (BID) | ORAL | 0 refills | Status: DC

## 2020-08-16 NOTE — Progress Notes (Signed)
   Subjective:    Patient ID: Phyllis Campbell, female    DOB: 03/31/1932, 86 y.o.   MRN: [id number]  HPI She complains of a 1 day history of foul-smelling urine and dysuria.  She did have a sore throat and headache last Wednesday however it went away very quickly.  No present difficulty with fever, chills or abdominal pain.  She has had her Pfizer booster   Review of Systems     Objective:   Physical Exam Alert and in no distress otherwise not examined.  Urine microscopic is loaded with red cells, bacteria and white cells.       Assessment & Plan:  Acute cystitis with hematuria - Plan: sulfamethoxazole-trimethoprim (BACTRIM DS) 800-160 MG tablet  UTI symptoms - Plan: POCT Urinalysis DIP (Proadvantage Device) She is to return here in 2 weeks for nurse visit for repeat urinalysis.

## 2020-08-16 NOTE — Progress Notes (Signed)
   Subjective:    Patient ID: Phyllis Campbell, female    DOB: 29-Apr-1932, 86 y.o.   MRN: [id number]  HPI She states that she noted a foul smell to her urine and had some dysuria this morning.  Last week she did have symptoms of sore throat and headache but they are now gone.  She has had her Covid booster.    Review of Systems     Objective:   Physical Exam        Assessment & Plan:

## 2020-08-30 ENCOUNTER — Other Ambulatory Visit: Payer: Self-pay

## 2020-08-30 ENCOUNTER — Other Ambulatory Visit (INDEPENDENT_AMBULATORY_CARE_PROVIDER_SITE_OTHER): Payer: Medicare PPO

## 2020-08-30 ENCOUNTER — Telehealth: Payer: Self-pay

## 2020-08-30 DIAGNOSIS — R399 Unspecified symptoms and signs involving the genitourinary system: Secondary | ICD-10-CM

## 2020-08-30 LAB — POCT URINALYSIS DIP (PROADVANTAGE DEVICE)
Bilirubin, UA: NEGATIVE
Blood, UA: NEGATIVE
Glucose, UA: NEGATIVE mg/dL
Ketones, POC UA: NEGATIVE mg/dL
Leukocytes, UA: NEGATIVE
Nitrite, UA: NEGATIVE
Protein Ur, POC: NEGATIVE mg/dL
Specific Gravity, Urine: 1.025
Urobilinogen, Ur: 0.2
pH, UA: 5.5 (ref 5.0–8.0)

## 2020-08-30 NOTE — Telephone Encounter (Signed)
Pt returned for repeat U/ A. All was clear. Please advise [facility]

## 2020-08-30 NOTE — Telephone Encounter (Signed)
Pt was advised Kh 

## 2020-08-30 NOTE — Telephone Encounter (Signed)
Let her know that the urine was clear.

## 2020-09-20 ENCOUNTER — Ambulatory Visit
Admission: RE | Admit: 2020-09-20 | Discharge: 2020-09-20 | Disposition: A | Discharge status: Home / Self Care | Payer: Medicare PPO | Source: Ambulatory Visit | Attending: Family Medicine | Admitting: Family Medicine

## 2020-09-20 ENCOUNTER — Other Ambulatory Visit: Payer: Self-pay

## 2020-09-20 ENCOUNTER — Ambulatory Visit: Payer: Medicare PPO | Admitting: Family Medicine

## 2020-09-20 DIAGNOSIS — M85852 Other specified disorders of bone density and structure, left thigh: Secondary | ICD-10-CM | POA: Diagnosis not present

## 2020-09-20 DIAGNOSIS — Z780 Asymptomatic menopausal state: Secondary | ICD-10-CM | POA: Diagnosis not present

## 2020-09-26 DIAGNOSIS — M5416 Radiculopathy, lumbar region: Secondary | ICD-10-CM | POA: Diagnosis not present

## 2020-10-03 ENCOUNTER — Other Ambulatory Visit (INDEPENDENT_AMBULATORY_CARE_PROVIDER_SITE_OTHER): Payer: Medicare PPO

## 2020-10-03 ENCOUNTER — Other Ambulatory Visit: Payer: Self-pay

## 2020-10-03 DIAGNOSIS — Z23 Encounter for immunization: Secondary | ICD-10-CM

## 2020-10-11 DIAGNOSIS — M5416 Radiculopathy, lumbar region: Secondary | ICD-10-CM | POA: Diagnosis not present

## 2020-10-11 DIAGNOSIS — M48061 Spinal stenosis, lumbar region without neurogenic claudication: Secondary | ICD-10-CM | POA: Diagnosis not present

## 2020-10-13 ENCOUNTER — Encounter: Payer: Self-pay | Admitting: Family Medicine

## 2020-10-13 ENCOUNTER — Ambulatory Visit (INDEPENDENT_AMBULATORY_CARE_PROVIDER_SITE_OTHER): Payer: Medicare PPO | Admitting: Family Medicine

## 2020-10-13 ENCOUNTER — Other Ambulatory Visit: Payer: Self-pay

## 2020-10-13 VITALS — BP 120/76 | HR 72 | Temp 97.3°F | Ht 65.0 in | Wt 202.6 lb

## 2020-10-13 DIAGNOSIS — E1159 Type 2 diabetes mellitus with other circulatory complications: Secondary | ICD-10-CM | POA: Diagnosis not present

## 2020-10-13 DIAGNOSIS — M5442 Lumbago with sciatica, left side: Secondary | ICD-10-CM

## 2020-10-13 DIAGNOSIS — I70213 Atherosclerosis of native arteries of extremities with intermittent claudication, bilateral legs: Secondary | ICD-10-CM | POA: Diagnosis not present

## 2020-10-13 DIAGNOSIS — M1990 Unspecified osteoarthritis, unspecified site: Secondary | ICD-10-CM

## 2020-10-13 DIAGNOSIS — E1169 Type 2 diabetes mellitus with other specified complication: Secondary | ICD-10-CM

## 2020-10-13 DIAGNOSIS — I152 Hypertension secondary to endocrine disorders: Secondary | ICD-10-CM

## 2020-10-13 DIAGNOSIS — K219 Gastro-esophageal reflux disease without esophagitis: Secondary | ICD-10-CM | POA: Diagnosis not present

## 2020-10-13 DIAGNOSIS — M810 Age-related osteoporosis without current pathological fracture: Secondary | ICD-10-CM | POA: Diagnosis not present

## 2020-10-13 DIAGNOSIS — M5441 Lumbago with sciatica, right side: Secondary | ICD-10-CM

## 2020-10-13 DIAGNOSIS — E118 Type 2 diabetes mellitus with unspecified complications: Secondary | ICD-10-CM

## 2020-10-13 DIAGNOSIS — E785 Hyperlipidemia, unspecified: Secondary | ICD-10-CM | POA: Diagnosis not present

## 2020-10-13 DIAGNOSIS — J301 Allergic rhinitis due to pollen: Secondary | ICD-10-CM

## 2020-10-13 DIAGNOSIS — G8929 Other chronic pain: Secondary | ICD-10-CM

## 2020-10-13 LAB — POCT GLYCOSYLATED HEMOGLOBIN (HGB A1C): Hemoglobin A1C: 6.3 % — AB (ref 4.0–5.6)

## 2020-10-13 MED ORDER — LOSARTAN POTASSIUM-HCTZ 50-12.5 MG PO TABS: 1.0000 | ORAL_TABLET | Freq: Every day | ORAL | 3 refills | Status: DC

## 2020-10-13 MED ORDER — SIMVASTATIN 40 MG PO TABS: 40.0000 mg | ORAL_TABLET | Freq: Every day | ORAL | 3 refills | Status: DC

## 2020-10-13 NOTE — Progress Notes (Signed)
Phyllis Campbell is a 87 y.o. female who presents for annual wellness visit and follow-up on chronic medical conditions.  She has  Diabetes but not on medications for that.  She continues on simvastatin without aches or pains.  She is also taking losartan/HCTZ without difficulty.  Reflux seems to be under fairly good control with occasional use of Pepcid.  She recently had an evaluation for her PAD.  She did fall 1 time at home and does occasionally use a cane when she is out walking around she has had a recent DEXA scan which did show osteoporosis.  She is now taking tramadol for her back pain is being followed by orthopedics for that.  She has had a recent eye exam. Immunizations and Health Maintenance Immunization History  Administered Date(s) Administered  . DTaP 08/11/1998  . Fluad Quad(high Dose 65+) 06/18/2019, 10/03/2020  . Influenza Split 07/09/2011, 07/30/2012  . Influenza Whole 09/10/2006, 09/16/2007, 08/09/2009  . Influenza, High Dose Seasonal PF 07/28/2013, 08/10/2014, 08/16/2015, 06/13/2016, 07/10/2017, 08/07/2018  . PFIZER SARS-COV-2 Vaccination 12/06/2019, 01/05/2020, 07/12/2020  . Pneumococcal Conjugate-13 08/29/2015  . Pneumococcal Polysaccharide-23 05/12/2006, 10/19/2013  . Tdap 05/28/2007, 02/19/2017  . Zoster 05/12/2006  . Zoster Recombinat (Shingrix) 02/19/2017, 05/03/2017   Health Maintenance Due  Topic Date Due  . FOOT EXAM  03/10/2020  . OPHTHALMOLOGY EXAM  05/04/2020    Last Pap smear: aged out Last mammogram: 06/20/12 Last colonoscopy: over ten years Last DEXA: 09/20/20 Dentist: N/A Ophtho: Q year  Exercise: water exercise three days a week  Other doctors caring for patient include: Dr. Christain [place] [facility],   Advanced directives: Does Patient Have a Medical Advance Directive?: No Would patient like information on creating a medical advance directive?: Yes (ED - Information included in AVS)  Depression screen:  See questionnaire below.  Depression screen [facility] 2/9  10/13/2020 05/17/2020 03/11/2019 08/07/2018 07/10/2017  Decreased Interest 0 1 0 0 0  Down, Depressed, Hopeless 0 0 0 0 0  PHQ - 2 Score 0 1 0 0 0    Fall Risk Screen: see questionnaire below. Fall Risk  10/13/2020 05/17/2020 03/11/2019 08/07/2018 07/10/2017  Falls in the past year? 1 0 0 Yes Yes  Number falls in past yr: 0 - - 1 1  Injury with Fall? 0 - - No No  Risk for fall due to : Impaired balance/gait No Fall Risks - - Impaired balance/gait    ADL screen:  See questionnaire below Functional Status Survey:     Review of Systems Constitutional: -, -unexpected weight change, -anorexia, -fatigue Allergy: -sneezing, -itching, -congestion Dermatology: denies changing moles, rash, lumps ENT: -runny nose, -ear pain, -sore throat,  Cardiology:  -chest pain, -palpitations, -orthopnea, Respiratory: -cough, -shortness of breath, -dyspnea on exertion, -wheezing,  Gastroenterology: -abdominal pain, -nausea, -vomiting, -diarrhea, -constipation, -dysphagia Hematology: -bleeding or bruising problems Musculoskeletal: -arthralgias, -myalgias, -joint swelling, -back pain, - Ophthalmology: -vision changes,  Urology: -dysuria, -difficulty urinating,  -urinary frequency, -urgency, incontinence Neurology: -, -numbness, , -memory loss, -falls, -dizziness    PHYSICAL EXAM:  BP 120/76   Pulse 72   Temp (!) 97.3 F (36.3 C)   Ht 5\' 5"  (1.651 m)   Wt 202 lb 9.6 oz (91.9 kg)   SpO2 96%   BMI 33.71 kg/m   General Appearance: Alert, cooperative, no distress, appears stated age Head: Normocephalic, without obvious abnormality, atraumatic Eyes: PERRL, conjunctiva/corneas clear, EOM's intact,  Ears: Normal TM's and external ear canals Nose: Nares normal, mucosa normal, no drainage or sinus tenderness Throat:  Lips, mucosa, and tongue normal; teeth and gums normal Neck: Supple, no lymphadenopathy;  thyroid:  no enlargement/tenderness/nodules; no carotid bruit or JVD Lungs: Clear to auscultation  bilaterally without wheezes, rales or ronchi; respirations unlabored Heart: Regular rate and rhythm, S1 and S2 normal, no murmur, rubor gallop Abdomen: Soft, non-tender, nondistended, normoactive bowel sounds,  no masses, no hepatosplenomegaly Extremities: No clubbing, cyanosis or edema  Skin:  Skin color, texture, turgor normal, no rashes or lesions Lymph nodes: Cervical, supraclavicular, and axillary nodes normal Neurologic:  CNII-XII intact, normal strength, sensation and gait; reflexes 2+ and symmetric throughout Psych: Normal mood, affect, hygiene and grooming. Hemoglobin A1c is 6.5 ASSESSMENT/PLAN: DM (diabetes mellitus) with complications (HCC) - Plan: CBC with Differential/Platelet, Comprehensive metabolic panel, Lipid panel, POCT glycosylated hemoglobin (Hb A1C)  Non-seasonal allergic rhinitis due to pollen  Arthritis  Chronic bilateral low back pain with bilateral sciatica  Gastroesophageal reflux disease without esophagitis  Atherosclerosis of native artery of both lower extremities with intermittent claudication (HCC) - Plan: CBC with Differential/Platelet, Comprehensive metabolic panel, Lipid panel  Osteoporosis without current pathological fracture, unspecified osteoporosis type - Plan: CBC with Differential/Platelet, Comprehensive metabolic panel  Hyperlipidemia associated with type 2 diabetes mellitus (HCC) - Plan: Lipid panel, simvastatin (ZOCOR) 40 MG tablet  Hypertension complicating diabetes (HCC) - Plan: losartan-hydrochlorothiazide (HYZAAR) 50-12.5 MG tablet  She will continue to be followed by the pain specialist.  She has had epidurals but is now switched over to tramadol.  Continue on her present medication regimen. I will try to get her on Prolia for her osteoporosis.     Immunization recommendations discussed.  Colonoscopy recommendations reviewed   Medicare Attestation I have personally reviewed: The patient's medical and social history Their use of  alcohol, tobacco or illicit drugs Their current medications and supplements The patient's functional ability including ADLs,fall risks, home safety risks, cognitive, and hearing and visual impairment Diet and physical activities Evidence for depression or mood disorders  The patient's weight, height, and BMI have been recorded in the chart.  I have made referrals, counseling, and provided education to the patient based on review of the above and I have provided the patient with a written personalized care plan for preventive services.     Sharlot Gowda, MD   10/13/2020

## 2020-10-14 LAB — COMPREHENSIVE METABOLIC PANEL
ALT: 19 IU/L (ref 0–32)
AST: 22 IU/L (ref 0–40)
Albumin/Globulin Ratio: 1.4 (ref 1.2–2.2)
Albumin: 4.1 g/dL (ref 3.6–4.6)
Alkaline Phosphatase: 93 IU/L (ref 44–121)
BUN/Creatinine Ratio: 17 (ref 12–28)
BUN: 15 mg/dL (ref 8–27)
Bilirubin Total: 0.5 mg/dL (ref 0.0–1.2)
CO2: 25 mmol/L (ref 20–29)
Calcium: 9.8 mg/dL (ref 8.7–10.3)
Chloride: 103 mmol/L (ref 96–106)
Creatinine, Ser: 0.87 mg/dL (ref 0.57–1.00)
GFR calc Af Amer: 70 mL/min/{1.73_m2} (ref >59)
GFR calc non Af Amer: 61 mL/min/{1.73_m2} (ref >59)
Globulin, Total: 3 g/dL (ref 1.5–4.5)
Glucose: 88 mg/dL (ref 65–99)
Potassium: 3.8 mmol/L (ref 3.5–5.2)
Sodium: 140 mmol/L (ref 134–144)
Total Protein: 7.1 g/dL (ref 6.0–8.5)

## 2020-10-14 LAB — CBC WITH DIFFERENTIAL/PLATELET
Basophils Absolute: 0 10*3/uL (ref 0.0–0.2)
Basos: 1 %
EOS (ABSOLUTE): 0.1 10*3/uL (ref 0.0–0.4)
Eos: 1 %
Hematocrit: 35.7 % (ref 34.0–46.6)
Hemoglobin: 12.3 g/dL (ref 11.1–15.9)
Immature Grans (Abs): 0 10*3/uL (ref 0.0–0.1)
Immature Granulocytes: 0 %
Lymphocytes Absolute: 1.6 10*3/uL (ref 0.7–3.1)
Lymphs: 41 %
MCH: 32.2 pg (ref 26.6–33.0)
MCHC: 34.5 g/dL (ref 31.5–35.7)
MCV: 94 fL (ref 79–97)
Monocytes Absolute: 0.4 10*3/uL (ref 0.1–0.9)
Monocytes: 9 %
Neutrophils Absolute: 1.9 10*3/uL (ref 1.4–7.0)
Neutrophils: 48 %
Platelets: 186 10*3/uL (ref 150–450)
RBC: 3.82 x10E6/uL (ref 3.77–5.28)
RDW: 13.1 % (ref 11.7–15.4)
WBC: 3.9 10*3/uL (ref 3.4–10.8)

## 2020-10-14 LAB — LIPID PANEL
Chol/HDL Ratio: 2.1 ratio (ref 0.0–4.4)
Cholesterol, Total: 128 mg/dL (ref 100–199)
HDL: 60 mg/dL (ref >39)
LDL Chol Calc (NIH): 54 mg/dL (ref 0–99)
Triglycerides: 66 mg/dL (ref 0–149)
VLDL Cholesterol Cal: 14 mg/dL (ref 5–40)

## 2020-10-19 ENCOUNTER — Ambulatory Visit: Payer: Medicare PPO | Admitting: Family Medicine

## 2020-10-20 ENCOUNTER — Encounter: Payer: Self-pay | Admitting: Family Medicine

## 2020-10-20 ENCOUNTER — Other Ambulatory Visit: Payer: Self-pay

## 2020-10-20 ENCOUNTER — Telehealth: Payer: Medicare PPO | Admitting: Family Medicine

## 2020-10-20 VITALS — BP 120/76 | Wt 202.0 lb

## 2020-10-20 DIAGNOSIS — R0781 Pleurodynia: Secondary | ICD-10-CM

## 2020-10-20 NOTE — Progress Notes (Signed)
   Subjective:    Patient ID: Phyllis Campbell, female    DOB: March 21, 1932, 87 y.o.   MRN: [id number]  HPI I connected with  Iyania Denne Folmer on 10/20/20 by a video enabled telemedicine application and verified that I am speaking with the correct person using two identifiers.  Caregility used.  I am at home.  She is at her house. I discussed the limitations of evaluation and management by telemedicine. The patient expressed understanding and agreed to proceed. She fell out of a chair last night and complains of left lateral rib pain.  Minimal shortness of breath and no abdominal pain.   Review of Systems     Objective:   Physical Exam Alert and in no distress otherwise not examined       Assessment & Plan:  Rib pain on left side I recommended treating with Tylenol.  Discussed worsening shortness of breath or abdominal pain.  Either of these if she has them, recommend she go to the hospital.  She expressed understanding of this. Over 20 minutes spent in reviewing her record today, history documentation and consultation.

## 2020-11-08 ENCOUNTER — Other Ambulatory Visit: Payer: Self-pay

## 2020-11-08 NOTE — Telephone Encounter (Signed)
Pt was advised of her PA needed for prolia. She was also advise once we have more info we will contact her. KH

## 2020-11-23 ENCOUNTER — Telehealth: Payer: Self-pay

## 2020-11-23 NOTE — Telephone Encounter (Signed)
Please send in osteo med to [address] [facility] [address]. Please advise when done. Thanks [employer]

## 2020-11-24 MED ORDER — ALENDRONATE SODIUM 70 MG PO TABS: 70.0000 mg | ORAL_TABLET | ORAL | 3 refills | Status: DC

## 2020-11-24 NOTE — Addendum Note (Signed)
Addended by: Ronnald Nian on: 11/24/2020 12:49 PM   Modules accepted: Orders

## 2020-12-09 ENCOUNTER — Telehealth: Payer: Self-pay | Admitting: Family Medicine

## 2020-12-09 DIAGNOSIS — N3001 Acute cystitis with hematuria: Secondary | ICD-10-CM

## 2020-12-09 MED ORDER — SULFAMETHOXAZOLE-TRIMETHOPRIM 800-160 MG PO TABS: 1.0000 | ORAL_TABLET | Freq: Two times a day (BID) | ORAL | 0 refills | Status: DC

## 2020-12-09 NOTE — Telephone Encounter (Signed)
Let her know that I called an antibiotic in for her but if it does not work she will need an appointment

## 2020-12-09 NOTE — Telephone Encounter (Signed)
Pt called and wanted to see if she could get a refill on the antibiotic that was prescribed to her the last time she had a sinus infection. She said she is having dizziness and sharp pains over her right eye that has been going on for 3 days now. She was advised that she would more than likely need a virtual visit but she declined seeing the other providers

## 2020-12-09 NOTE — Telephone Encounter (Signed)
Pt was advised KH 

## 2020-12-22 ENCOUNTER — Other Ambulatory Visit: Payer: Self-pay | Admitting: Family Medicine

## 2020-12-22 DIAGNOSIS — E1169 Type 2 diabetes mellitus with other specified complication: Secondary | ICD-10-CM

## 2021-01-12 DIAGNOSIS — M5416 Radiculopathy, lumbar region: Secondary | ICD-10-CM | POA: Diagnosis not present

## 2021-01-16 ENCOUNTER — Other Ambulatory Visit: Payer: Self-pay | Admitting: Family Medicine

## 2021-01-16 DIAGNOSIS — I152 Hypertension secondary to endocrine disorders: Secondary | ICD-10-CM

## 2021-02-09 DIAGNOSIS — M5416 Radiculopathy, lumbar region: Secondary | ICD-10-CM | POA: Diagnosis not present

## 2021-02-09 DIAGNOSIS — M48061 Spinal stenosis, lumbar region without neurogenic claudication: Secondary | ICD-10-CM | POA: Diagnosis not present

## 2021-02-14 ENCOUNTER — Other Ambulatory Visit: Payer: Self-pay

## 2021-02-14 DIAGNOSIS — I739 Peripheral vascular disease, unspecified: Secondary | ICD-10-CM

## 2021-03-15 ENCOUNTER — Other Ambulatory Visit: Payer: Self-pay

## 2021-03-15 ENCOUNTER — Ambulatory Visit (HOSPITAL_COMMUNITY)
Admission: RE | Admit: 2021-03-15 | Discharge: 2021-03-15 | Disposition: A | Discharge status: Home / Self Care | Payer: Medicare PPO | Source: Ambulatory Visit | Attending: Vascular Surgery | Admitting: Vascular Surgery

## 2021-03-15 ENCOUNTER — Ambulatory Visit (INDEPENDENT_AMBULATORY_CARE_PROVIDER_SITE_OTHER): Payer: Medicare PPO | Admitting: Physician Assistant

## 2021-03-15 VITALS — BP 114/73 | HR 66 | Temp 97.6°F | Resp 20 | Ht 65.0 in | Wt 208.0 lb

## 2021-03-15 DIAGNOSIS — I739 Peripheral vascular disease, unspecified: Secondary | ICD-10-CM | POA: Diagnosis not present

## 2021-03-15 NOTE — Progress Notes (Signed)
Office Note     CC:  follow up Requesting Provider:  Ronnald Nian, MD  HPI: Phyllis Campbell is a 87 y.o. (11/13/1931) female who presents to go over vascular studies related to PAD.  She was seen 1 year ago for the same.  She is seen today sitting in a wheelchair however states she walks with a cane.  She is accompanied by her granddaughter.  She reports she has severe scoliosis with radiculopathy involving her right leg which is always painful and numb.  She denies any rest pain in her feet or nonhealing wounds.  She is also followed for diabetic foot checks regularly.  She is taking aspirin and statin daily.  She denies tobacco use.   Past Medical History:  Diagnosis Date  . Allergy   . Arthritis   . Cataracts, bilateral   . Diabetes mellitus   . DVT (deep venous thrombosis) (HCC)   . Dyslipidemia   . HH (hiatus hernia)   . Hypertension   . Obesity   . Postmenopausal   . Stroke (HCC)   . Thyromegaly     Past Surgical History:  Procedure Laterality Date  . ABDOMINAL HYSTERECTOMY    . cataracts    . CESAREAN SECTION    . COLONOSCOPY  2004  . EYE SURGERY    . SHOULDER SURGERY      Social History   Socioeconomic History  . Marital status: Married    Spouse name: Not on file  . Number of children: Not on file  . Years of education: Not on file  . Highest education level: Not on file  Occupational History  . Not on file  Tobacco Use  . Smoking status: Never Smoker  . Smokeless tobacco: Never Used  Vaping Use  . Vaping Use: Never used  Substance and Sexual Activity  . Alcohol use: No  . Drug use: No  . Sexual activity: Not Currently  Other Topics Concern  . Not on file  Social History Narrative  . Not on file   Social Determinants of Health   Financial Resource Strain: Not on file  Food Insecurity: Not on file  Transportation Needs: Not on file  Physical Activity: Not on file  Stress: Not on file  Social Connections: Not on file  Intimate Partner  Violence: Not on file   History reviewed. No pertinent family history.  Current Outpatient Medications  Medication Sig Dispense Refill  . Accu-Chek Softclix Lancets lancets 1 each by Other route daily. Use as instructed 100 each 3  . acetaminophen (TYLENOL) 650 MG CR tablet Take 650 mg by mouth every 8 (eight) hours as needed for pain. Takes 2 tablets in AM and 2 tablets in PM.    . alendronate (FOSAMAX) 70 MG tablet Take 1 tablet (70 mg total) by mouth every 7 (seven) days. Take with a full glass of water on an empty stomach. 12 tablet 3  . aspirin EC 81 MG tablet Take 81 mg by mouth daily.    . Calcium Carb-Cholecalciferol (CALCIUM 600+D3 PO) Take by mouth.    . Chlorpheniramine-DM (CORICIDIN HBP COUGH/COLD PO) Take 2 tablets by mouth as needed.    . famotidine (PEPCID) 20 MG tablet Take 20 mg by mouth as needed for heartburn or indigestion.    . Ginkgo Biloba 40 MG TABS Take 60 mg by mouth as needed. Reported on 02/08/2016    . glucose blood test strip 1 each by Other route as needed. Use as  instructed 100 each 3  . ibuprofen (ADVIL,MOTRIN) 200 MG tablet Take 200 mg by mouth every 6 (six) hours as needed. Reported on 02/08/2016    . Liniments (SALONPAS ARTHRITIS PAIN RELIEF EX) Apply topically.    Marland Kitchen losartan-hydrochlorothiazide (HYZAAR) 50-12.5 MG tablet TAKE 1 TABLET EVERY DAY 90 tablet 0  . meclizine (ANTIVERT) 12.5 MG tablet TAKE 1 TABLET BY MOUTH THREE TIMES DAILY AS NEEDED FOR  DIZZINESS 30 tablet 0  . naproxen sodium (ALEVE) 220 MG tablet Take 220 mg by mouth daily as needed.    . Pseudoephedrine-Ibuprofen (ADVIL COLD/SINUS PO) Take 2 tablets by mouth as needed.    . RESTASIS 0.05 % ophthalmic emulsion Place 1 drop into both eyes as needed. 1.5 mL 1  . simvastatin (ZOCOR) 40 MG tablet TAKE 1 TABLET EVERY DAY 90 tablet 0  . methocarbamol (ROBAXIN) 500 MG tablet Take 1 tablet (500 mg total) by mouth at bedtime as needed for muscle spasms. (Patient not taking: No sig reported) 30 tablet 1   . sulfamethoxazole-trimethoprim (BACTRIM DS) 800-160 MG tablet Take 1 tablet by mouth 2 (two) times daily. (Patient not taking: Reported on 03/15/2021) 20 tablet 0  . traMADol (ULTRAM) 50 MG tablet Take 50 mg by mouth every 6 (six) hours as needed. (Patient not taking: Reported on 03/15/2021)     No current facility-administered medications for this visit.    No Known Allergies   REVIEW OF SYSTEMS:   [X]  denotes positive finding, [ ]  denotes negative finding Cardiac  Comments:  Chest pain or chest pressure:    Shortness of breath upon exertion:    Short of breath when lying flat:    Irregular heart rhythm:        Vascular    Pain in calf, thigh, or hip brought on by ambulation:    Pain in feet at night that wakes you up from your sleep:     Blood clot in your veins:    Leg swelling:         Pulmonary    Oxygen at home:    Productive cough:     Wheezing:         Neurologic    Sudden weakness in arms or legs:     Sudden numbness in arms or legs:     Sudden onset of difficulty speaking or slurred speech:    Temporary loss of vision in one eye:     Problems with dizziness:         Gastrointestinal    Blood in stool:     Vomited blood:         Genitourinary    Burning when urinating:     Blood in urine:        Psychiatric    Major depression:         Hematologic    Bleeding problems:    Problems with blood clotting too easily:        Skin    Rashes or ulcers:        Constitutional    Fever or chills:      PHYSICAL EXAMINATION:  Vitals:   03/15/21 1135  BP: 114/73  Pulse: 66  Resp: 20  Temp: 97.6 F (36.4 C)  TempSrc: Temporal  SpO2: 96%  Weight: 208 lb (94.3 kg)  Height: 5\' 5"  (1.651 m)    General:  WDWN in NAD; vital signs documented above Gait: Not observed HENT: WNL, normocephalic Pulmonary: normal non-labored breathing Cardiac: regular HR  Abdomen: soft, NT, no masses Skin: without rashes Vascular Exam/Pulses:  Right Left  Radial 2+  (normal) 2+ (normal)  DP 1+ (weak) 2+ (normal)  PT absent absent   Extremities: without ischemic changes, without Gangrene , without cellulitis; without open wounds;  Musculoskeletal: no muscle wasting or atrophy  Neurologic: A&O X 3;  No focal weakness or paresthesias are detected Psychiatric:  The pt has Normal affect.   Non-Invasive Vascular Imaging:   ABI/TBIToday's ABIToday's TBIPrevious ABIPrevious TBI  +-------+-----------+-----------+------------+------------+  Right 1.17    0.41    1.06    0.37      +-------+-----------+-----------+------------+------------+  Left  1.05    0.65    1.09    0.48      +-------+-----------+-----------+------------+------------+    ASSESSMENT/PLAN:: 87 y.o. female here for follow up for PAD  -Feet are warm and well-perfused without any nonhealing wounds or rest pain -ABIs are unchanged over the past year demonstrating multiphasic flow to the level of the ankles -Patient has known radiculopathy involving her right lower extremity; I do not believe arterial insufficiency is contributing to the symptoms.  No indication for further work-up with invasive procedure -She may follow-up in 1 year with repeat ABIs.  She knows to call/return office sooner with any questions or concerns   Emilie Rutter, PA-C [facility] [phone]  Clinic MD:   Edilia Bo

## 2021-03-16 ENCOUNTER — Encounter: Payer: Self-pay | Admitting: Family Medicine

## 2021-03-16 ENCOUNTER — Ambulatory Visit: Payer: Medicare PPO | Admitting: Family Medicine

## 2021-03-16 VITALS — BP 128/70 | HR 77 | Temp 96.5°F | Wt 201.6 lb

## 2021-03-16 DIAGNOSIS — E1159 Type 2 diabetes mellitus with other circulatory complications: Secondary | ICD-10-CM | POA: Diagnosis not present

## 2021-03-16 DIAGNOSIS — E1169 Type 2 diabetes mellitus with other specified complication: Secondary | ICD-10-CM | POA: Diagnosis not present

## 2021-03-16 DIAGNOSIS — M810 Age-related osteoporosis without current pathological fracture: Secondary | ICD-10-CM | POA: Diagnosis not present

## 2021-03-16 DIAGNOSIS — K5900 Constipation, unspecified: Secondary | ICD-10-CM | POA: Diagnosis not present

## 2021-03-16 DIAGNOSIS — K219 Gastro-esophageal reflux disease without esophagitis: Secondary | ICD-10-CM

## 2021-03-16 DIAGNOSIS — I152 Hypertension secondary to endocrine disorders: Secondary | ICD-10-CM

## 2021-03-16 DIAGNOSIS — E785 Hyperlipidemia, unspecified: Secondary | ICD-10-CM | POA: Diagnosis not present

## 2021-03-16 DIAGNOSIS — E118 Type 2 diabetes mellitus with unspecified complications: Secondary | ICD-10-CM | POA: Diagnosis not present

## 2021-03-16 DIAGNOSIS — J301 Allergic rhinitis due to pollen: Secondary | ICD-10-CM

## 2021-03-16 DIAGNOSIS — Z23 Encounter for immunization: Secondary | ICD-10-CM | POA: Diagnosis not present

## 2021-03-16 LAB — POCT GLYCOSYLATED HEMOGLOBIN (HGB A1C): Hemoglobin A1C: 6.2 % — AB (ref 4.0–5.6)

## 2021-03-16 NOTE — Patient Instructions (Signed)
Keep yourself well-hydrated and start using MiraLAX on a daily basis

## 2021-03-16 NOTE — Progress Notes (Signed)
   Subjective:    Patient ID: Phyllis Campbell, female    DOB: 18-Mar-1932, 87 y.o.   MRN: [id number]  HPI She is here for an interval evaluation.  Her main complaint today is difficulty with constipation.  She has been using OTC meds to help with that.  She continues on losartan/HCTZ as well as simvastatin.  She takes Pepcid on an as-needed basis for her reflux.  She is on Fosamax to help with her osteoporosis.  She does have underlying diabetes but presently is on no medications.  Her allergies are under good control.  Her medical record was reviewed.  She does not smoke or drink.   Review of Systems     Objective:   Physical Exam Alert and in no distress.  Cardiac exam shows regular rhythm without murmurs or gallops.  Lungs are clear to auscultation.  Hemoglobin A1c is 6.3       Assessment & Plan:  DM (diabetes mellitus) with complications (HCC) - Plan: HgB A1c  Immunization, viral disease - Plan: PFIZER Comirnaty(GRAY TOP)COVID-19 Vaccine  Non-seasonal allergic rhinitis due to pollen  Gastroesophageal reflux disease without esophagitis  Osteoporosis without current pathological fracture, unspecified osteoporosis type  Hyperlipidemia associated with type 2 diabetes mellitus (HCC)  Hypertension complicating diabetes (HCC) Encouraged her to continue on her present medication regimen.  Recommend regular use of MiraLAX to help with her constipation.  Did encourage her to become as physically active as possible.

## 2021-03-17 NOTE — Addendum Note (Signed)
Addended by: Ronnald Nian on: 03/17/2021 02:57 PM   Modules accepted: Level of Service

## 2021-03-22 ENCOUNTER — Other Ambulatory Visit: Payer: Self-pay | Admitting: Family Medicine

## 2021-03-22 DIAGNOSIS — E785 Hyperlipidemia, unspecified: Secondary | ICD-10-CM

## 2021-04-17 DIAGNOSIS — M5416 Radiculopathy, lumbar region: Secondary | ICD-10-CM | POA: Diagnosis not present

## 2021-05-04 ENCOUNTER — Telehealth: Payer: Self-pay | Admitting: Family Medicine

## 2021-05-04 MED ORDER — MECLIZINE HCL 12.5 MG PO TABS: ORAL_TABLET | ORAL | 0 refills | Status: DC

## 2021-05-04 NOTE — Telephone Encounter (Signed)
[facility]. Pt called for a refill of meclizine. Please [facility]  [facility] [address]

## 2021-05-08 DIAGNOSIS — M25561 Pain in right knee: Secondary | ICD-10-CM | POA: Diagnosis not present

## 2021-05-08 DIAGNOSIS — M5416 Radiculopathy, lumbar region: Secondary | ICD-10-CM | POA: Diagnosis not present

## 2021-05-08 DIAGNOSIS — I10 Essential (primary) hypertension: Secondary | ICD-10-CM | POA: Diagnosis not present

## 2021-05-08 DIAGNOSIS — M48061 Spinal stenosis, lumbar region without neurogenic claudication: Secondary | ICD-10-CM | POA: Diagnosis not present

## 2021-05-12 ENCOUNTER — Other Ambulatory Visit: Payer: Self-pay | Admitting: Family Medicine

## 2021-05-12 DIAGNOSIS — E1159 Type 2 diabetes mellitus with other circulatory complications: Secondary | ICD-10-CM

## 2021-05-12 DIAGNOSIS — I152 Hypertension secondary to endocrine disorders: Secondary | ICD-10-CM

## 2021-06-26 ENCOUNTER — Telehealth: Payer: Self-pay | Admitting: Family Medicine

## 2021-06-26 NOTE — Telephone Encounter (Signed)
Message was left for pt to call. Per JCL pt has  coughing and conjestion. If pt was tested negative for COVID he would like to see her in the office. If not do a virtual first.

## 2021-07-17 ENCOUNTER — Other Ambulatory Visit: Payer: Self-pay | Admitting: Family Medicine

## 2021-07-17 NOTE — Telephone Encounter (Signed)
[facility] is requesting to fill pt fosamax. Please advise KH °

## 2021-07-20 DIAGNOSIS — M5416 Radiculopathy, lumbar region: Secondary | ICD-10-CM | POA: Diagnosis not present

## 2021-07-21 DIAGNOSIS — E785 Hyperlipidemia, unspecified: Secondary | ICD-10-CM | POA: Diagnosis not present

## 2021-07-21 DIAGNOSIS — K08109 Complete loss of teeth, unspecified cause, unspecified class: Secondary | ICD-10-CM | POA: Diagnosis not present

## 2021-07-21 DIAGNOSIS — I10 Essential (primary) hypertension: Secondary | ICD-10-CM | POA: Diagnosis not present

## 2021-07-21 DIAGNOSIS — K219 Gastro-esophageal reflux disease without esophagitis: Secondary | ICD-10-CM | POA: Diagnosis not present

## 2021-07-21 DIAGNOSIS — K5900 Constipation, unspecified: Secondary | ICD-10-CM | POA: Diagnosis not present

## 2021-07-21 DIAGNOSIS — M1990 Unspecified osteoarthritis, unspecified site: Secondary | ICD-10-CM | POA: Diagnosis not present

## 2021-07-21 DIAGNOSIS — H04129 Dry eye syndrome of unspecified lacrimal gland: Secondary | ICD-10-CM | POA: Diagnosis not present

## 2021-07-21 DIAGNOSIS — G8929 Other chronic pain: Secondary | ICD-10-CM | POA: Diagnosis not present

## 2021-08-10 ENCOUNTER — Other Ambulatory Visit: Payer: Self-pay

## 2021-08-10 ENCOUNTER — Ambulatory Visit: Payer: Medicare PPO | Admitting: Family Medicine

## 2021-08-10 ENCOUNTER — Encounter: Payer: Self-pay | Admitting: Family Medicine

## 2021-08-10 VITALS — BP 128/62 | HR 74 | Temp 96.8°F | Wt 200.8 lb

## 2021-08-10 DIAGNOSIS — R5381 Other malaise: Secondary | ICD-10-CM

## 2021-08-10 DIAGNOSIS — T07XXXA Unspecified multiple injuries, initial encounter: Secondary | ICD-10-CM | POA: Diagnosis not present

## 2021-08-10 DIAGNOSIS — Z23 Encounter for immunization: Secondary | ICD-10-CM | POA: Diagnosis not present

## 2021-08-10 NOTE — Progress Notes (Signed)
Available  Subjective:    Patient ID: Phyllis Campbell, female    DOB: [birth year], 87 y.o.   MRN: [id number]  HPI Yesterday while turning around to go back into the living room from the kitchen, she lost her balance and fell and complains of left shoulder, hand and knee discomfort.  Her daughter also states that she has noticed that she tends to drag her right foot.  She does not complain of numbness, tingling or weakness.   Review of Systems     Objective:   Physical Exam Alert and in no distress.  No palpable tenderness to her shoulder with normal motion.  Wrist exam again shows no swelling or tenderness.  Normal strength.  Knee exam shows no abrasion, swelling or palpable discomfort.       Assessment & Plan:  Multiple contusions  Physical deconditioning  Immunization, viral disease - Plan: Art gallery manager Booster  Need for influenza vaccination - Plan: Flu Vaccine QUAD High Dose(Fluad) I explained that I think she is having difficulty with deconditioning as she has not kept up her normal physical activities.  [facility] Silver sneakers with walking as well as water aerobics.  Her immunizations were updated.

## 2021-08-17 DIAGNOSIS — M48061 Spinal stenosis, lumbar region without neurogenic claudication: Secondary | ICD-10-CM | POA: Diagnosis not present

## 2021-08-17 DIAGNOSIS — M5416 Radiculopathy, lumbar region: Secondary | ICD-10-CM | POA: Diagnosis not present

## 2021-08-17 DIAGNOSIS — M25561 Pain in right knee: Secondary | ICD-10-CM | POA: Diagnosis not present

## 2021-09-08 ENCOUNTER — Other Ambulatory Visit: Payer: Self-pay | Admitting: Family Medicine

## 2021-09-08 DIAGNOSIS — E1169 Type 2 diabetes mellitus with other specified complication: Secondary | ICD-10-CM

## 2021-10-04 ENCOUNTER — Other Ambulatory Visit: Payer: Self-pay

## 2021-10-04 ENCOUNTER — Ambulatory Visit: Payer: Medicare PPO | Admitting: Family Medicine

## 2021-10-04 VITALS — BP 132/70 | HR 68 | Wt 201.0 lb

## 2021-10-04 DIAGNOSIS — B353 Tinea pedis: Secondary | ICD-10-CM

## 2021-10-04 DIAGNOSIS — L84 Corns and callosities: Secondary | ICD-10-CM

## 2021-10-04 NOTE — Patient Instructions (Signed)
You have some toe padding on there and that Lamisil and that should work

## 2021-10-04 NOTE — Progress Notes (Signed)
° °  Subjective:    Patient ID: Phyllis Campbell, female    DOB: 1932/06/09, 87 y.o.   MRN: [id number]  HPI He is here for evaluation of discomfort between the right first and second toes.  This is been going on for a long time but she is not sure how long.   Review of Systems     Objective:   Physical Exam Exam of her right foot shows normal skin except at the webspace between the great and second toe where there is some slight scaling.  Distal to that on the medial aspect of the second toe is some slight discoloration but it is not hot red or tender.       Assessment & Plan:  Tinea pedis of right foot  Callus of foot Recommend using Lamisil to help with tinea.  And I mentioned using padding to help take the pressure off that particular area and use something akin to a small doughnut to take the pressure off that area.  She expressed understanding of this.

## 2021-10-16 ENCOUNTER — Encounter: Payer: Self-pay | Admitting: Family Medicine

## 2021-10-16 ENCOUNTER — Other Ambulatory Visit: Payer: Self-pay

## 2021-10-16 ENCOUNTER — Ambulatory Visit: Payer: Medicare PPO | Admitting: Family Medicine

## 2021-10-16 VITALS — BP 142/72 | HR 68 | Temp 97.7°F | Ht 64.0 in | Wt 200.8 lb

## 2021-10-16 DIAGNOSIS — J301 Allergic rhinitis due to pollen: Secondary | ICD-10-CM

## 2021-10-16 DIAGNOSIS — E1159 Type 2 diabetes mellitus with other circulatory complications: Secondary | ICD-10-CM

## 2021-10-16 DIAGNOSIS — M810 Age-related osteoporosis without current pathological fracture: Secondary | ICD-10-CM | POA: Diagnosis not present

## 2021-10-16 DIAGNOSIS — M5442 Lumbago with sciatica, left side: Secondary | ICD-10-CM

## 2021-10-16 DIAGNOSIS — E785 Hyperlipidemia, unspecified: Secondary | ICD-10-CM | POA: Diagnosis not present

## 2021-10-16 DIAGNOSIS — E1169 Type 2 diabetes mellitus with other specified complication: Secondary | ICD-10-CM

## 2021-10-16 DIAGNOSIS — R5381 Other malaise: Secondary | ICD-10-CM

## 2021-10-16 DIAGNOSIS — M1990 Unspecified osteoarthritis, unspecified site: Secondary | ICD-10-CM | POA: Diagnosis not present

## 2021-10-16 DIAGNOSIS — K219 Gastro-esophageal reflux disease without esophagitis: Secondary | ICD-10-CM | POA: Diagnosis not present

## 2021-10-16 DIAGNOSIS — E118 Type 2 diabetes mellitus with unspecified complications: Secondary | ICD-10-CM

## 2021-10-16 DIAGNOSIS — Z0000 Encounter for general adult medical examination without abnormal findings: Secondary | ICD-10-CM | POA: Diagnosis not present

## 2021-10-16 DIAGNOSIS — M5416 Radiculopathy, lumbar region: Secondary | ICD-10-CM

## 2021-10-16 DIAGNOSIS — M48061 Spinal stenosis, lumbar region without neurogenic claudication: Secondary | ICD-10-CM

## 2021-10-16 DIAGNOSIS — M5441 Lumbago with sciatica, right side: Secondary | ICD-10-CM

## 2021-10-16 DIAGNOSIS — I152 Hypertension secondary to endocrine disorders: Secondary | ICD-10-CM

## 2021-10-16 DIAGNOSIS — G8929 Other chronic pain: Secondary | ICD-10-CM

## 2021-10-16 LAB — POCT GLYCOSYLATED HEMOGLOBIN (HGB A1C): Hemoglobin A1C: 6.6 % — AB (ref 4.0–5.6)

## 2021-10-16 LAB — POCT UA - MICROALBUMIN
Albumin/Creatinine Ratio, Urine, POC: 11
Albumin/Creatinine Ratio, Urine, POC: 11
Creatinine, POC: 147 mg/dL
Creatinine, POC: 147 mg/dL
Microalbumin Ur, POC: 16.2 mg/L
Microalbumin Ur, POC: 16.2 mg/L

## 2021-10-16 MED ORDER — LOSARTAN POTASSIUM-HCTZ 50-12.5 MG PO TABS: 1.0000 | ORAL_TABLET | Freq: Every day | ORAL | 3 refills | Status: DC

## 2021-10-16 MED ORDER — SIMVASTATIN 40 MG PO TABS: 40.0000 mg | ORAL_TABLET | Freq: Every day | ORAL | 3 refills | Status: DC

## 2021-10-16 MED ORDER — ALENDRONATE SODIUM 70 MG PO TABS: ORAL_TABLET | ORAL | 3 refills | Status: DC

## 2021-10-16 NOTE — Progress Notes (Signed)
Phyllis Campbell is a 88 y.o. female who presents for annual wellness visit and follow-up on chronic medical conditions.  She has had difficulty recently with reflux symptoms and states the Pepcid has not helped her with these problems.  She also has been getting epidural injections every 3 months but states that after 2 or 3 days the pain comes back.  She notes that she can get relief of her symptoms with 2 extra strength Tylenol daily.  She does have dizziness but rarely uses the meclizine.  Her allergies are under good control and she is not presently on any medication.  She continues on simvastatin without aches or pains.  She is on Fosamax for her osteoporosis.  Continues on losartan/HCTZ and is having no difficulty with that.  She has seen [facility] for her PAD but not having any leg pains.  Presently she is not taking any diabetes medications.  She does see ophthalmology regularly.  Immunizations and Health Maintenance Immunization History  Administered Date(s) Administered   DTaP 08/11/1998   Fluad Quad(high Dose 65+) 06/18/2019, 10/03/2020, 08/10/2021   Influenza Split 07/09/2011, 07/30/2012   Influenza Whole 09/10/2006, 09/16/2007, 08/09/2009   Influenza, High Dose Seasonal PF 07/28/2013, 08/10/2014, 08/16/2015, 06/13/2016, 07/10/2017, 08/07/2018   PFIZER Comirnaty(Gray Top)Covid-19 Tri-Sucrose Vaccine 03/16/2021   PFIZER(Purple Top)SARS-COV-2 Vaccination 12/06/2019, 01/05/2020, 07/12/2020   Pfizer Covid-19 Vaccine Bivalent Booster 54yrs & up 08/10/2021   Pneumococcal Conjugate-13 08/29/2015   Pneumococcal Polysaccharide-23 05/12/2006, 10/19/2013   Tdap 05/28/2007, 02/19/2017   Zoster Recombinat (Shingrix) 02/19/2017, 05/03/2017   Zoster, Live 05/12/2006   Health Maintenance Due  Topic Date Due   OPHTHALMOLOGY EXAM  05/04/2020   HEMOGLOBIN A1C  09/15/2021   FOOT EXAM  10/13/2021    Last Pap smear: Aged out Last mammogram: 06/26/2012 aged out  Last colonoscopy: 07/12/2003 Dr. Randa Evens   Last DEXA: 09/20/20   Dentist: N/A dentures Ophtho: Q year Dr. Christain [place] Exercise:stretching 6 days a week    Other doctors caring for patient include: Dr.edwards [facility]            Dr. Christain [place] eye            Dr. Ophelia Charter ortho  Advanced directives: Does Patient Have a Medical Advance Directive?: No Would patient like information on creating a medical advance directive?: Yes (ED - Information included in AVS) Info given Depression screen:  See questionnaire below.  Depression screen [place] 2/9 10/16/2021 10/13/2020 05/17/2020 03/11/2019 08/07/2018  Decreased Interest 0 0 1 0 0  Down, Depressed, Hopeless 0 0 0 0 0  PHQ - 2 Score 0 0 1 0 0    Fall Risk Screen: see questionnaire below. Fall Risk  10/16/2021 10/13/2020 05/17/2020 03/11/2019 08/07/2018  Falls in the past year? 1 1 0 0 Yes  Number falls in past yr: 1 0 - - 1  Injury with Fall? 0 0 - - No  Risk for fall due to : Other (Comment) Impaired balance/gait No Fall Risks - -  Follow up Falls evaluation completed - - - -    ADL screen:  See questionnaire below Functional Status Survey: Is the patient deaf or have difficulty hearing?: No Does the patient have difficulty seeing, even when wearing glasses/contacts?: No Does the patient have difficulty concentrating, remembering, or making decisions?: No Does the patient have difficulty walking or climbing stairs?: No Does the patient have difficulty dressing or bathing?: No Does the patient have difficulty doing errands alone such as visiting a doctor's office or shopping?: No  Review of Systems Constitutional: -, -unexpected weight change, -anorexia, -fatigue Allergy: -sneezing, -itching, -congestion Dermatology: denies changing moles, rash, lumps ENT: -runny nose, -ear pain, -sore throat,  Cardiology:  -chest pain, -palpitations, -orthopnea, Respiratory: -cough, -shortness of breath, -dyspnea on exertion, -wheezing,  Gastroenterology: -abdominal pain, -nausea, -vomiting, -diarrhea, -constipation,  -dysphagia Hematology: -bleeding or bruising problems Musculoskeletal: -arthralgias, -myalgias, -joint swelling, -back pain, - Ophthalmology: -vision changes,  Urology: -dysuria, -difficulty urinating,  -urinary frequency, -urgency, incontinence Neurology: -, -numbness, , -memory loss, -falls, -dizziness    PHYSICAL EXAM:  General Appearance: Alert, cooperative, no distress, appears stated age Head: Normocephalic, without obvious abnormality, atraumatic Eyes: PERRL, conjunctiva/corneas clear, EOM's intact,  Ears: Normal TM's and external ear canals Nose: Nares normal, mucosa normal, no drainage or sinus tenderness Throat: Lips, mucosa, and tongue normal; teeth and gums normal Neck: Supple, no lymphadenopathy;  thyroid:  no enlargement/tenderness/nodules; no carotid bruit or JVD Lungs: Clear to auscultation bilaterally without wheezes, rales or ronchi; respirations unlabored Heart: Regular rate and rhythm, S1 and S2 normal, no murmur, rubor gallop Abdomen: Soft, non-tender, nondistended, normoactive bowel sounds,  no masses, no hepatosplenomegaly Extremities: No clubbing, cyanosis or edema Pulses: 2+ and symmetric all extremities Skin:  Skin color, texture, turgor normal, no rashes or lesions Lymph nodes: Cervical, supraclavicular, and axillary nodes normal Neurologic:  CNII-XII intact, normal strength, sensation and gait; reflexes 2+ and symmetric throughout Psych: Normal mood, affect, hygiene and grooming. Hemoglobin A1c is 6.6 ASSESSMENT/PLAN: Routine general medical examination at a health care facility  DM (diabetes mellitus) with complications (HCC) - Plan: POCT glycosylated hemoglobin (Hb A1C), POCT UA - Microalbumin, CBC with Differential/Platelet, Comprehensive metabolic panel, Lipid panel, POCT UA - Microalbumin  Physical deconditioning  Gastroesophageal reflux disease without esophagitis  Hyperlipidemia associated with type 2 diabetes mellitus (HCC) - Plan: Lipid  panel, simvastatin (ZOCOR) 40 MG tablet  Hypertension complicating diabetes (HCC) - Plan: CBC with Differential/Platelet, Comprehensive metabolic panel, losartan-hydrochlorothiazide (HYZAAR) 50-12.5 MG tablet  Osteoporosis without current pathological fracture, unspecified osteoporosis type - Plan: alendronate (FOSAMAX) 70 MG tablet  Non-seasonal allergic rhinitis due to pollen  Arthritis  Chronic bilateral low back pain with bilateral sciatica  Lumbar radiculopathy  Degenerative lumbar spinal stenosis Recommend she switch to Prilosec taking 40 mg for the next week or 2 and if no improvement, let me know.  I also discussed the radiculopathy/stenosis and since she is doing quite nicely on 2 extra think Tylenol daily, I recommend she continue on that and stop the epidural injections.  Continue on her other medications.  Overall she seems to be doing quite nicely.   Discussed  at least 30 minutes of aerobic activity at least 5 days/week and weight-bearing exercise 2x/week; ; healthy diet, including goals of calcium and vitamin D intake .  Immunization recommendations discussed.     Medicare Attestation I have personally reviewed: The patient's medical and social history Their use of alcohol, tobacco or illicit drugs Their current medications and supplements The patient's functional ability including ADLs,fall risks, home safety risks, cognitive, and hearing and visual impairment Diet and physical activities Evidence for depression or mood disorders  The patient's weight, height, and BMI have been recorded in the chart.  I have made referrals, counseling, and provided education to the patient based on review of the above and I have provided the patient with a written personalized care plan for preventive services.     Sharlot Gowda, MD   10/16/2021

## 2021-10-16 NOTE — Patient Instructions (Signed)
°  Phyllis Campbell , Thank you for taking time to come for your Medicare Wellness Visit. I appreciate your ongoing commitment to your health goals. Please review the following plan we discussed and let me know if I can assist you in the future.   These are the goals we discussed: Try Prilosec double dosing and do it for a week or 2 and if that does not help call me   This is a list of the screening recommended for you and due dates:  Health Maintenance  Topic Date Due   Eye exam for diabetics  05/04/2020   Hemoglobin A1C  09/15/2021   Complete foot exam   10/16/2022   Tetanus Vaccine  02/20/2027   Pneumonia Vaccine  Completed   Flu Shot  Completed   DEXA scan (bone density measurement)  Completed   COVID-19 Vaccine  Completed   Zoster (Shingles) Vaccine  Completed   HPV Vaccine  Aged Out

## 2021-10-17 LAB — COMPREHENSIVE METABOLIC PANEL
ALT: 15 IU/L (ref 0–32)
AST: 22 IU/L (ref 0–40)
Albumin/Globulin Ratio: 1.4 (ref 1.2–2.2)
Albumin: 4.6 g/dL (ref 3.6–4.6)
Alkaline Phosphatase: 104 IU/L (ref 44–121)
BUN/Creatinine Ratio: 22 (ref 12–28)
BUN: 20 mg/dL (ref 8–27)
Bilirubin Total: 0.5 mg/dL (ref 0.0–1.2)
CO2: 26 mmol/L (ref 20–29)
Calcium: 10.4 mg/dL — ABNORMAL HIGH (ref 8.7–10.3)
Chloride: 102 mmol/L (ref 96–106)
Creatinine, Ser: 0.9 mg/dL (ref 0.57–1.00)
Globulin, Total: 3.2 g/dL (ref 1.5–4.5)
Glucose: 115 mg/dL — ABNORMAL HIGH (ref 70–99)
Potassium: 4.1 mmol/L (ref 3.5–5.2)
Sodium: 140 mmol/L (ref 134–144)
Total Protein: 7.8 g/dL (ref 6.0–8.5)
eGFR: 62 mL/min/{1.73_m2} (ref >59)

## 2021-10-17 LAB — CBC WITH DIFFERENTIAL/PLATELET
Basophils Absolute: 0 10*3/uL (ref 0.0–0.2)
Basos: 1 %
EOS (ABSOLUTE): 0 10*3/uL (ref 0.0–0.4)
Eos: 1 %
Hematocrit: 36.9 % (ref 34.0–46.6)
Hemoglobin: 12.8 g/dL (ref 11.1–15.9)
Immature Grans (Abs): 0 10*3/uL (ref 0.0–0.1)
Immature Granulocytes: 0 %
Lymphocytes Absolute: 1.2 10*3/uL (ref 0.7–3.1)
Lymphs: 26 %
MCH: 31.3 pg (ref 26.6–33.0)
MCHC: 34.7 g/dL (ref 31.5–35.7)
MCV: 90 fL (ref 79–97)
Monocytes Absolute: 0.4 10*3/uL (ref 0.1–0.9)
Monocytes: 9 %
Neutrophils Absolute: 2.9 10*3/uL (ref 1.4–7.0)
Neutrophils: 63 %
Platelets: 234 10*3/uL (ref 150–450)
RBC: 4.09 x10E6/uL (ref 3.77–5.28)
RDW: 12.1 % (ref 11.7–15.4)
WBC: 4.5 10*3/uL (ref 3.4–10.8)

## 2021-10-17 LAB — LIPID PANEL
Chol/HDL Ratio: 2.5 ratio (ref 0.0–4.4)
Cholesterol, Total: 132 mg/dL (ref 100–199)
HDL: 52 mg/dL (ref >39)
LDL Chol Calc (NIH): 68 mg/dL (ref 0–99)
Triglycerides: 56 mg/dL (ref 0–149)
VLDL Cholesterol Cal: 12 mg/dL (ref 5–40)

## 2021-11-09 ENCOUNTER — Encounter: Payer: Self-pay | Admitting: Family Medicine

## 2021-11-30 DIAGNOSIS — Z961 Presence of intraocular lens: Secondary | ICD-10-CM | POA: Diagnosis not present

## 2021-11-30 DIAGNOSIS — H16223 Keratoconjunctivitis sicca, not specified as Sjogren's, bilateral: Secondary | ICD-10-CM | POA: Diagnosis not present

## 2021-11-30 DIAGNOSIS — H43813 Vitreous degeneration, bilateral: Secondary | ICD-10-CM | POA: Diagnosis not present

## 2021-11-30 DIAGNOSIS — H18413 Arcus senilis, bilateral: Secondary | ICD-10-CM | POA: Diagnosis not present

## 2021-12-28 ENCOUNTER — Other Ambulatory Visit: Payer: Self-pay | Admitting: Family Medicine

## 2021-12-28 DIAGNOSIS — I152 Hypertension secondary to endocrine disorders: Secondary | ICD-10-CM

## 2022-01-08 ENCOUNTER — Ambulatory Visit: Payer: Medicare PPO | Admitting: Podiatry

## 2022-01-08 ENCOUNTER — Encounter: Payer: Self-pay | Admitting: Podiatry

## 2022-01-08 DIAGNOSIS — M778 Other enthesopathies, not elsewhere classified: Secondary | ICD-10-CM

## 2022-01-08 DIAGNOSIS — M7751 Other enthesopathy of right foot: Secondary | ICD-10-CM | POA: Diagnosis not present

## 2022-01-08 DIAGNOSIS — M2041 Other hammer toe(s) (acquired), right foot: Secondary | ICD-10-CM | POA: Diagnosis not present

## 2022-01-08 DIAGNOSIS — I999 Unspecified disorder of circulatory system: Secondary | ICD-10-CM | POA: Diagnosis not present

## 2022-01-08 MED ORDER — TRIAMCINOLONE ACETONIDE 10 MG/ML IJ SUSP
20.0000 mg | Freq: Once | INTRAMUSCULAR | Status: AC
  Administered 2022-01-08: 20 mg

## 2022-01-08 NOTE — Progress Notes (Signed)
Subjective:  ? ?Patient ID: Phyllis Campbell, female   DOB: 88 y.o.   MRN: [id number]  ? ?HPI ?Patient presents with 2 separate problems with 1 being pain on top of the right foot second being pain in the right second toe with structural deformity of the foot and pain between the hallux and second digits.  Patient does not smoke is with caregiver and is not active ? ? ?Review of Systems  ?All other systems reviewed and are negative. ? ? ?   ?Objective:  ?Physical Exam ?Vitals and nursing note reviewed.  ?Constitutional:   ?   Appearance: She is well-developed.  ?Pulmonary:  ?   Effort: Pulmonary effort is normal.  ?Musculoskeletal:     ?   General: Normal range of motion.  ?Skin: ?   General: Skin is warm.  ?Neurological:  ?   Mental Status: She is alert.  ?  ?Vascular status found to be diminished with diminished PT and DP pulses bilateral with patient found to have abnormal sharp dull vibratory currently structural bunion hammertoe deformity inflammation second digit right inner phalangeal joint and inflammation of the extensor complex right with bone spur formation.  Patient's left hand history of problems but doing better now with patient having good digital perfusion and well oriented ? ?   ?Assessment:  ?Inflammatory capsulitis of the second digit right with structural pressure between the hallux second toe with tendinitis dorsally with spur formation which is pathological ? ?   ?Plan:  ?8 NP reviewed all conditions also vascular disease which is not causing severe claudication and if it were to start may require evaluation.  Today I work on 2 separate areas during careful injection dorsally of the extensor complex 3 mg dexamethasone Kenalog 5 mg Xylocaine and the second digit sterile prep and injected the inner phalangeal joint 2 mg dexamethasone 5 mg Xylocaine ?   ? ? ?

## 2022-02-08 ENCOUNTER — Other Ambulatory Visit: Payer: Self-pay | Admitting: Family Medicine

## 2022-02-09 NOTE — Telephone Encounter (Signed)
Pharmacy is requesting a refill on Meclizine ?

## 2022-03-12 ENCOUNTER — Other Ambulatory Visit: Payer: Self-pay | Admitting: Family Medicine

## 2022-03-12 DIAGNOSIS — E1169 Type 2 diabetes mellitus with other specified complication: Secondary | ICD-10-CM

## 2022-03-20 ENCOUNTER — Other Ambulatory Visit: Payer: Self-pay | Admitting: *Deleted

## 2022-03-20 DIAGNOSIS — I739 Peripheral vascular disease, unspecified: Secondary | ICD-10-CM

## 2022-03-26 NOTE — Progress Notes (Deleted)
HISTORY AND PHYSICAL     CC:  follow up. Requesting Provider:  Ronnald Nian, MD  HPI: [name] who is here today for follow up ABI's.  She was originally seen in 2018 by Dr. Imogene Campbell for numbness that was intermittent.  She did not have claudication.  She was found to have minimal PAD on the right and moderate PAD on the left.  Pt was last seen 03/15/2021 and at that time, she did have severe scoliosis with radiculopathy involving her right leg, which was always painful and numb.  She did not have any rest pain or non healing wounds.  She was followed for diabetic foot checks regularly.  Her ABI's were unchanged from the previous year demonstrating multiphasic flow to the level of the ankles.  She was scheduled for one year follow up.    The pt returns today for follow up.  ***  The pt is on a statin for cholesterol management.    The pt is on an aspirin.    Other AC:  none The pt is on ARB, HCTZ for hypertension.  The pt does  have diabetes. Tobacco hx:  never    Past Medical History:  Diagnosis Date   Allergy    Arthritis    Cataracts, bilateral    Diabetes mellitus    DVT (deep venous thrombosis) (HCC)    Dyslipidemia    HH (hiatus hernia)    Hypertension    Obesity    Postmenopausal    Stroke (HCC)    Thyromegaly     Past Surgical History:  Procedure Laterality Date   ABDOMINAL HYSTERECTOMY     cataracts     CESAREAN SECTION     COLONOSCOPY  2004   EYE SURGERY     SHOULDER SURGERY      No Known Allergies  Current Outpatient Medications  Medication Sig Dispense Refill   Accu-Chek Softclix Lancets lancets 1 each by Other route daily. Use as instructed 100 each 3   acetaminophen (TYLENOL) 650 MG CR tablet Take 650 mg by mouth every 8 (eight) hours as needed for pain. Takes 2 tablets in AM and 2 tablets in PM.     alendronate (FOSAMAX) 70 MG tablet TAKE 1 TABLET(70 MG) BY MOUTH EVERY 7 DAYS WITH A FULL GLASS OF WATER AND ON AN EMPTY STOMACH 12  tablet 3   aspirin EC 81 MG tablet Take 81 mg by mouth daily.     Calcium Carb-Cholecalciferol (CALCIUM 600+D3 PO) Take by mouth.     Chlorpheniramine-DM (CORICIDIN HBP COUGH/COLD PO) Take 2 tablets by mouth as needed.     Ginkgo Biloba 40 MG TABS Take 60 mg by mouth as needed. Reported on 02/08/2016     glucose blood test strip 1 each by Other route as needed. Use as instructed 100 each 3   ibuprofen (ADVIL,MOTRIN) 200 MG tablet Take 200 mg by mouth every 6 (six) hours as needed. Reported on 02/08/2016     Liniments (SALONPAS ARTHRITIS PAIN RELIEF EX) Apply topically.     losartan-hydrochlorothiazide (HYZAAR) 50-12.5 MG tablet TAKE 1 TABLET EVERY DAY 90 tablet 1   meclizine (ANTIVERT) 12.5 MG tablet TAKE 1 TABLET BY MOUTH THREE TIMES DAILY AS NEEDED FOR DIZZINESS 30 tablet 0   methocarbamol (ROBAXIN) 500 MG tablet Take 1 tablet (500 mg total) by mouth at bedtime as needed for muscle spasms. (Patient not taking: Reported on 10/16/2021) 30 tablet 1   naproxen sodium (ALEVE)  220 MG tablet Take 220 mg by mouth daily as needed.     Pseudoephedrine-Ibuprofen (ADVIL COLD/SINUS PO) Take 2 tablets by mouth as needed.     RESTASIS 0.05 % ophthalmic emulsion Place 1 drop into both eyes as needed. 1.5 mL 1   simvastatin (ZOCOR) 40 MG tablet TAKE 1 TABLET EVERY DAY 90 tablet 0   sulfamethoxazole-trimethoprim (BACTRIM DS) 800-160 MG tablet Take 1 tablet by mouth 2 (two) times daily. (Patient not taking: Reported on 10/16/2021) 20 tablet 0   traMADol (ULTRAM) 50 MG tablet Take 50 mg by mouth every 6 (six) hours as needed.     No current facility-administered medications for this visit.    No family history on file.  Social History   Socioeconomic History   Marital status: Married    Spouse name: Not on file   Number of children: Not on file   Years of education: Not on file   Highest education level: Not on file  Occupational History   Not on file  Tobacco Use   Smoking status: Never   Smokeless  tobacco: Never  Vaping Use   Vaping Use: Never used  Substance and Sexual Activity   Alcohol use: No   Drug use: No   Sexual activity: Not Currently  Other Topics Concern   Not on file  Social History Narrative   Not on file   Social Determinants of Health   Financial Resource Strain: Not on file  Food Insecurity: Not on file  Transportation Needs: Not on file  Physical Activity: Not on file  Stress: Not on file  Social Connections: Not on file  Intimate Partner Violence: Not on file     REVIEW OF SYSTEMS:  *** [X]  denotes positive finding, [ ]  denotes negative finding Cardiac  Comments:  Chest pain or chest pressure:    Shortness of breath upon exertion:    Short of breath when lying flat:    Irregular heart rhythm:        Vascular    Pain in calf, thigh, or hip brought on by ambulation:    Pain in feet at night that wakes you up from your sleep:     Blood clot in your veins:    Leg swelling:         Pulmonary    Oxygen at home:    Productive cough:     Wheezing:         Neurologic    Sudden weakness in arms or legs:     Sudden numbness in arms or legs:     Sudden onset of difficulty speaking or slurred speech:    Temporary loss of vision in one eye:     Problems with dizziness:         Gastrointestinal    Blood in stool:     Vomited blood:         Genitourinary    Burning when urinating:     Blood in urine:        Psychiatric    Major depression:         Hematologic    Bleeding problems:    Problems with blood clotting too easily:        Skin    Rashes or ulcers:        Constitutional    Fever or chills:      PHYSICAL EXAMINATION:  ***  General:  WDWN in NAD; vital signs documented above Gait: Not observed HENT:  WNL, normocephalic Pulmonary: normal non-labored breathing , without wheezing Cardiac: {Desc; regular/irreg:14544} HR, {With/Without:20273} carotid bruit*** Abdomen: soft, NT, no masses; aortic pulse is *** palpable Skin:  {With/Without:20273} rashes Vascular Exam/Pulses:  Right Left  Radial {Exam; arterial pulse strength 0-4:30167} {Exam; arterial pulse strength 0-4:30167}  Femoral {Exam; arterial pulse strength 0-4:30167} {Exam; arterial pulse strength 0-4:30167}  Popliteal {Exam; arterial pulse strength 0-4:30167} {Exam; arterial pulse strength 0-4:30167}  DP {Exam; arterial pulse strength 0-4:30167} {Exam; arterial pulse strength 0-4:30167}  PT {Exam; arterial pulse strength 0-4:30167} {Exam; arterial pulse strength 0-4:30167}  Peroneal *** ***   Extremities: {With/Without:20273} ischemic changes, {With/Without:20273} Gangrene , {With/Without:20273} cellulitis; {With/Without:20273} open wounds Musculoskeletal: no muscle wasting or atrophy  Neurologic: A&O X 3 Psychiatric:  The pt has {Desc; normal/abnormal:11317::"Normal"} affect.   Non-Invasive Vascular Imaging:   ABI's/TBI's on 04/02/2022: Right:  *** - Great toe pressure: *** Left:  *** - Great toe pressure: ***   Previous ABI's/TBI's on 03/15/2022: Right:  1.17/0.41 - Great toe pressure: 60 Left:  1.05/0.65 - Great toe pressure:  95    ASSESSMENT/PLAN:: 88 y.o. female here for follow up for PAD  -*** -continue *** -pt will f/u in *** with ***.   Doreatha Massed, [facility] [phone]  Clinic MD:   Myra Gianotti

## 2022-04-01 ENCOUNTER — Emergency Department (HOSPITAL_COMMUNITY): Payer: Medicare PPO

## 2022-04-01 ENCOUNTER — Other Ambulatory Visit: Payer: Self-pay

## 2022-04-01 ENCOUNTER — Encounter (HOSPITAL_COMMUNITY): Payer: Self-pay | Admitting: Emergency Medicine

## 2022-04-01 ENCOUNTER — Emergency Department (HOSPITAL_COMMUNITY)
Admission: EM | Admit: 2022-04-01 | Discharge: 2022-04-02 | Disposition: A | Discharge status: Home / Self Care | Payer: Medicare PPO | Source: Home / Self Care | Attending: Emergency Medicine | Admitting: Emergency Medicine

## 2022-04-01 DIAGNOSIS — R609 Edema, unspecified: Secondary | ICD-10-CM | POA: Diagnosis not present

## 2022-04-01 DIAGNOSIS — W19XXXD Unspecified fall, subsequent encounter: Secondary | ICD-10-CM | POA: Diagnosis not present

## 2022-04-01 DIAGNOSIS — M25552 Pain in left hip: Secondary | ICD-10-CM | POA: Diagnosis not present

## 2022-04-01 DIAGNOSIS — Z7982 Long term (current) use of aspirin: Secondary | ICD-10-CM | POA: Insufficient documentation

## 2022-04-01 DIAGNOSIS — W01198A Fall on same level from slipping, tripping and stumbling with subsequent striking against other object, initial encounter: Secondary | ICD-10-CM | POA: Insufficient documentation

## 2022-04-01 DIAGNOSIS — Z7983 Long term (current) use of bisphosphonates: Secondary | ICD-10-CM | POA: Diagnosis not present

## 2022-04-01 DIAGNOSIS — R519 Headache, unspecified: Secondary | ICD-10-CM | POA: Insufficient documentation

## 2022-04-01 DIAGNOSIS — K5900 Constipation, unspecified: Secondary | ICD-10-CM | POA: Diagnosis not present

## 2022-04-01 DIAGNOSIS — R7989 Other specified abnormal findings of blood chemistry: Secondary | ICD-10-CM | POA: Diagnosis not present

## 2022-04-01 DIAGNOSIS — M25462 Effusion, left knee: Secondary | ICD-10-CM | POA: Diagnosis present

## 2022-04-01 DIAGNOSIS — Z79891 Long term (current) use of opiate analgesic: Secondary | ICD-10-CM | POA: Diagnosis not present

## 2022-04-01 DIAGNOSIS — Z90710 Acquired absence of both cervix and uterus: Secondary | ICD-10-CM | POA: Diagnosis not present

## 2022-04-01 DIAGNOSIS — M1712 Unilateral primary osteoarthritis, left knee: Secondary | ICD-10-CM | POA: Diagnosis present

## 2022-04-01 DIAGNOSIS — M7122 Synovial cyst of popliteal space [Baker], left knee: Secondary | ICD-10-CM | POA: Diagnosis present

## 2022-04-01 DIAGNOSIS — E1169 Type 2 diabetes mellitus with other specified complication: Secondary | ICD-10-CM | POA: Diagnosis present

## 2022-04-01 DIAGNOSIS — W19XXXA Unspecified fall, initial encounter: Secondary | ICD-10-CM

## 2022-04-01 DIAGNOSIS — Z79899 Other long term (current) drug therapy: Secondary | ICD-10-CM | POA: Insufficient documentation

## 2022-04-01 DIAGNOSIS — E669 Obesity, unspecified: Secondary | ICD-10-CM | POA: Diagnosis present

## 2022-04-01 DIAGNOSIS — M25562 Pain in left knee: Secondary | ICD-10-CM

## 2022-04-01 DIAGNOSIS — Z6833 Body mass index (BMI) 33.0-33.9, adult: Secondary | ICD-10-CM | POA: Diagnosis not present

## 2022-04-01 DIAGNOSIS — M542 Cervicalgia: Secondary | ICD-10-CM | POA: Diagnosis not present

## 2022-04-01 DIAGNOSIS — E785 Hyperlipidemia, unspecified: Secondary | ICD-10-CM | POA: Diagnosis present

## 2022-04-01 DIAGNOSIS — E876 Hypokalemia: Secondary | ICD-10-CM | POA: Diagnosis present

## 2022-04-01 DIAGNOSIS — E118 Type 2 diabetes mellitus with unspecified complications: Secondary | ICD-10-CM | POA: Diagnosis not present

## 2022-04-01 DIAGNOSIS — Z86718 Personal history of other venous thrombosis and embolism: Secondary | ICD-10-CM | POA: Diagnosis not present

## 2022-04-01 DIAGNOSIS — M1612 Unilateral primary osteoarthritis, left hip: Secondary | ICD-10-CM | POA: Diagnosis not present

## 2022-04-01 DIAGNOSIS — E1151 Type 2 diabetes mellitus with diabetic peripheral angiopathy without gangrene: Secondary | ICD-10-CM | POA: Diagnosis present

## 2022-04-01 DIAGNOSIS — S79912A Unspecified injury of left hip, initial encounter: Secondary | ICD-10-CM | POA: Diagnosis not present

## 2022-04-01 DIAGNOSIS — Z885 Allergy status to narcotic agent status: Secondary | ICD-10-CM | POA: Diagnosis not present

## 2022-04-01 DIAGNOSIS — Z8673 Personal history of transient ischemic attack (TIA), and cerebral infarction without residual deficits: Secondary | ICD-10-CM | POA: Diagnosis not present

## 2022-04-01 DIAGNOSIS — K219 Gastro-esophageal reflux disease without esophagitis: Secondary | ICD-10-CM | POA: Diagnosis present

## 2022-04-01 DIAGNOSIS — M25511 Pain in right shoulder: Secondary | ICD-10-CM | POA: Diagnosis not present

## 2022-04-01 DIAGNOSIS — I10 Essential (primary) hypertension: Secondary | ICD-10-CM | POA: Diagnosis present

## 2022-04-01 DIAGNOSIS — Z20822 Contact with and (suspected) exposure to covid-19: Secondary | ICD-10-CM | POA: Diagnosis present

## 2022-04-01 DIAGNOSIS — Z66 Do not resuscitate: Secondary | ICD-10-CM | POA: Diagnosis present

## 2022-04-01 LAB — COMPREHENSIVE METABOLIC PANEL
ALT: 19 U/L (ref 0–44)
AST: 28 U/L (ref 15–41)
Albumin: 4.2 g/dL (ref 3.5–5.0)
Alkaline Phosphatase: 73 U/L (ref 38–126)
Anion gap: 8 (ref 5–15)
BUN: 18 mg/dL (ref 8–23)
CO2: 27 mmol/L (ref 22–32)
Calcium: 10 mg/dL (ref 8.9–10.3)
Chloride: 105 mmol/L (ref 98–111)
Creatinine, Ser: 0.94 mg/dL (ref 0.44–1.00)
GFR, Estimated: 58 mL/min — ABNORMAL LOW (ref >60)
Glucose, Bld: 109 mg/dL — ABNORMAL HIGH (ref 70–99)
Potassium: 3.6 mmol/L (ref 3.5–5.1)
Sodium: 140 mmol/L (ref 135–145)
Total Bilirubin: 0.8 mg/dL (ref 0.3–1.2)
Total Protein: 7.9 g/dL (ref 6.5–8.1)

## 2022-04-01 LAB — URINALYSIS, ROUTINE W REFLEX MICROSCOPIC
Bacteria, UA: NONE SEEN
Bilirubin Urine: NEGATIVE
Glucose, UA: NEGATIVE mg/dL
Hgb urine dipstick: NEGATIVE
Ketones, ur: NEGATIVE mg/dL
Nitrite: NEGATIVE
Protein, ur: NEGATIVE mg/dL
Specific Gravity, Urine: 1.017 (ref 1.005–1.030)
pH: 6 (ref 5.0–8.0)

## 2022-04-01 LAB — TROPONIN I (HIGH SENSITIVITY): Troponin I (High Sensitivity): 3 ng/L (ref <18)

## 2022-04-01 LAB — I-STAT CHEM 8, ED
BUN: 17 mg/dL (ref 8–23)
Calcium, Ion: 1.3 mmol/L (ref 1.15–1.40)
Chloride: 102 mmol/L (ref 98–111)
Creatinine, Ser: 0.9 mg/dL (ref 0.44–1.00)
Glucose, Bld: 105 mg/dL — ABNORMAL HIGH (ref 70–99)
HCT: 42 % (ref 36.0–46.0)
Hemoglobin: 14.3 g/dL (ref 12.0–15.0)
Potassium: 3.5 mmol/L (ref 3.5–5.1)
Sodium: 140 mmol/L (ref 135–145)
TCO2: 25 mmol/L (ref 22–32)

## 2022-04-01 LAB — CBC
HCT: 40.4 % (ref 36.0–46.0)
Hemoglobin: 13.5 g/dL (ref 12.0–15.0)
MCH: 32.2 pg (ref 26.0–34.0)
MCHC: 33.4 g/dL (ref 30.0–36.0)
MCV: 96.4 fL (ref 80.0–100.0)
Platelets: 200 10*3/uL (ref 150–400)
RBC: 4.19 MIL/uL (ref 3.87–5.11)
RDW: 14 % (ref 11.5–15.5)
WBC: 6.7 10*3/uL (ref 4.0–10.5)
nRBC: 0 % (ref 0.0–0.2)

## 2022-04-01 LAB — RESP PANEL BY RT-PCR (FLU A&B, COVID) ARPGX2
Influenza A by PCR: NEGATIVE
Influenza B by PCR: NEGATIVE
SARS Coronavirus 2 by RT PCR: NEGATIVE

## 2022-04-01 LAB — D-DIMER, QUANTITATIVE: D-Dimer, Quant: 1.09 ug/mL-FEU — ABNORMAL HIGH (ref 0.00–0.50)

## 2022-04-01 MED ORDER — IOHEXOL 350 MG/ML SOLN
80.0000 mL | Freq: Once | INTRAVENOUS | Status: AC | PRN
  Administered 2022-04-01: 80 mL via INTRAVENOUS

## 2022-04-01 MED ORDER — SODIUM CHLORIDE (PF) 0.9 % IJ SOLN
INTRAMUSCULAR | Status: AC
  Filled 2022-04-01: qty 50

## 2022-04-02 ENCOUNTER — Observation Stay (HOSPITAL_COMMUNITY): Payer: Medicare PPO

## 2022-04-02 ENCOUNTER — Emergency Department (HOSPITAL_COMMUNITY): Payer: Medicare PPO

## 2022-04-02 ENCOUNTER — Ambulatory Visit: Payer: Medicare PPO

## 2022-04-02 ENCOUNTER — Encounter (HOSPITAL_COMMUNITY): Payer: Self-pay | Admitting: Emergency Medicine

## 2022-04-02 ENCOUNTER — Inpatient Hospital Stay (HOSPITAL_COMMUNITY)
Admission: EM | Admit: 2022-04-02 | Discharge: 2022-04-05 | DRG: 554 | Disposition: A | Discharge status: Home Health | Payer: Medicare PPO | Attending: Internal Medicine | Admitting: Internal Medicine

## 2022-04-02 ENCOUNTER — Encounter (HOSPITAL_COMMUNITY): Payer: Medicare PPO

## 2022-04-02 ENCOUNTER — Telehealth: Payer: Self-pay

## 2022-04-02 ENCOUNTER — Other Ambulatory Visit: Payer: Self-pay

## 2022-04-02 DIAGNOSIS — W19XXXA Unspecified fall, initial encounter: Secondary | ICD-10-CM | POA: Diagnosis present

## 2022-04-02 DIAGNOSIS — I739 Peripheral vascular disease, unspecified: Secondary | ICD-10-CM

## 2022-04-02 DIAGNOSIS — M25569 Pain in unspecified knee: Secondary | ICD-10-CM | POA: Diagnosis present

## 2022-04-02 DIAGNOSIS — E785 Hyperlipidemia, unspecified: Secondary | ICD-10-CM | POA: Diagnosis present

## 2022-04-02 DIAGNOSIS — Z90710 Acquired absence of both cervix and uterus: Secondary | ICD-10-CM

## 2022-04-02 DIAGNOSIS — M25462 Effusion, left knee: Secondary | ICD-10-CM | POA: Diagnosis present

## 2022-04-02 DIAGNOSIS — Z20822 Contact with and (suspected) exposure to covid-19: Secondary | ICD-10-CM | POA: Diagnosis present

## 2022-04-02 DIAGNOSIS — E1151 Type 2 diabetes mellitus with diabetic peripheral angiopathy without gangrene: Secondary | ICD-10-CM | POA: Diagnosis present

## 2022-04-02 DIAGNOSIS — K219 Gastro-esophageal reflux disease without esophagitis: Secondary | ICD-10-CM | POA: Diagnosis present

## 2022-04-02 DIAGNOSIS — W19XXXD Unspecified fall, subsequent encounter: Secondary | ICD-10-CM

## 2022-04-02 DIAGNOSIS — M7122 Synovial cyst of popliteal space [Baker], left knee: Secondary | ICD-10-CM | POA: Diagnosis present

## 2022-04-02 DIAGNOSIS — E1169 Type 2 diabetes mellitus with other specified complication: Secondary | ICD-10-CM | POA: Diagnosis present

## 2022-04-02 DIAGNOSIS — Z6833 Body mass index (BMI) 33.0-33.9, adult: Secondary | ICD-10-CM

## 2022-04-02 DIAGNOSIS — Z86718 Personal history of other venous thrombosis and embolism: Secondary | ICD-10-CM

## 2022-04-02 DIAGNOSIS — Z79891 Long term (current) use of opiate analgesic: Secondary | ICD-10-CM

## 2022-04-02 DIAGNOSIS — M25562 Pain in left knee: Secondary | ICD-10-CM | POA: Diagnosis not present

## 2022-04-02 DIAGNOSIS — E669 Obesity, unspecified: Secondary | ICD-10-CM | POA: Diagnosis present

## 2022-04-02 DIAGNOSIS — I152 Hypertension secondary to endocrine disorders: Secondary | ICD-10-CM | POA: Diagnosis present

## 2022-04-02 DIAGNOSIS — K5900 Constipation, unspecified: Secondary | ICD-10-CM | POA: Diagnosis not present

## 2022-04-02 DIAGNOSIS — E876 Hypokalemia: Secondary | ICD-10-CM | POA: Diagnosis present

## 2022-04-02 DIAGNOSIS — Z885 Allergy status to narcotic agent status: Secondary | ICD-10-CM

## 2022-04-02 DIAGNOSIS — Z7983 Long term (current) use of bisphosphonates: Secondary | ICD-10-CM

## 2022-04-02 DIAGNOSIS — I10 Essential (primary) hypertension: Secondary | ICD-10-CM | POA: Diagnosis present

## 2022-04-02 DIAGNOSIS — Z66 Do not resuscitate: Secondary | ICD-10-CM | POA: Diagnosis present

## 2022-04-02 DIAGNOSIS — Z7982 Long term (current) use of aspirin: Secondary | ICD-10-CM

## 2022-04-02 DIAGNOSIS — Z8673 Personal history of transient ischemic attack (TIA), and cerebral infarction without residual deficits: Secondary | ICD-10-CM

## 2022-04-02 DIAGNOSIS — E118 Type 2 diabetes mellitus with unspecified complications: Secondary | ICD-10-CM | POA: Diagnosis present

## 2022-04-02 DIAGNOSIS — M1712 Unilateral primary osteoarthritis, left knee: Principal | ICD-10-CM | POA: Diagnosis present

## 2022-04-02 DIAGNOSIS — Z79899 Other long term (current) drug therapy: Secondary | ICD-10-CM

## 2022-04-02 LAB — CBC WITH DIFFERENTIAL/PLATELET
Abs Immature Granulocytes: 0.03 10*3/uL (ref 0.00–0.07)
Basophils Absolute: 0 10*3/uL (ref 0.0–0.1)
Basophils Relative: 0 %
Eosinophils Absolute: 0 10*3/uL (ref 0.0–0.5)
Eosinophils Relative: 0 %
HCT: 38.2 % (ref 36.0–46.0)
Hemoglobin: 12.8 g/dL (ref 12.0–15.0)
Immature Granulocytes: 0 %
Lymphocytes Relative: 7 %
Lymphs Abs: 0.7 10*3/uL (ref 0.7–4.0)
MCH: 31.9 pg (ref 26.0–34.0)
MCHC: 33.5 g/dL (ref 30.0–36.0)
MCV: 95.3 fL (ref 80.0–100.0)
Monocytes Absolute: 0.9 10*3/uL (ref 0.1–1.0)
Monocytes Relative: 9 %
Neutro Abs: 8.2 10*3/uL — ABNORMAL HIGH (ref 1.7–7.7)
Neutrophils Relative %: 84 %
Platelets: 189 10*3/uL (ref 150–400)
RBC: 4.01 MIL/uL (ref 3.87–5.11)
RDW: 14 % (ref 11.5–15.5)
WBC: 9.8 10*3/uL (ref 4.0–10.5)
nRBC: 0 % (ref 0.0–0.2)

## 2022-04-02 LAB — BASIC METABOLIC PANEL
Anion gap: 8 (ref 5–15)
BUN: 15 mg/dL (ref 8–23)
CO2: 27 mmol/L (ref 22–32)
Calcium: 9.6 mg/dL (ref 8.9–10.3)
Chloride: 102 mmol/L (ref 98–111)
Creatinine, Ser: 0.94 mg/dL (ref 0.44–1.00)
GFR, Estimated: 58 mL/min — ABNORMAL LOW (ref >60)
Glucose, Bld: 152 mg/dL — ABNORMAL HIGH (ref 70–99)
Potassium: 3.3 mmol/L — ABNORMAL LOW (ref 3.5–5.1)
Sodium: 137 mmol/L (ref 135–145)

## 2022-04-02 LAB — GLUCOSE, CAPILLARY
Glucose-Capillary: 95 mg/dL (ref 70–99)
Glucose-Capillary: 138 mg/dL — ABNORMAL HIGH (ref 70–99)

## 2022-04-02 LAB — MAGNESIUM: Magnesium: 2 mg/dL (ref 1.7–2.4)

## 2022-04-02 MED ORDER — KETOROLAC TROMETHAMINE 15 MG/ML IJ SOLN
15.0000 mg | Freq: Four times a day (QID) | INTRAMUSCULAR | Status: DC | PRN
  Administered 2022-04-03: 15 mg via INTRAVENOUS
  Filled 2022-04-02: qty 1

## 2022-04-02 MED ORDER — SIMVASTATIN 40 MG PO TABS
40.0000 mg | ORAL_TABLET | Freq: Every day | ORAL | Status: DC
  Administered 2022-04-02 – 2022-04-05 (×4): 40 mg via ORAL
  Filled 2022-04-02 (×4): qty 1

## 2022-04-02 MED ORDER — ACETAMINOPHEN 650 MG RE SUPP: 650.0000 mg | Freq: Four times a day (QID) | RECTAL | Status: DC | PRN

## 2022-04-02 MED ORDER — INSULIN ASPART 100 UNIT/ML IJ SOLN
0.0000 [IU] | Freq: Three times a day (TID) | INTRAMUSCULAR | Status: DC
  Administered 2022-04-03: 1 [IU] via SUBCUTANEOUS
  Administered 2022-04-03: 2 [IU] via SUBCUTANEOUS
  Administered 2022-04-04: 1 [IU] via SUBCUTANEOUS
  Administered 2022-04-04 – 2022-04-05 (×3): 2 [IU] via SUBCUTANEOUS

## 2022-04-02 MED ORDER — ACETAMINOPHEN 325 MG PO TABS: 650.0000 mg | ORAL_TABLET | Freq: Four times a day (QID) | ORAL | Status: DC | PRN

## 2022-04-02 MED ORDER — ONDANSETRON HCL 4 MG/2ML IJ SOLN
4.0000 mg | Freq: Once | INTRAMUSCULAR | Status: AC
  Administered 2022-04-02: 4 mg via INTRAVENOUS
  Filled 2022-04-02: qty 2

## 2022-04-02 MED ORDER — POTASSIUM CHLORIDE CRYS ER 10 MEQ PO TBCR
40.0000 meq | EXTENDED_RELEASE_TABLET | Freq: Every day | ORAL | Status: DC
  Administered 2022-04-02 – 2022-04-05 (×4): 40 meq via ORAL
  Filled 2022-04-02 (×4): qty 4

## 2022-04-02 MED ORDER — ONDANSETRON HCL 4 MG PO TABS: 4.0000 mg | ORAL_TABLET | Freq: Four times a day (QID) | ORAL | Status: DC | PRN

## 2022-04-02 MED ORDER — TRAMADOL HCL 50 MG PO TABS: 50.0000 mg | ORAL_TABLET | Freq: Four times a day (QID) | ORAL | 0 refills | Status: DC | PRN

## 2022-04-02 MED ORDER — CYCLOSPORINE 0.05 % OP EMUL
1.0000 [drp] | Freq: Two times a day (BID) | OPHTHALMIC | Status: DC
  Administered 2022-04-02 – 2022-04-05 (×6): 1 [drp] via OPHTHALMIC
  Filled 2022-04-02 (×6): qty 30

## 2022-04-02 MED ORDER — ASPIRIN 81 MG PO TBEC
81.0000 mg | DELAYED_RELEASE_TABLET | Freq: Every evening | ORAL | Status: DC
Start: 2022-04-02 — End: 2022-04-05
  Administered 2022-04-02 – 2022-04-04 (×3): 81 mg via ORAL
  Filled 2022-04-02 (×3): qty 1

## 2022-04-02 MED ORDER — PANTOPRAZOLE SODIUM 40 MG PO TBEC
40.0000 mg | DELAYED_RELEASE_TABLET | Freq: Every day | ORAL | Status: DC
  Administered 2022-04-02 – 2022-04-05 (×4): 40 mg via ORAL
  Filled 2022-04-02 (×4): qty 1

## 2022-04-02 MED ORDER — ORAL CARE MOUTH RINSE: 15.0000 mL | OROMUCOSAL | Status: DC | PRN

## 2022-04-02 MED ORDER — HYDROCODONE-ACETAMINOPHEN 5-325 MG PO TABS
1.0000 | ORAL_TABLET | ORAL | Status: DC | PRN
  Administered 2022-04-02 – 2022-04-04 (×4): 2 via ORAL
  Administered 2022-04-05: 1 via ORAL
  Filled 2022-04-02 (×4): qty 2
  Filled 2022-04-02: qty 1

## 2022-04-02 MED ORDER — ONDANSETRON HCL 4 MG/2ML IJ SOLN: 4.0000 mg | Freq: Four times a day (QID) | INTRAMUSCULAR | Status: DC | PRN

## 2022-04-02 MED ORDER — ENOXAPARIN SODIUM 40 MG/0.4ML IJ SOSY
40.0000 mg | PREFILLED_SYRINGE | INTRAMUSCULAR | Status: DC
  Administered 2022-04-02 – 2022-04-04 (×3): 40 mg via SUBCUTANEOUS
  Filled 2022-04-02 (×3): qty 0.4

## 2022-04-02 MED ORDER — MORPHINE SULFATE (PF) 2 MG/ML IV SOLN
2.0000 mg | Freq: Once | INTRAVENOUS | Status: AC
  Administered 2022-04-02: 2 mg via INTRAVENOUS
  Filled 2022-04-02: qty 1

## 2022-04-02 NOTE — Telephone Encounter (Signed)
I called pt. Per Pt. Ping report she was recently in the hospital for a fall with a lt. Knee injury. When I called the pts. Daughter was currently calling 911 to take her back to the hospital because she was in so much pain she could not move at all.

## 2022-04-03 ENCOUNTER — Inpatient Hospital Stay (HOSPITAL_COMMUNITY): Payer: Medicare PPO

## 2022-04-03 DIAGNOSIS — M7122 Synovial cyst of popliteal space [Baker], left knee: Secondary | ICD-10-CM | POA: Diagnosis present

## 2022-04-03 DIAGNOSIS — Z6833 Body mass index (BMI) 33.0-33.9, adult: Secondary | ICD-10-CM | POA: Diagnosis not present

## 2022-04-03 DIAGNOSIS — M25562 Pain in left knee: Secondary | ICD-10-CM | POA: Diagnosis present

## 2022-04-03 DIAGNOSIS — W19XXXD Unspecified fall, subsequent encounter: Secondary | ICD-10-CM

## 2022-04-03 DIAGNOSIS — E1151 Type 2 diabetes mellitus with diabetic peripheral angiopathy without gangrene: Secondary | ICD-10-CM | POA: Diagnosis present

## 2022-04-03 DIAGNOSIS — Z90710 Acquired absence of both cervix and uterus: Secondary | ICD-10-CM | POA: Diagnosis not present

## 2022-04-03 DIAGNOSIS — K5900 Constipation, unspecified: Secondary | ICD-10-CM | POA: Diagnosis not present

## 2022-04-03 DIAGNOSIS — Z79899 Other long term (current) drug therapy: Secondary | ICD-10-CM | POA: Diagnosis not present

## 2022-04-03 DIAGNOSIS — Z8673 Personal history of transient ischemic attack (TIA), and cerebral infarction without residual deficits: Secondary | ICD-10-CM | POA: Diagnosis not present

## 2022-04-03 DIAGNOSIS — Z7983 Long term (current) use of bisphosphonates: Secondary | ICD-10-CM | POA: Diagnosis not present

## 2022-04-03 DIAGNOSIS — M25569 Pain in unspecified knee: Secondary | ICD-10-CM | POA: Diagnosis present

## 2022-04-03 DIAGNOSIS — M25462 Effusion, left knee: Secondary | ICD-10-CM | POA: Diagnosis present

## 2022-04-03 DIAGNOSIS — I10 Essential (primary) hypertension: Secondary | ICD-10-CM | POA: Diagnosis present

## 2022-04-03 DIAGNOSIS — K219 Gastro-esophageal reflux disease without esophagitis: Secondary | ICD-10-CM | POA: Diagnosis present

## 2022-04-03 DIAGNOSIS — E785 Hyperlipidemia, unspecified: Secondary | ICD-10-CM | POA: Diagnosis present

## 2022-04-03 DIAGNOSIS — W19XXXA Unspecified fall, initial encounter: Secondary | ICD-10-CM | POA: Diagnosis present

## 2022-04-03 DIAGNOSIS — E669 Obesity, unspecified: Secondary | ICD-10-CM | POA: Diagnosis present

## 2022-04-03 DIAGNOSIS — E118 Type 2 diabetes mellitus with unspecified complications: Secondary | ICD-10-CM | POA: Diagnosis not present

## 2022-04-03 DIAGNOSIS — Z885 Allergy status to narcotic agent status: Secondary | ICD-10-CM | POA: Diagnosis not present

## 2022-04-03 DIAGNOSIS — Z86718 Personal history of other venous thrombosis and embolism: Secondary | ICD-10-CM | POA: Diagnosis not present

## 2022-04-03 DIAGNOSIS — Z7982 Long term (current) use of aspirin: Secondary | ICD-10-CM | POA: Diagnosis not present

## 2022-04-03 DIAGNOSIS — Z66 Do not resuscitate: Secondary | ICD-10-CM | POA: Diagnosis present

## 2022-04-03 DIAGNOSIS — M1712 Unilateral primary osteoarthritis, left knee: Secondary | ICD-10-CM | POA: Diagnosis present

## 2022-04-03 DIAGNOSIS — Z20822 Contact with and (suspected) exposure to covid-19: Secondary | ICD-10-CM | POA: Diagnosis present

## 2022-04-03 DIAGNOSIS — E1169 Type 2 diabetes mellitus with other specified complication: Secondary | ICD-10-CM | POA: Diagnosis present

## 2022-04-03 DIAGNOSIS — Z79891 Long term (current) use of opiate analgesic: Secondary | ICD-10-CM | POA: Diagnosis not present

## 2022-04-03 DIAGNOSIS — E876 Hypokalemia: Secondary | ICD-10-CM | POA: Diagnosis present

## 2022-04-03 LAB — COMPREHENSIVE METABOLIC PANEL
ALT: 18 U/L (ref 0–44)
AST: 20 U/L (ref 15–41)
Albumin: 3.5 g/dL (ref 3.5–5.0)
Alkaline Phosphatase: 50 U/L (ref 38–126)
Anion gap: 8 (ref 5–15)
BUN: 18 mg/dL (ref 8–23)
CO2: 26 mmol/L (ref 22–32)
Calcium: 9.6 mg/dL (ref 8.9–10.3)
Chloride: 106 mmol/L (ref 98–111)
Creatinine, Ser: 0.83 mg/dL (ref 0.44–1.00)
GFR, Estimated: >60 mL/min (ref >60)
Glucose, Bld: 134 mg/dL — ABNORMAL HIGH (ref 70–99)
Potassium: 3.9 mmol/L (ref 3.5–5.1)
Sodium: 140 mmol/L (ref 135–145)
Total Bilirubin: 1.2 mg/dL (ref 0.3–1.2)
Total Protein: 7.1 g/dL (ref 6.5–8.1)

## 2022-04-03 LAB — CBC
HCT: 35.2 % — ABNORMAL LOW (ref 36.0–46.0)
Hemoglobin: 11.5 g/dL — ABNORMAL LOW (ref 12.0–15.0)
MCH: 31.5 pg (ref 26.0–34.0)
MCHC: 32.7 g/dL (ref 30.0–36.0)
MCV: 96.4 fL (ref 80.0–100.0)
Platelets: 165 10*3/uL (ref 150–400)
RBC: 3.65 MIL/uL — ABNORMAL LOW (ref 3.87–5.11)
RDW: 14.1 % (ref 11.5–15.5)
WBC: 8 10*3/uL (ref 4.0–10.5)
nRBC: 0 % (ref 0.0–0.2)

## 2022-04-03 LAB — GLUCOSE, CAPILLARY
Glucose-Capillary: 174 mg/dL — ABNORMAL HIGH (ref 70–99)
Glucose-Capillary: 119 mg/dL — ABNORMAL HIGH (ref 70–99)
Glucose-Capillary: 123 mg/dL — ABNORMAL HIGH (ref 70–99)
Glucose-Capillary: 191 mg/dL — ABNORMAL HIGH (ref 70–99)

## 2022-04-03 LAB — HEMOGLOBIN A1C
Hgb A1c MFr Bld: 6.5 % — ABNORMAL HIGH (ref 4.8–5.6)
Mean Plasma Glucose: 140 mg/dL

## 2022-04-03 MED ORDER — KETOROLAC TROMETHAMINE 15 MG/ML IJ SOLN
7.5000 mg | Freq: Four times a day (QID) | INTRAMUSCULAR | Status: DC
  Administered 2022-04-03 – 2022-04-05 (×7): 7.5 mg via INTRAVENOUS
  Filled 2022-04-03 (×7): qty 1

## 2022-04-03 NOTE — Consult Note (Signed)
ORTHOPAEDIC CONSULTATION  REQUESTING PHYSICIAN: Kathie Dike, MD  Chief Complaint:  left knee pain, difficulty ambulating  HPI: Phyllis Campbell is a 88 y.o. female with  medical history significant for hypertension, hyperlipidemia, obesity, admitted to the hospital with worsening left knee pain and inability to walk. She had a fall on 03/31/22 after church. She was taken to [facility] on 04/01/22. Work-up in the ED showed no concerning findings and she was discharged home. Per ED note on that visit: "of hypertension, hyperlipidemia, obesity, admitted to the hospital with worsening left knee pain and inability to walk." She was discharged home. She returned to the ED on 04/02/22 for continued pain in the left knee and difficulty ambulating. She was admitted to the hospital. Orthopedics was consulted on 04/03/22 for helping managing the patient as all work-up has been negative and patient still complaining of significant pain.  Patient seen in 1335. She complains of left knee and thigh pain. She has not been able to move her left leg. She denies any previous history of left knee pain. She has had history of right knee pain and left hip pain but has just been told she has arthritis. She denies any new injury since this Saturday. No fever, chills, no recent illness, no history of gout. Her daughter is at bedside.   Past Medical History:  Diagnosis Date   Allergy    Arthritis    Cataracts, bilateral    Diabetes mellitus    DVT (deep venous thrombosis) (HCC)    Dyslipidemia    HH (hiatus hernia)    Hypertension    Obesity    Postmenopausal    Stroke ([place])    Thyromegaly    Past Surgical History:  Procedure Laterality Date   ABDOMINAL HYSTERECTOMY     cataracts     CESAREAN SECTION     COLONOSCOPY  2004   EYE SURGERY     SHOULDER SURGERY     Social History   Socioeconomic History   Marital status: Married    Spouse name: Not on file   Number of children: Not on file    Years of education: Not on file   Highest education level: Not on file  Occupational History   Not on file  Tobacco Use   Smoking status: Never   Smokeless tobacco: Never  Vaping Use   Vaping Use: Never used  Substance and Sexual Activity   Alcohol use: No   Drug use: No   Sexual activity: Not Currently  Other Topics Concern   Not on file  Social History Narrative   Not on file   Social Determinants of Health   Financial Resource Strain: Not on file  Food Insecurity: Not on file  Transportation Needs: Not on file  Physical Activity: Not on file  Stress: Not on file  Social Connections: Not on file   History reviewed. No pertinent family history. Allergies  Allergen Reactions   Tramadol Nausea And Vomiting   Prior to Admission medications   Medication Sig Start Date End Date Taking? Authorizing Provider  acetaminophen (TYLENOL) 650 MG CR tablet Take 1,300 mg by mouth 2 (two) times daily as needed for pain.   Yes [provider]  alendronate (FOSAMAX) 70 MG tablet TAKE 1 TABLET(70 MG) BY MOUTH EVERY 7 DAYS WITH A FULL GLASS OF WATER AND ON AN EMPTY STOMACH Patient taking differently: Take 70 mg by mouth every Sunday.  WITH A FULL GLASS OF WATER  AND ON AN EMPTY STOMACH 10/16/21  Yes Denita Lung, MD  aspirin EC 81 MG tablet Take 81 mg by mouth every evening.   Yes [provider]  Calcium Carb-Cholecalciferol (CALCIUM 600+D3 PO) Take 1 tablet by mouth daily.   Yes [provider]  Chlorpheniramine-DM (CORICIDIN HBP COUGH/COLD PO) Take 1 tablet by mouth every 8 (eight) hours as needed (for cold symptoms).   Yes [provider]  losartan-hydrochlorothiazide (HYZAAR) 50-12.5 MG tablet TAKE 1 TABLET EVERY DAY Patient taking differently: Take 1 tablet by mouth daily. 12/29/21  Yes Denita Lung, MD  meclizine (ANTIVERT) 12.5 MG tablet TAKE 1 TABLET BY MOUTH THREE TIMES DAILY AS NEEDED FOR DIZZINESS Patient taking differently: Take 12.5 mg by  mouth daily as needed for dizziness. 02/12/22  Yes Denita Lung, MD  Pseudoephedrine-Ibuprofen (ADVIL COLD/SINUS PO) Take 1 tablet by mouth every 8 (eight) hours as needed (for congestion).   Yes [provider]  RESTASIS 0.05 % ophthalmic emulsion Place 1 drop into both eyes as needed. Patient taking differently: Place 1 drop into both eyes in the morning and at bedtime. 12/08/19  Yes Denita Lung, MD  simvastatin (ZOCOR) 40 MG tablet TAKE 1 TABLET EVERY DAY Patient taking differently: Take 40 mg by mouth daily. 03/12/22  Yes Denita Lung, MD  Accu-Chek Softclix Lancets lancets 1 each by Other route daily. Use as instructed 02/04/20   Denita Lung, MD  glucose blood test strip 1 each by Other route as needed. Use as instructed 02/04/20   Denita Lung, MD  traMADol (ULTRAM) 50 MG tablet Take 1 tablet (50 mg total) by mouth every 6 (six) hours as needed. Patient not taking: Reported on 04/02/2022 04/02/22   Veryl Speak, MD   MR KNEE LEFT WO CONTRAST  Result Date: 04/03/2022 CLINICAL DATA:  Left knee pain after fall 2 days ago EXAM: MRI OF THE LEFT KNEE WITHOUT CONTRAST TECHNIQUE: Multiplanar, multisequence MR imaging of the knee was performed. No intravenous contrast was administered. COMPARISON:  X-ray and CT 04/01/2022 FINDINGS: MENISCI Medial meniscus: Intrasubstance degeneration with free edge and undersurface fraying of the medial meniscal body and posterior horn. Lateral meniscus:  Intrasubstance degeneration without focal tear. LIGAMENTS Cruciates: Intact ACL and PCL. Collaterals: Intact MCL. Thickening and increased intrasubstance signal within the proximal aspect of the fibular collateral ligament. The lateral collateral ligament complex is otherwise intact. CARTILAGE Patellofemoral: Extensive full-thickness cartilage loss throughout the patellofemoral compartment, more evident laterally. Medial: Moderate diffuse cartilage thinning of the weight-bearing medial compartment.  Lateral: Mild chondral thinning and surface irregularity of the weight-bearing lateral compartment. MISCELLANEOUS Joint: Moderate-sized knee joint effusion with small fluid-fluid level. Fat pads within normal limits. Popliteal Fossa: Moderate-sized Baker's cyst. Curvilinear 2.0 cm loose body within the Baker's cyst, likely free cartilage fragment. Tricompartmental joint space intact popliteus tendon. Extensor Mechanism:  Intact quadriceps and patellar tendons. Bones: No acute fracture. No dislocation. Narrowing with marginal osteophyte formation no bone marrow edema. No marrow replacing bone lesion. Other: No significant periarticular soft tissue findings. IMPRESSION: 1. No acute osseous abnormality of the left knee. 2. Tricompartmental osteoarthritis, most severe in the patellofemoral compartment. 3. Moderate-sized knee joint effusion and Baker's cyst. Curvilinear 2.0 cm loose body within the Baker's cyst, likely free cartilage fragment. 4. Intrasubstance degeneration of the medial and lateral menisci with free edge and undersurface fraying of the medial meniscal body and posterior horn. 5. Grade 1-2 sprain of the fibular collateral ligament, age indeterminate. Electronically Signed  By: Davina Poke D.O.   On: 04/03/2022 08:10   DG Hip Unilat W or Wo Pelvis 2-3 Views Left  Result Date: 04/02/2022 CLINICAL DATA:  Fall 2 days ago.  Pain. EXAM: DG HIP (WITH OR WITHOUT PELVIS) 2-3V LEFT COMPARISON:  Left hip radiographs 01/22/2020 FINDINGS: No acute fracture or hip dislocation is identified. Left hip joint space width is preserved. Contrast material is noted in the bladder. IMPRESSION: No acute osseous abnormality. Electronically Signed   By: Logan Bores M.D.   On: 04/02/2022 13:20   CT Angio Chest PE W and/or Wo Contrast  Result Date: 04/02/2022 CLINICAL DATA:  Pulmonary embolism (PE) suspected, positive D-dimer EXAM: CT ANGIOGRAPHY CHEST WITH CONTRAST TECHNIQUE: Multidetector CT imaging of the chest  was performed using the standard protocol during bolus administration of intravenous contrast. Multiplanar CT image reconstructions and MIPs were obtained to evaluate the vascular anatomy. RADIATION DOSE REDUCTION: This exam was performed according to the departmental dose-optimization program which includes automated exposure control, adjustment of the mA and/or kV according to patient size and/or use of iterative reconstruction technique. CONTRAST:  38m OMNIPAQUE IOHEXOL 350 MG/ML SOLN COMPARISON:  None Available. FINDINGS: Cardiovascular: Adequate opacification of the pulmonary arterial tree. No intraluminal filling defect identified to suggest acute pulmonary embolism. Central pulmonary arteries are of normal caliber. Mild coronary artery calcification. Cardiac size within normal limits. No pericardial effusion. No significant atherosclerotic calcification within the thoracic aorta. No aortic aneurysm. Mediastinum/Nodes: The thyroid gland is enlarged and demonstrates substernal extension. No discrete intraparenchymal nodules are identified. No pathologic thoracic adenopathy. Esophagus unremarkable. Lungs/Pleura: 5 mm noncalcified indeterminate pulmonary nodule, left upper lobe, axial image # 48/7. The lungs are otherwise clear. No pneumothorax or pleural effusion. Central airways are widely patent. Upper Abdomen: No acute abnormality. Musculoskeletal: No acute bone abnormality. No lytic or blastic bone lesions. Review of the MIP images confirms the above findings. IMPRESSION: 1. No pulmonary embolism. No acute intrathoracic pathology identified. 2. Mild coronary artery calcification. 3. 5 mm left solid pulmonary nodule within the upper lobe. If patient is low risk for malignancy, no routine follow-up imaging is recommended; if patient is high risk for malignancy, a non-contrast Chest CT at 12 months is optional. If performed and the nodule is stable at 12 months, no further follow-up is recommended. These  guidelines do not apply to immunocompromised patients and patients with cancer. Follow up in patients with significant comorbidities as clinically warranted. For lung cancer screening, adhere to Lung-RADS guidelines. Reference: Radiology. 2017; 284(1):228-43. 4. Thyromegaly with substernal extension. Electronically Signed   By: AFidela SalisburyM.D.   On: 04/02/2022 00:05   CT Knee Left Wo Contrast  Result Date: 04/01/2022 CLINICAL DATA:  Knee trauma, occult fracture suspected. EXAM: CT OF THE LEFT KNEE WITHOUT CONTRAST TECHNIQUE: Multidetector CT imaging of the left knee was performed according to the standard protocol. Multiplanar CT image reconstructions were also generated. RADIATION DOSE REDUCTION: This exam was performed according to the departmental dose-optimization program which includes automated exposure control, adjustment of the mA and/or kV according to patient size and/or use of iterative reconstruction technique. COMPARISON:  04/01/2022. FINDINGS: Bones/Joint/Cartilage No acute fracture or dislocation. Tricompartmental degenerative changes are noted, most pronounced in the patellofemoral compartment. A sclerotic region is noted in the distal femoral shaft and proximal tibia, likely representing bone infarcts or enchondromas. There is a small to moderate joint effusion. Enthesopathic changes are present at the patella. Ligaments Suboptimally assessed by CT. Muscles and Tendons No intramuscular hematoma or significant  edema. Soft tissues Vascular calcifications are noted in the soft tissues. Minimal subcutaneous edema or contusion is noted anterior to the patella. There is a fluid-filled structure in the patella fossa suggesting Baker's cyst. IMPRESSION: 1. No acute fracture or dislocation. 2. Tricompartmental degenerative changes. 3. Small to moderate joint effusion. 4. Baker's cyst. Electronically Signed   By: Brett Fairy M.D.   On: 04/01/2022 20:49   CT HEAD WO CONTRAST (5MM)  Result Date:  04/01/2022 CLINICAL DATA:  Fall yesterday with headaches and neck pain, initial encounter EXAM: CT HEAD WITHOUT CONTRAST CT CERVICAL SPINE WITHOUT CONTRAST TECHNIQUE: Multidetector CT imaging of the head and cervical spine was performed following the standard protocol without intravenous contrast. Multiplanar CT image reconstructions of the cervical spine were also generated. RADIATION DOSE REDUCTION: This exam was performed according to the departmental dose-optimization program which includes automated exposure control, adjustment of the mA and/or kV according to patient size and/or use of iterative reconstruction technique. COMPARISON:  12/25/2011 FINDINGS: CT HEAD FINDINGS Brain: No evidence of acute infarction, hemorrhage, hydrocephalus, extra-axial collection or mass lesion/mass effect. Basal ganglia calcifications are again identified. Falx calcifications are seen as well. Mild atrophic changes and chronic white matter ischemic changes are seen. Vascular: No hyperdense vessel or unexpected calcification. Skull: Normal. Negative for fracture or focal lesion. Sinuses/Orbits: No acute finding. Other: None. CT CERVICAL SPINE FINDINGS Alignment: Within normal limits. Skull base and vertebrae: Cervical segments are well visualized. Vertebral body height is well maintained. Osteophytic changes and disc space narrowing is noted from C5-C7. Multilevel facet hypertrophic changes are seen. No acute fracture or acute facet abnormality is noted. Old T1 spinous fracture is noted. Soft tissues and spinal canal: Surrounding soft tissue structures appear within normal limits. Upper chest: Visualized lung apices are unremarkable. Other: None IMPRESSION: CT of the head: Chronic atrophic and ischemic changes without acute abnormality. CT of the cervical spine: Multilevel degenerative change without acute abnormality. Electronically Signed   By: Inez Catalina M.D.   On: 04/01/2022 20:32   CT Cervical Spine Wo Contrast  Result  Date: 04/01/2022 CLINICAL DATA:  Fall yesterday with headaches and neck pain, initial encounter EXAM: CT HEAD WITHOUT CONTRAST CT CERVICAL SPINE WITHOUT CONTRAST TECHNIQUE: Multidetector CT imaging of the head and cervical spine was performed following the standard protocol without intravenous contrast. Multiplanar CT image reconstructions of the cervical spine were also generated. RADIATION DOSE REDUCTION: This exam was performed according to the departmental dose-optimization program which includes automated exposure control, adjustment of the mA and/or kV according to patient size and/or use of iterative reconstruction technique. COMPARISON:  12/25/2011 FINDINGS: CT HEAD FINDINGS Brain: No evidence of acute infarction, hemorrhage, hydrocephalus, extra-axial collection or mass lesion/mass effect. Basal ganglia calcifications are again identified. Falx calcifications are seen as well. Mild atrophic changes and chronic white matter ischemic changes are seen. Vascular: No hyperdense vessel or unexpected calcification. Skull: Normal. Negative for fracture or focal lesion. Sinuses/Orbits: No acute finding. Other: None. CT CERVICAL SPINE FINDINGS Alignment: Within normal limits. Skull base and vertebrae: Cervical segments are well visualized. Vertebral body height is well maintained. Osteophytic changes and disc space narrowing is noted from C5-C7. Multilevel facet hypertrophic changes are seen. No acute fracture or acute facet abnormality is noted. Old T1 spinous fracture is noted. Soft tissues and spinal canal: Surrounding soft tissue structures appear within normal limits. Upper chest: Visualized lung apices are unremarkable. Other: None IMPRESSION: CT of the head: Chronic atrophic and ischemic changes without acute abnormality. CT of the  cervical spine: Multilevel degenerative change without acute abnormality. Electronically Signed   By: Inez Catalina M.D.   On: 04/01/2022 20:32   DG Knee Left Port  Result Date:  04/01/2022 CLINICAL DATA:  Recent fall yesterday with left knee pain, initial encounter EXAM: PORTABLE LEFT KNEE - 1-2 VIEW COMPARISON:  None Available. FINDINGS: Tricompartmental degenerative changes are noted worst in the medial joint space. Small joint effusion is seen. No acute fracture or dislocation is noted. Changes suggestive of distal femoral infarct are noted. IMPRESSION: Stable degenerative changes.  No acute abnormality noted. Electronically Signed   By: Inez Catalina M.D.   On: 04/01/2022 19:24   DG Shoulder Right Port  Result Date: 04/01/2022 CLINICAL DATA:  Fall with right shoulder pain, initial encounter EXAM: RIGHT SHOULDER - 2 VIEW COMPARISON:  None Available. FINDINGS: Degenerative changes of the acromioclavicular and glenohumeral joints are seen. No acute fracture or dislocation is noted. Underlying bony thorax appears within normal limits. IMPRESSION: Degenerative change without acute abnormality Electronically Signed   By: Inez Catalina M.D.   On: 04/01/2022 19:23   Family History Reviewed and non-contributory, no pertinent history of problems with bleeding or anesthesia      Review of Systems 14 system ROS conducted and negative except for that noted in HPI   OBJECTIVE  Vitals:Patient Vitals for the past 8 hrs:  BP Temp Temp src Pulse Resp SpO2  04/03/22 1416 (!) 146/65 98.2 F (36.8 C) Oral 69 16 100 %   General: Alert, no acute distress Cardiovascular: Warm extremities noted Respiratory: No cyanosis, no use of accessory musculature GI: No organomegaly, abdomen is soft and non-tender Skin: No lesions in the area of chief complaint other than those listed below in MSK exam.  Neurologic: Sensation intact distally save for the below mentioned MSK exam Psychiatric: Patient is competent for consent with normal mood and affect Lymphatic: No swelling obvious and reported other than the area involved in the exam below  Extremities  Bilateral upper extremities: actively  moves upper extremities without pain. NVI  RLE: No swelling, deformity, or effusion. Skin intact. Nontender to palpation, with full and painless ROM throughout. + GS/TA/EHL. Sensation intact in DP/SP/S/S/P distributions. 2+ DP pulse with warm and well perfused digits. Compartments soft and compressible, with no pain on passive stretch.  LLE: she is nontender to palpation left hip. Some lateral thigh tenderness. Negative log roll. Difficult examination due to reported pain in the left knee. Obvious effusion of the left knee. No redness or warmth associated. Pain with ROM of the left knee. Only tolerates passive flexion to about 30 degrees. She is tender to palpation throughout the left knee but most pain is medial joint line. Difficult exam due to patient pain and cooperation. + GS/TA/EHL. Sensation intact in DP/SP/S/S/P distributions. 2+ DP pulse with warm and well perfused digits. Compartments soft and compressible, with no pain on passive stretch.    Test Results Imaging Xray of left knee negative any acute bony abnormality.   CT of left knee demonstrates tricompartmental osteoarthritis.   MRI of left knee demonstrates again tricompartmental osteoarthritis. Moderate joint effusion and Baker's cyst.   Xray of left hip negative for any acute bony abnormality.   CT of the left hip demonstrates degenerative disc disease. No evidence of acute left hip fracture.   Labs cbc Recent Labs    04/02/22 1151 04/03/22 0313  WBC 9.8 8.0  HGB 12.8 11.5*  HCT 38.2 35.2*  PLT 189 165    Labs  inflam No results for input(s): "CRP" in the last 72 hours.  Invalid input(s): "ESR"  Labs coag No results for input(s): "INR", "PTT" in the last 72 hours.  Invalid input(s): "PT"  Recent Labs    04/02/22 1151 04/03/22 0313  NA 137 140  K 3.3* 3.9  CL 102 106  CO2 27 26  GLUCOSE 152* 134*  BUN 15 18  CREATININE 0.94 0.83  CALCIUM 9.6 9.6     ASSESSMENT AND PLAN: 88 y.o. female with the  following: recent fall, left knee pain and effusion  Patient has been admitted for uncontrollable pain and limited ability to ambulate due to the pain in her left knee.   Her work-up from an orthopedic standpoint has not shown an acute bony abnormality. Patient does have a left knee effusion consistent with a recent fall in a patient with tricompartmental osteoarthritis. We will do an aspiration and injection today to help with her pain. See procedure note.    - Weight Bearing Status/Activity: WBAT  - Additional recommended labs/tests: PT/OT to work on ambulating.   -VTE Prophylaxis: per primary team  - Pain control: per primary team  - Follow-up plan: PRN  -Procedures: Aspiration of left knee and injection   No acute findings requiring any additional treatment at this time. Patient can follow-up with her PCP or orthopedics as needed.   Noemi Chapel, PA-C 04/03/22

## 2022-04-04 DIAGNOSIS — M25562 Pain in left knee: Secondary | ICD-10-CM | POA: Diagnosis not present

## 2022-04-04 LAB — GLUCOSE, CAPILLARY
Glucose-Capillary: 143 mg/dL — ABNORMAL HIGH (ref 70–99)
Glucose-Capillary: 166 mg/dL — ABNORMAL HIGH (ref 70–99)
Glucose-Capillary: 200 mg/dL — ABNORMAL HIGH (ref 70–99)
Glucose-Capillary: 167 mg/dL — ABNORMAL HIGH (ref 70–99)

## 2022-04-04 NOTE — Plan of Care (Signed)
  Problem: Pain Managment: Goal: General experience of comfort will improve Outcome: Progressing   

## 2022-04-04 NOTE — Procedures (Signed)
Procedure: Left knee aspiration and injection   Indication: Left knee effusion(s)   Provider: Alfonse Alpers, PA-C   Assist: None   Anesthesia: None   EBL: None   Complications: None   Findings: After risks/benefits explained patient desires to undergo procedure. Consent obtained and time out performed. The left knee was sterilely prepped and aspirated. Pt tolerated the procedure well.    PRE-PROCEDURE DIAGNOSIS:  Left knee effusion(s) POST-OPERATIVE DIAGNOSIS:  Same PROCEDURE:  Aspiration / Intra-articular injection left knee  PROCEDURE DETAILS:  After informed verbal consent was obtained the superolateral portal was prepped with chlorhexidine. Approximately 3 mL of lidocaine were injected superficially and then into the deeper tissues and into the joint space. Flash identified appropriate positioning and pressurized joint space. Medicine was left to sit for several minutes to take effect. Next, a 60 mL syringe with an 18-gauge needle was used to aspirate approximately 65ml of serosanguinous fluid.  4 ml of Marcaine and 1 ml of Depo-Medrol (40mg ) was then injected into the joint space. She tolerated this well without complication and a Band-Aid was placed.   , PA-C 04/04/2022 12:15 PM

## 2022-04-04 NOTE — Progress Notes (Signed)
PROGRESS NOTE Phyllis Campbell  [id number] DOB: June 03, 1932 DOA: 04/02/2022 PCP: Denita Lung, MD   Brief Narrative/Hospital Course: 89-yof w/ history of hypertension, hyperlipidemia, obesity, admitted to the hospital with worsening left knee pain and inability to walk. Her symptoms started after she had a fall.  Underwent further work-up.  Imaging including left knee MRI that shows knee joint effusion, osteoarthritis and is being managed with pain control PT OT.  She is s/p effusion aspiration and injection 6/27- and she feels better post procedure.  Subjective: Patient reports her pain is much better after the procedure, was noted to work with PT OT today. Post OT PT evaluation today does not feel ready for home yet.   Assessment and Plan: Principal Problem:   Left knee pain Active Problems:   Hypertension complicating diabetes ([place])   DM (diabetes mellitus) with complications (HCC)   Hyperlipidemia associated with type 2 diabetes mellitus (HCC)   Gastroesophageal reflux disease without esophagitis   Obesity (BMI 30-39.9)   Hypokalemia   PAD (peripheral artery disease) (HCC)   Knee pain   Left knee pain and effusion in the setting of osteoarthritis and fall at home with deconditioning: S/P effusion aspiration and injection 6/27- and she feels better post procedure.  Continue PT OT, pain control.  Will need outpatient follow-up with orthopedics.  On scheduled Toradol minimize narcotics monitor kidney function  Hypertension complicating diabetes: Well-controlled DM:Blood sugar controlled, continue sliding scale insulin. Hyperlipidemia associated with type 2 diabetes mellitus: Continue her statins GERD: Cont ppi Obesity class I BMI 33, will benefit with PCP follow-up weight loss healthy lifestyle. Hypokalemia: Resolved PAD: Continue aspirin and statin  DVT prophylaxis: enoxaparin (LOVENOX) injection 40 mg Start: 04/02/22 2200 Code Status:   Code Status: DNR Family  Communication: plan of care discussed with patient/daughter at bedside. Patient status is: Inpatient because of ongoing pain control Level of care: Telemetry   Dispo: The patient is from: home            Anticipated disposition: home likely tomorrow  Mobility Assessment (last 72 hours)     Mobility Assessment     Row Name 04/04/22 1013 04/04/22 0850 04/04/22 0815 04/03/22 0815 04/02/22 2142   Does patient have an order for bedrest or is patient medically unstable -- -- No - Continue assessment No - Continue assessment No - Continue assessment   What is the highest level of mobility based on the progressive mobility assessment? Level 5 (Walks with assist in room/hall) - Balance while stepping forward/back and can walk in room with assist - Complete Level 5 (Walks with assist in room/hall) - Balance while stepping forward/back and can walk in room with assist - Complete Level 5 (Walks with assist in room/hall) - Balance while stepping forward/back and can walk in room with assist - Complete Level 1 (Bedfast) - Unable to balance while sitting on edge of bed  due to leg pain Level 2 (Chairfast) - Balance while sitting on edge of bed and cannot stand   Is the above level different from baseline mobility prior to current illness? -- -- -- Yes - Recommend PT order Yes - Recommend PT order    Row Name 04/02/22 1628           Does patient have an order for bedrest or is patient medically unstable No - Continue assessment       What is the highest level of mobility based on the progressive mobility assessment? Level 2 (Chairfast) - Balance while  sitting on edge of bed and cannot stand       Is the above level different from baseline mobility prior to current illness? Yes - Recommend PT order                 Objective: Vitals last 24 hrs: Vitals:   04/03/22 1019 04/03/22 1416 04/03/22 2125 04/04/22 0519  BP: (!) 122/53 (!) 146/65 140/60 (!) 159/67  Pulse: 67 69 74 60  Resp: 18 16 17 16    Temp: 98.2 F (36.8 C) 98.2 F (36.8 C) 98.8 F (37.1 C) 97.9 F (36.6 C)  TempSrc:  Oral Oral Oral  SpO2: 100% 100% 96% 98%  Weight:      Height:       Weight change:   Physical Examination: General exam: alert awake,older than stated age, weak appearing. HEENT:Oral mucosa moist, Ear/Nose WNL grossly, dentition normal. Respiratory system: bilaterally diminished BS, no use of accessory muscle Cardiovascular system: S1 & S2 +, No JVD. Gastrointestinal system: Abdomen soft,NT,ND, BS+ Nervous System:Alert, awake, moving extremities and grossly nonfocal Extremities: LE edema neg, left knee with ice and dressing+,distal peripheral pulses palpable.  Skin: No rashes,no icterus. MSK: Normal muscle bulk,tone, power  Medications reviewed:  Scheduled Meds:  aspirin EC  81 mg Oral QPM   cycloSPORINE  1 drop Both Eyes BID   enoxaparin (LOVENOX) injection  40 mg Subcutaneous Q24H   insulin aspart  0-9 Units Subcutaneous TID WC   ketorolac  7.5 mg Intravenous Q6H   pantoprazole  40 mg Oral Daily   potassium chloride  40 mEq Oral Daily   simvastatin  40 mg Oral Daily   Continuous Infusions:    Diet Order             Diet Carb Modified Fluid consistency: Thin; Room service appropriate? Yes  Diet effective now                            Intake/Output Summary (Last 24 hours) at 04/04/2022 1259 Last data filed at 04/04/2022 0600 Gross per 24 hour  Intake 720 ml  Output 800 ml  Net -80 ml   Net IO Since Admission: 120 mL [04/04/22 1259]  Wt Readings from Last 3 Encounters:  04/02/22 89.8 kg  04/01/22 90.7 kg  10/16/21 91.1 kg     Unresulted Labs (From admission, onward)     Start     Ordered   04/09/22 0500  Creatinine, serum  (enoxaparin (LOVENOX)    CrCl >/= 30 ml/min)  Weekly,   R     Comments: while on enoxaparin therapy    04/02/22 1631          Data Reviewed: I have personally reviewed following labs and imaging studies CBC: Recent Labs  Lab  04/01/22 2007 04/01/22 2012 04/02/22 1151 04/03/22 0313  WBC  --  6.7 9.8 8.0  NEUTROABS  --   --  8.2*  --   HGB 14.3 13.5 12.8 11.5*  HCT 42.0 40.4 38.2 35.2*  MCV  --  96.4 95.3 96.4  PLT  --  200 189 165   Basic Metabolic Panel: Recent Labs  Lab 04/01/22 2007 04/01/22 2012 04/02/22 1151 04/02/22 1653 04/03/22 0313  NA 140 140 137  --  140  K 3.5 3.6 3.3*  --  3.9  CL 102 105 102  --  106  CO2  --  27 27  --  26  GLUCOSE 105*  109* 152*  --  134*  BUN 17 18 15   --  18  CREATININE 0.90 0.94 0.94  --  0.83  CALCIUM  --  10.0 9.6  --  9.6  MG  --   --   --  2.0  --    GFR: Estimated Creatinine Clearance: 50.8 mL/min (by C-G formula based on SCr of 0.83 mg/dL). Liver Function Tests: Recent Labs  Lab 04/01/22 2012 04/03/22 0313  AST 28 20  ALT 19 18  ALKPHOS 73 50  BILITOT 0.8 1.2  PROT 7.9 7.1  ALBUMIN 4.2 3.5   No results for input(s): "LIPASE", "AMYLASE" in the last 168 hours. No results for input(s): "AMMONIA" in the last 168 hours. Coagulation Profile: No results for input(s): "INR", "PROTIME" in the last 168 hours. BNP (last 3 results) No results for input(s): "PROBNP" in the last 8760 hours. HbA1C: Recent Labs    04/02/22 1653  HGBA1C 6.5*   CBG: Recent Labs  Lab 04/03/22 1121 04/03/22 1628 04/03/22 2126 04/04/22 0735 04/04/22 1120  GLUCAP 119* 123* 191* 143* 166*   Lipid Profile: No results for input(s): "CHOL", "HDL", "LDLCALC", "TRIG", "CHOLHDL", "LDLDIRECT" in the last 72 hours. Thyroid Function Tests: No results for input(s): "TSH", "T4TOTAL", "FREET4", "T3FREE", "THYROIDAB" in the last 72 hours. Sepsis Labs: No results for input(s): "PROCALCITON", "LATICACIDVEN" in the last 168 hours.  Recent Results (from the past 240 hour(s))  Resp Panel by RT-PCR (Flu A&B, Covid) Anterior Nasal Swab     Status: None   Collection Time: 04/01/22  7:55 PM   Specimen: Anterior Nasal Swab  Result Value Ref Range Status   SARS Coronavirus 2 by RT  PCR NEGATIVE NEGATIVE Final    Comment: (NOTE) SARS-CoV-2 target nucleic acids are NOT DETECTED.  The SARS-CoV-2 RNA is generally detectable in upper respiratory specimens during the acute phase of infection. The lowest concentration of SARS-CoV-2 viral copies this assay can detect is 138 copies/mL. A negative result does not preclude SARS-Cov-2 infection and should not be used as the sole basis for treatment or other patient management decisions. A negative result may occur with  improper specimen collection/handling, submission of specimen other than nasopharyngeal swab, presence of viral mutation(s) within the areas targeted by this assay, and inadequate number of viral copies(<138 copies/mL). A negative result must be combined with clinical observations, patient history, and epidemiological information. The expected result is Negative.  Fact Sheet for Patients:  04/03/22  Fact Sheet for Healthcare Providers:  BloggerCourse.com  This test is no t yet approved or cleared by the [employer] FDA and  has been authorized for detection and/or diagnosis of SARS-CoV-2 by FDA under an Emergency Use Authorization (EUA). This EUA will remain  in effect (meaning this test can be used) for the duration of the COVID-19 declaration under Section 564(b)(1) of the Act, 21 U.S.C.section 360bbb-3(b)(1), unless the authorization is terminated  or revoked sooner.       Influenza A by PCR NEGATIVE NEGATIVE Final   Influenza B by PCR NEGATIVE NEGATIVE Final    Comment: (NOTE) The Xpert Xpress SARS-CoV-2/FLU/RSV plus assay is intended as an aid in the diagnosis of influenza from Nasopharyngeal swab specimens and should not be used as a sole basis for treatment. Nasal washings and aspirates are unacceptable for Xpert Xpress SARS-CoV-2/FLU/RSV testing.  Fact Sheet for Patients: [place]  Fact Sheet for  Healthcare Providers: BloggerCourse.com  This test is not yet approved or cleared by the [employer] FDA and has  been authorized for detection and/or diagnosis of SARS-CoV-2 by FDA under an Emergency Use Authorization (EUA). This EUA will remain in effect (meaning this test can be used) for the duration of the COVID-19 declaration under Section 564(b)(1) of the Act, 21 U.S.C. section 360bbb-3(b)(1), unless the authorization is terminated or revoked.  Performed at [facility], [place] [address] [place], [place] [address]     Antimicrobials: Anti-infectives (From admission, onward)    None      Culture/Microbiology No results found for: "SDES", "SPECREQUEST", "CULT", "REPTSTATUS"  Other culture-see note  Radiology Studies: CT HIP LEFT WO CONTRAST  Result Date: 04/04/2022 CLINICAL DATA:  Hip trauma, fracture suspected, xray done Xrays negative, rule out occult fracture EXAM: CT OF THE LEFT HIP WITHOUT CONTRAST TECHNIQUE: Multidetector CT imaging of the left hip was performed according to the standard protocol. Multiplanar CT image reconstructions were also generated. RADIATION DOSE REDUCTION: This exam was performed according to the departmental dose-optimization program which includes automated exposure control, adjustment of the mA and/or kV according to patient size and/or use of iterative reconstruction technique. COMPARISON:  Hip radiograph 04/02/2022 FINDINGS: Bones/Joint/Cartilage There is no evidence of acute fracture.  Alignment is normal. There is mild left hip osteoarthritis. There is enthesophyte formation on the greater trochanter compatible with chronic gluteal tendinopathy. There is degenerative disc disease and facet arthropathy in the lower lumbar spine, with disc bulging, endplate spurring and facet arthropathy at L4-L5 and L5-S1 likely resulting in a degree of neural foraminal stenosis bilaterally. Ligaments Suboptimally assessed  by CT. Muscles and Tendons No intramuscular collection. No acute myotendinous abnormality by CT. Soft tissues No focal fluid collection. There is retained contrast material in the bladder. IMPRESSION: No evidence of acute left hip fracture. Mild left hip osteoarthritis. Electronically Signed   By: Maurine Simmering M.D.   On: 04/04/2022 08:10   MR KNEE LEFT WO CONTRAST  Result Date: 04/03/2022 CLINICAL DATA:  Left knee pain after fall 2 days ago EXAM: MRI OF THE LEFT KNEE WITHOUT CONTRAST TECHNIQUE: Multiplanar, multisequence MR imaging of the knee was performed. No intravenous contrast was administered. COMPARISON:  X-ray and CT 04/01/2022 FINDINGS: MENISCI Medial meniscus: Intrasubstance degeneration with free edge and undersurface fraying of the medial meniscal body and posterior horn. Lateral meniscus:  Intrasubstance degeneration without focal tear. LIGAMENTS Cruciates: Intact ACL and PCL. Collaterals: Intact MCL. Thickening and increased intrasubstance signal within the proximal aspect of the fibular collateral ligament. The lateral collateral ligament complex is otherwise intact. CARTILAGE Patellofemoral: Extensive full-thickness cartilage loss throughout the patellofemoral compartment, more evident laterally. Medial: Moderate diffuse cartilage thinning of the weight-bearing medial compartment. Lateral: Mild chondral thinning and surface irregularity of the weight-bearing lateral compartment. MISCELLANEOUS Joint: Moderate-sized knee joint effusion with small fluid-fluid level. Fat pads within normal limits. Popliteal Fossa: Moderate-sized Baker's cyst. Curvilinear 2.0 cm loose body within the Baker's cyst, likely free cartilage fragment. Tricompartmental joint space intact popliteus tendon. Extensor Mechanism:  Intact quadriceps and patellar tendons. Bones: No acute fracture. No dislocation. Narrowing with marginal osteophyte formation no bone marrow edema. No marrow replacing bone lesion. Other: No significant  periarticular soft tissue findings. IMPRESSION: 1. No acute osseous abnormality of the left knee. 2. Tricompartmental osteoarthritis, most severe in the patellofemoral compartment. 3. Moderate-sized knee joint effusion and Baker's cyst. Curvilinear 2.0 cm loose body within the Baker's cyst, likely free cartilage fragment. 4. Intrasubstance degeneration of the medial and lateral menisci with free edge and undersurface fraying of the medial meniscal body and posterior  horn. 5. Grade 1-2 sprain of the fibular collateral ligament, age indeterminate. Electronically Signed   By: Davina Poke D.O.   On: 04/03/2022 08:10     LOS: 1 day   Antonieta Pert, MD [facility]  04/04/2022, 12:59 PM

## 2022-04-04 NOTE — Hospital Course (Addendum)
89-yof w/ history of hypertension, hyperlipidemia, obesity, admitted to the hospital with worsening left knee pain and inability to walk. Her symptoms started after she had a fall.  Underwent further work-up.  Imaging including left knee MRI that shows knee joint effusion, osteoarthritis and is being managed with pain control PT OT.

## 2022-04-04 NOTE — Evaluation (Signed)
Occupational Therapy Evaluation Patient Details Name: Phyllis Campbell MRN: [id number] DOB: Aug 08, 1932 Today's Date: 04/04/2022   History of Present Illness Patient is 88 y.o. female who presented to ED on   6/24 fter fall outside of church. Pt discharged home as workup was negative for acute injury. Pt returned 6/26 with worsening Lt knee pain and inability to walk. Imaging indicates Lt knee joint effusion, no osseous abnormality on CT ro radiograph. PMH significant for OA, HTN, hyperlipidemia, obesity, hypokalemia, PAD, DM., stroke.   Clinical Impression   Patient is a 88 year old female who was admitted for above. Patient was noted to have increased pain in L knee impacting participation in ADLs. Patient was noted to be min guard with transfers from the edge of reclienr to commode with cues for safety with toileting tasks. Patient would continue to benefit from skilled OT services at this time while admitted to address noted deficits in order to improve overall safety and independence in ADLs.          Recommendations for follow up therapy are one component of a multi-disciplinary discharge planning process, led by the attending physician.  Recommendations may be updated based on patient status, additional functional criteria and insurance authorization.   Follow Up Recommendations  No OT follow up    Assistance Recommended at Discharge Frequent or constant Supervision/Assistance  Patient can return home with the following A little help with walking and/or transfers;A little help with bathing/dressing/bathroom;Assistance with cooking/housework;Assist for transportation;Direct supervision/assist for financial management;Help with stairs or ramp for entrance;Direct supervision/assist for medications management    Functional Status Assessment  Patient has had a recent decline in their functional status and demonstrates the ability to make significant improvements in function in a reasonable  and predictable amount of time.  Equipment Recommendations  Other (comment) (total hip kit)    Recommendations for Other Services       Precautions / Restrictions Precautions Precautions: Fall Restrictions Weight Bearing Restrictions: No      Mobility Bed Mobility               General bed mobility comments: patient was up in recliner and returned to the same    Transfers                          Balance Overall balance assessment: Needs assistance Sitting-balance support: Bilateral upper extremity supported, Feet supported Sitting balance-Leahy Scale: Fair     Standing balance support: Single extremity supported, During functional activity Standing balance-Leahy Scale: Fair                             ADL either performed or assessed with clinical judgement   ADL Overall ADL's : Needs assistance/impaired Eating/Feeding: Modified independent;Sitting   Grooming: Wash/dry hands;Standing;Min guard Grooming Details (indicate cue type and reason): cues for poistioning Upper Body Bathing: Set up;Sitting   Lower Body Bathing: Min guard;Sitting/lateral leans;Sit to/from stand Lower Body Bathing Details (indicate cue type and reason): with education on reacher use and long handled sponge Upper Body Dressing : Set up;Sitting   Lower Body Dressing: Min guard;Sitting/lateral leans;Sit to/from stand Lower Body Dressing Details (indicate cue type and reason): to don undergarmtnets with education on usign reacher to complete task. patient was able to don/doff socks with reacher and scok aid with increased time. Toilet Transfer: Agricultural engineer (2 wheels) Toilet Transfer Details (indicate cue type and reason):  with increased time and safety cues Toileting- Clothing Manipulation and Hygiene: Min guard Toileting - Clothing Manipulation Details (indicate cue type and reason): education on putting undergarment liner in pants seated prior to standing  to prevent risks of LOB. patient and daughter verbalized understanding.     Functional mobility during ADLs: Min guard;Rolling walker (2 wheels)       Vision Patient Visual Report: No change from baseline       Perception     Praxis      Pertinent Vitals/Pain Pain Assessment Pain Assessment: 0-10 Pain Score: 7  Pain Location: Lt knee Pain Descriptors / Indicators: Throbbing, Tender Pain Intervention(s): Limited activity within patient's tolerance, Monitored during session, Premedicated before session, Repositioned     Hand Dominance Right   Extremity/Trunk Assessment Upper Extremity Assessment Upper Extremity Assessment: Overall WFL for tasks assessed   Lower Extremity Assessment Lower Extremity Assessment: Defer to PT evaluation LLE Deficits / Details: Pt reports Lt knee pain but is able to bare weight during ambulation. Bandage wrapped around knee.   Cervical / Trunk Assessment Cervical / Trunk Assessment: Kyphotic   Communication Communication Communication: No difficulties   Cognition Arousal/Alertness: Awake/alert Behavior During Therapy: WFL for tasks assessed/performed Overall Cognitive Status: Within Functional Limits for tasks assessed                                 General Comments: daughter was present in room as well     General Comments       Exercises     Shoulder Instructions      Home Living Family/patient expects to be discharged to:: Private residence Living Arrangements: Children Available Help at Discharge: Family Type of Home: House Home Access: Stairs to enter Technical brewer of Steps: 7 Entrance Stairs-Rails: Can reach both Home Layout: One level     Bathroom Shower/Tub: Teacher, early years/pre: Standard Bathroom Accessibility: Yes   Home Equipment: Conservation officer, nature (2 wheels);Rollator (4 wheels)   Additional Comments: Back entrance-where pt normally enters- has 4 steps with railing on Rt  side.      Prior Functioning/Environment Prior Level of Function : Needs assist  Cognitive Assist : Mobility (cognitive);ADLs (cognitive)     Physical Assist : Mobility (physical) Mobility (physical): Transfers;Gait   Mobility Comments: Pt's children drive her and help her at the grocery store with AD. ADLs Comments: patient needed some safety cues at times per daughter report.        OT Problem List: Decreased activity tolerance;Impaired balance (sitting and/or standing);Decreased safety awareness;Cardiopulmonary status limiting activity;Decreased knowledge of precautions;Decreased knowledge of use of DME or AE      OT Treatment/Interventions: Self-care/ADL training;Therapeutic exercise;Neuromuscular education;Energy conservation;DME and/or AE instruction;Therapeutic activities;Balance training;Patient/family education    OT Goals(Current goals can be found in the care plan section) Acute Rehab OT Goals Patient Stated Goal: to get better OT Goal Formulation: With patient/family Time For Goal Achievement: 04/18/22 Potential to Achieve Goals: Fair  OT Frequency: Min 1X/week    Co-evaluation              AM-PAC OT "6 Clicks" Daily Activity     Outcome Measure Help from another person eating meals?: None Help from another person taking care of personal grooming?: A Little Help from another person toileting, which includes using toliet, bedpan, or urinal?: A Little Help from another person bathing (including washing, rinsing, drying)?: A Little Help from another person  to put on and taking off regular upper body clothing?: None Help from another person to put on and taking off regular lower body clothing?: A Little 6 Click Score: 20   End of Session Equipment Utilized During Treatment: Gait belt;Rolling walker (2 wheels);Other (comment) (total hip kit) Nurse Communication: Other (comment) (ok to participate)  Activity Tolerance: Patient tolerated treatment well Patient  left: in chair;with call bell/phone within reach  OT Visit Diagnosis: Unsteadiness on feet (R26.81);Muscle weakness (generalized) (M62.81);Pain Pain - Right/Left: Left Pain - part of body: Knee                Time: 0925-0958 OT Time Calculation (min): 33 min Charges:  OT General Charges $OT Visit: 1 Visit OT Evaluation $OT Eval Low Complexity: 1 Low OT Treatments $Self Care/Home Management : 8-22 mins  Jackelyn Poling OTR/L, MS Acute Rehabilitation Department Office# [phone] Pager# [phone]   Marcellina Millin 04/04/2022, 10:15 AM

## 2022-04-04 NOTE — Evaluation (Signed)
Physical Therapy Evaluation Patient Details Name: Phyllis Campbell MRN: [id number] DOB: 24-Aug-1932 Today's Date: 04/04/2022  History of Present Illness  Patient is 88 y.o. female who presented to ED on   6/24 fter fall outside of church. Pt discharged home as workup was negative for acute injury. Pt returned 6/26 with worsening Lt knee pain and inability to walk. Imaging indicates Lt knee joint effusion, no osseous abnormality on CT ro radiograph. PMH significant for OA, HTN, hyperlipidemia, obesity, hypokalemia, PAD, DM., stroke.   Clinical Impression  Pt presents to ED from home after falling, demonstrating mobility and balance deficits due to above HPI. PLOF pt able to grocery shop with AD and children assisting. Pt lives with daughter in one level house with overall family support when assistance needed. Pt's daughter reports using Rolator at home when necessary. Currently pt requires Min assist/Min guard for bed mobility and gait, Mod assist for transfers. Pt c/o Lt knee pain that mostly resolved after aspiration procedure one day prior. Pt is limited by pain, fear of falling, and mild LOB deficits. Pt appears safe to return home, however recommending continued skilled therapy with HHPT to address pt's above impairments. Will progress as able.   Vitals: pt c/o dizziness with gait, VSS after sitting at EOS            HR = 79 bpm    BP = 151/69 mmHg     Recommendations for follow up therapy are one component of a multi-disciplinary discharge planning process, led by the attending physician.  Recommendations may be updated based on patient status, additional functional criteria and insurance authorization.  Follow Up Recommendations Home health PT      Assistance Recommended at Discharge Intermittent Supervision/Assistance  Patient can return home with the following  A little help with walking and/or transfers;A little help with bathing/dressing/bathroom;Assistance with  cooking/housework;Direct supervision/assist for medications management;Direct supervision/assist for financial management;Assist for transportation;Help with stairs or ramp for entrance    Equipment Recommendations None recommended by PT  Recommendations for Other Services       Functional Status Assessment Patient has had a recent decline in their functional status and demonstrates the ability to make significant improvements in function in a reasonable and predictable amount of time.     Precautions / Restrictions Precautions Precautions: Fall Restrictions Weight Bearing Restrictions: No      Mobility  Bed Mobility Overal bed mobility: Needs Assistance Bed Mobility: Supine to Sit     Supine to sit: Min assist     General bed mobility comments: Pt relied on VC's for hand placement with trunk and leg lift to EOB. Required therapist assist with trunk upright transition to EOB.    Transfers Overall transfer level: Needs assistance Equipment used: Standard walker Transfers: Sit to/from Stand Sit to Stand: Mod assist           General transfer comment: Pt required physical lift at trunk for power up, mod assist. Pt relied on VC's for hand placement with arm push off from bed for power up and for replacing hands on walker upon standing. Therapist stabilized RW during standing.    Ambulation/Gait Ambulation/Gait assistance: Min guard Gait Distance (Feet): 170 Feet Assistive device: Rolling walker (2 wheels) Gait Pattern/deviations: Step-through pattern, Decreased stride length, Antalgic, Trunk flexed Gait velocity: decr     General Gait Details: Pt demonstrated inconsistent speed for gait,  slow v fast pace fluctuated throughout ambulation. Pt reported dizziness after turning 180 deg with RW upon returning  back to room. Dizziness did not seem to resolve.  Stairs            Wheelchair Mobility    Modified Rankin (Stroke Patients Only)       Balance Overall  balance assessment: Needs assistance Sitting-balance support: Bilateral upper extremity supported, Feet supported Sitting balance-Leahy Scale: Fair Sitting balance - Comments: Pt sat at EOB while setting up her RW and gait belt.   Standing balance support: Bilateral upper extremity supported, During functional activity, Reliant on assistive device for balance Standing balance-Leahy Scale: Fair Standing balance comment: Pt relied on Bil UE support with RW to maintain standing balance and able to look straight ahead with no LOB.                             Pertinent Vitals/Pain Pain Assessment Pain Assessment: 0-10 Pain Score: 7  Pain Location: Lt knee Pain Descriptors / Indicators: Throbbing, Tender Pain Intervention(s): Monitored during session    Home Living Family/patient expects to be discharged to:: Private residence Living Arrangements: Children Available Help at Discharge: Family Type of Home: House Home Access: Stairs to enter Entrance Stairs-Rails:  (both) Secretary/administrator of Steps: 7   Home Layout: One level Home Equipment: Agricultural consultant (2 wheels);Rollator (4 wheels) Additional Comments: Back entrance-where pt normally enters- has 4 steps with railing on Rt side.    Prior Function Prior Level of Function : Needs assist  Cognitive Assist : Mobility (cognitive)     Physical Assist : Mobility (physical) Mobility (physical): Transfers;Gait   Mobility Comments: Pt's children drive her and help her at the grocery store with AD.       Hand Dominance   Dominant Hand: Right    Extremity/Trunk Assessment   Upper Extremity Assessment Upper Extremity Assessment: Defer to OT evaluation    Lower Extremity Assessment Lower Extremity Assessment: LLE deficits/detail LLE Deficits / Details: Pt reports Lt knee pain but is able to bare weight during ambulation. Bandage wrapped around knee.    Cervical / Trunk Assessment Cervical / Trunk Assessment:  Kyphotic  Communication   Communication: No difficulties  Cognition Arousal/Alertness: Awake/alert Behavior During Therapy: WFL for tasks assessed/performed Overall Cognitive Status: Within Functional Limits for tasks assessed                                 General Comments: Pt very pleasant and compliant with therapy.        General Comments      Exercises     Assessment/Plan    PT Assessment Patient needs continued PT services  PT Problem List Decreased activity tolerance;Decreased balance;Decreased mobility;Decreased safety awareness;Pain       PT Treatment Interventions DME instruction;Gait training;Stair training;Functional mobility training;Therapeutic activities;Balance training;Patient/family education    PT Goals (Current goals can be found in the Care Plan section)  Acute Rehab PT Goals Patient Stated Goal: To go home PT Goal Formulation: With patient/family Time For Goal Achievement: 04/18/22 Potential to Achieve Goals: Good    Frequency Min 3X/week     Co-evaluation               AM-PAC PT "6 Clicks" Mobility  Outcome Measure Help needed turning from your back to your side while in a flat bed without using bedrails?: A Little Help needed moving from lying on your back to sitting on the side of a flat bed  without using bedrails?: A Little Help needed moving to and from a bed to a chair (including a wheelchair)?: A Little Help needed standing up from a chair using your arms (e.g., wheelchair or bedside chair)?: A Lot Help needed to walk in hospital room?: A Little Help needed climbing 3-5 steps with a railing? : A Little 6 Click Score: 17    End of Session Equipment Utilized During Treatment: Gait belt Activity Tolerance: Patient tolerated treatment well Patient left: in chair;with call bell/phone within reach;with family/visitor present Nurse Communication: Mobility status PT Visit Diagnosis: Unsteadiness on feet (R26.81);Other  abnormalities of gait and mobility (R26.89);Pain;Repeated falls (R29.6) Pain - Right/Left: Left Pain - part of body: Knee    Time:  -      Charges:             Lenn Cal, SPT Acute Rehab [facility] 04/04/2022, 9:53 AM

## 2022-04-04 NOTE — TOC Initial Note (Signed)
Transition of Care [facility]) - Initial/Assessment Note   Patient Details  Name: Phyllis Campbell MRN: [id number] Date of Birth: 1931-12-13  [facility]) CM/SW Contact:    Ewing Schlein, LCSW Phone Number: 04/04/2022, 2:13 PM  Clinical Narrative: PT evaluation recommended HHPT. Patient is agreeable to HHPT referral to agencies in-network with her insurance. CSW made [facility] referral to Cindie with [facility], which was accepted. CSW updated patient. HH orders are in.  Expected Discharge Plan: Home w Home Health Services Barriers to Discharge: Continued Medical Work up  Patient Goals and CMS Choice Patient states their goals for this hospitalization and ongoing recovery are:: Return home with PT CMS Medicare.gov Compare Post Acute Care list provided to:: Patient Choice offered to / list presented to : Patient  Expected Discharge Plan and Services Expected Discharge Plan: Home w Home Health Services In-house Referral: Clinical Social Work Post Acute Care Choice: Home Health Living arrangements for the past 2 months: Single Family Home             DME Arranged: N/A DME Agency: NA HH Arranged: PT HH AgencyHotel manager Home Health Care Date [facility] Agency Contacted: 04/04/22 Time HH Agency Contacted: 1355 Representative spoke with at [facility] Agency: Cindie  Prior Living Arrangements/Services Living arrangements for the past 2 months: Single Family Home Patient language and need for interpreter reviewed:: Yes Do you feel safe going back to the place where you live?: Yes      Need for Family Participation in Patient Care: No (Comment) Care giver support system in place?: Yes (comment) Criminal Activity/Legal Involvement Pertinent to Current Situation/Hospitalization: No - Comment as needed  Activities of Daily Living Home Assistive Devices/Equipment: Walker (specify type), Cane (specify quad or straight), Bedside commode/3-in-1, Shower chair with back, Eyeglasses, Dentures (specify type) (full set  dentures) ADL Screening (condition at time of admission) Patient's cognitive ability adequate to safely complete daily activities?: Yes Is the patient deaf or have difficulty hearing?: No Does the patient have difficulty seeing, even when wearing glasses/contacts?: Yes (at times) Does the patient have difficulty concentrating, remembering, or making decisions?: No Patient able to express need for assistance with ADLs?: Yes Does the patient have difficulty dressing or bathing?: No Independently performs ADLs?: No Communication: Independent Dressing (OT): Needs assistance Is this a change from baseline?: Change from baseline, expected to last >3 days Grooming: Independent Feeding: Independent Bathing: Needs assistance Is this a change from baseline?: Change from baseline, expected to last >3 days Toileting: Needs assistance Is this a change from baseline?: Change from baseline, expected to last >3days In/Out Bed: Needs assistance Is this a change from baseline?: Change from baseline, expected to last >3 days Walks in Home: Dependent Is this a change from baseline?: Change from baseline, expected to last >3 days Does the patient have difficulty walking or climbing stairs?: Yes Weakness of Legs: Left (hurt left knee) Weakness of Arms/Hands: Right (bad rotator cuff in right arm)  Permission Sought/Granted Permission sought to share information with : Other (comment) Permission granted to share information with : Yes, Verbal Permission Granted Permission granted to share info w AGENCY: HH agencies  Emotional Assessment Appearance:: Appears stated age Attitude/Demeanor/Rapport: Engaged Affect (typically observed): Accepting Orientation: : Oriented to Self, Oriented to Place, Oriented to  Time, Oriented to Situation Alcohol / Substance Use: Not Applicable Psych Involvement: No (comment)  Admission diagnosis:  Left knee pain [M25.562] Fall, subsequent encounter [W19.XXXD] Acute pain  of left knee [M25.562] Knee pain [M25.569] Patient Active Problem  List   Diagnosis Date Noted   Knee pain 04/03/2022   Left knee pain 04/02/2022   Hypokalemia 04/02/2022   PAD (peripheral artery disease) (HCC) 04/02/2022   Gastroesophageal reflux disease without esophagitis 05/17/2020   Obesity (BMI 30-39.9) 10/13/2019   Degenerative lumbar spinal stenosis 10/13/2019   Osteoporosis 09/19/2018   Lumbar radiculopathy 06/03/2018   Atherosclerosis of native arteries of extremity with intermittent claudication (HCC) 03/13/2012   Hypertension complicating diabetes (HCC) 07/09/2011   Allergic rhinitis due to pollen 07/09/2011   DM (diabetes mellitus) with complications (HCC) 07/09/2011   Hyperlipidemia associated with type 2 diabetes mellitus (HCC) 07/09/2011   Arthritis 07/09/2011   PCP:  Ronnald Nian, MD Pharmacy:   [facility] [phone] - , [place] - [address] AT [facility] [address] & [address] [place] [address] Phone: [phone] Fax: [phone]  [facility] [address] [place] [address] Phone: [phone] Fax: [phone]  Readmission Risk Interventions     No data to display

## 2022-04-05 DIAGNOSIS — M25562 Pain in left knee: Secondary | ICD-10-CM | POA: Diagnosis not present

## 2022-04-05 LAB — GLUCOSE, CAPILLARY
Glucose-Capillary: 170 mg/dL — ABNORMAL HIGH (ref 70–99)
Glucose-Capillary: 141 mg/dL — ABNORMAL HIGH (ref 70–99)

## 2022-04-05 LAB — BASIC METABOLIC PANEL
Anion gap: 5 (ref 5–15)
BUN: 23 mg/dL (ref 8–23)
CO2: 23 mmol/L (ref 22–32)
Calcium: 9 mg/dL (ref 8.9–10.3)
Chloride: 112 mmol/L — ABNORMAL HIGH (ref 98–111)
Creatinine, Ser: 0.7 mg/dL (ref 0.44–1.00)
GFR, Estimated: >60 mL/min (ref >60)
Glucose, Bld: 184 mg/dL — ABNORMAL HIGH (ref 70–99)
Potassium: 4.6 mmol/L (ref 3.5–5.1)
Sodium: 140 mmol/L (ref 135–145)

## 2022-04-05 MED ORDER — POLYETHYLENE GLYCOL 3350 17 G PO PACK: 17.0000 g | PACK | Freq: Every day | ORAL | 0 refills | Status: AC

## 2022-04-05 MED ORDER — BISACODYL 10 MG RE SUPP
10.0000 mg | Freq: Once | RECTAL | Status: AC
Start: 2022-04-05 — End: 2022-04-05
  Administered 2022-04-05: 10 mg via RECTAL
  Filled 2022-04-05: qty 1

## 2022-04-05 MED ORDER — BISACODYL 5 MG PO TBEC: 5.0000 mg | DELAYED_RELEASE_TABLET | Freq: Every day | ORAL | 1 refills | Status: AC | PRN

## 2022-04-05 MED ORDER — POLYETHYLENE GLYCOL 3350 17 G PO PACK
17.0000 g | PACK | Freq: Every day | ORAL | Status: DC
  Administered 2022-04-05: 17 g via ORAL
  Filled 2022-04-05: qty 1

## 2022-04-05 NOTE — Progress Notes (Signed)
Patient discharged to home with family. Given all belongings, instructions, equipment. Verbalized understanding of all instructions. Escorted to pov via w/c.  °

## 2022-04-05 NOTE — TOC Transition Note (Signed)
Transition of Care [facility]) - CM/SW Discharge Note  Patient Details  Name: Phyllis Campbell MRN: [id number] Date of Birth: 1932-03-11  Transition of Care [facility]) CM/SW Contact:  Ewing Schlein, LCSW Phone Number: 04/05/2022, 12:22 PM  Clinical Narrative: CSW notified patient wants a 3N1. Patient is aware it will be private pay and is agreeable to referral to [employer]. CSW made DME referral to Danielle with [employer]. Order is in. [employer] to deliver 3N1 to patient's room. TOC signing off.  Final next level of care: Home w Home Health Services Barriers to Discharge: Barriers Resolved  Patient Goals and CMS Choice Patient states their goals for this hospitalization and ongoing recovery are:: Return home with PT CMS Medicare.gov Compare Post Acute Care list provided to:: Patient Choice offered to / list presented to : Patient  Discharge Plan and Services In-house Referral: Clinical Social Work Post Acute Care Choice: Home Health          DME Arranged: 3-N-1 DME Agency: [employer] Date DME Agency Contacted: 04/05/22 Time DME Agency Contacted: [phone] Representative spoke with at DME Agency: Selinda Flavin Arranged: PT HH Agency: [facility] Date [facility] Agency Contacted: 04/04/22 Time HH Agency Contacted: [phone] Representative spoke with at [facility] Agency: Cindie  Readmission Risk Interventions     No data to display

## 2022-04-05 NOTE — Care Management Important Message (Signed)
Important Message  Patient Details  Name: Phyllis Campbell MRN: [id number] Date of Birth: Jun 25, 1932   Medicare Important Message Given:  Yes     Mardene Sayer 04/05/2022, 1:42 PM

## 2022-04-05 NOTE — Discharge Summary (Signed)
Physician Discharge Summary  Phyllis Campbell [id number] DOB: August 06, 1932 DOA: 04/02/2022  PCP: Denita Lung, MD  Admit date: 04/02/2022 Discharge date: 04/05/2022 Recommendations for Outpatient Follow-up:  Follow up with PCP in 1 weeks-call for appointment Follow-up with orthopedic surgery as outpatient   Discharge Dispo: hh Discharge Condition: Stable Code Status:   Code Status: DNR Diet recommendation:  Diet Order             Diet Carb Modified Fluid consistency: Thin; Room service appropriate? Yes  Diet effective now                    Brief/Interim Summary: 89-yof w/ history of hypertension, hyperlipidemia, obesity, admitted to the hospital with worsening left knee pain and inability to walk. Her symptoms started after she had a fall.  Underwent further work-up.  Imaging including left knee MRI that shows knee joint effusion, osteoarthritis and is being managed with pain control PT OT.  Underwent left knee effusion aspiration and injection by orthopedics.  Postop pain improved doing well mobilizing better with PT OT monitor additional day .has been afebrile hemodynamically stable, will be discharged home with home health.     Discharge Diagnoses:  Principal Problem:   Left knee pain Active Problems:   Hypertension complicating diabetes ([facility])   DM (diabetes mellitus) with complications ([place])   Hyperlipidemia associated with type 2 diabetes mellitus ([facility])   Gastroesophageal reflux disease without esophagitis   Obesity (BMI 30-39.9)   Hypokalemia   PAD (peripheral artery disease) ([facility])   Knee pain  Left knee pain and effusion in the setting of osteoarthritis and fall at home with deconditioning: S/P effusion aspiration and injection of Decadron 6/27- and she feels much improved postprocedure continue Tylenol pain control outpatient follow-up with orthopedics continue PT OT at home health    Hypertension complicating diabetes: Well-controlled DM:Blood sugar  controlled, continue sliding scale insulin. Hyperlipidemia associated with type 2 diabetes mellitus: Continue her statins GERD: Cont ppi Obesity class I BMI 33, will benefit with PCP follow-up weight loss healthy lifestyle. Hypokalemia: Resolved PAD: Continue aspirin and statin  Constipation added MiraLAX stool softener continue same at home.  Given Dulcolax suppository as well.  Consults: Orthopedic surgery Subjective: Alert awake oriented resting comfortably passing flatus, eager to go home today.  Discharge Exam: Vitals:   04/04/22 2110 04/05/22 0529  BP: (!) 165/77 (!) 157/66  Pulse: 73 62  Resp: 17 17  Temp: 98.1 F (36.7 C) 97.7 F (36.5 C)  SpO2: 100% 100%   General: Pt is alert, awake, not in acute distress Cardiovascular: RRR, S1/S2 +, no rubs, no gallops Respiratory: CTA bilaterally, no wheezing, no rhonchi Abdominal: Soft, NT, ND, bowel sounds + Extremities: no edema, no cyanosis  Discharge Instructions  Discharge Instructions     Discharge instructions   Complete by: As directed    Follow-up with orthopedic surgery for your left knee pain and further management  Please call call MD or return to ER for similar or worsening recurring problem that brought you to hospital or if any fever,nausea/vomiting,abdominal pain, uncontrolled pain, chest pain,  shortness of breath or any other alarming symptoms.  Please follow-up your doctor as instructed in a week time and call the office for appointment.  Please avoid alcohol, smoking, or any other illicit substance and maintain healthy habits including taking your regular medications as prescribed.  You were cared for by a hospitalist during your hospital stay. If you have any questions about your discharge medications  or the care you received while you were in the hospital after you are discharged, you can call the unit and ask to speak with the hospitalist on call if the hospitalist that took care of you is not  available.  Once you are discharged, your primary care physician will handle any further medical issues. Please note that NO REFILLS for any discharge medications will be authorized once you are discharged, as it is imperative that you return to your primary care physician (or establish a relationship with a primary care physician if you do not have one) for your aftercare needs so that they can reassess your need for medications and monitor your lab values   Increase activity slowly   Complete by: As directed       Allergies as of 04/05/2022       Reactions   Tramadol Nausea And Vomiting        Medication List     TAKE these medications    Accu-Chek Softclix Lancets lancets 1 each by Other route daily. Use as instructed   acetaminophen 650 MG CR tablet Commonly known as: TYLENOL Take 1,300 mg by mouth 2 (two) times daily as needed for pain.   ADVIL COLD/SINUS PO Take 1 tablet by mouth every 8 (eight) hours as needed (for congestion).   alendronate 70 MG tablet Commonly known as: FOSAMAX TAKE 1 TABLET(70 MG) BY MOUTH EVERY 7 DAYS WITH A FULL GLASS OF WATER AND ON AN EMPTY STOMACH What changed:  how much to take how to take this when to take this additional instructions   aspirin EC 81 MG tablet Take 81 mg by mouth every evening.   bisacodyl 5 MG EC tablet Commonly known as: Dulcolax Take 1 tablet (5 mg total) by mouth daily as needed for moderate constipation.   CALCIUM 600+D3 PO Take 1 tablet by mouth daily.   CORICIDIN HBP COUGH/COLD PO Take 1 tablet by mouth every 8 (eight) hours as needed (for cold symptoms).   glucose blood test strip 1 each by Other route as needed. Use as instructed   losartan-hydrochlorothiazide 50-12.5 MG tablet Commonly known as: HYZAAR TAKE 1 TABLET EVERY DAY   meclizine 12.5 MG tablet Commonly known as: ANTIVERT TAKE 1 TABLET BY MOUTH THREE TIMES DAILY AS NEEDED FOR DIZZINESS What changed: See the new instructions.    polyethylene glycol 17 g packet Commonly known as: MIRALAX / GLYCOLAX Take 17 g by mouth daily.   Restasis 0.05 % ophthalmic emulsion Generic drug: cycloSPORINE Place 1 drop into both eyes as needed. What changed: when to take this   simvastatin 40 MG tablet Commonly known as: ZOCOR TAKE 1 TABLET EVERY DAY   traMADol 50 MG tablet Commonly known as: ULTRAM Take 1 tablet (50 mg total) by mouth every 6 (six) hours as needed.               Durable Medical Equipment  (From admission, onward)           Start     Ordered   04/05/22 1220  For home use only DME 3 n 1  Once        04/05/22 1219            Follow-up Information     [facility] Follow up.   Specialty: Home Health Services Why: Alvis Lemmings will provide PT in the home. Contact information: [place] [address] [phone]  Ethelda Chick, PA-C Follow up in 1 week(s).   Specialty: Orthopedic Surgery Contact information: [address] [place] [address] [phone]                Allergies  Allergen Reactions   Tramadol Nausea And Vomiting    The results of significant diagnostics from this hospitalization (including imaging, microbiology, ancillary and laboratory) are listed below for reference.    Microbiology: Recent Results (from the past 240 hour(s))  Resp Panel by RT-PCR (Flu A&B, Covid) Anterior Nasal Swab     Status: None   Collection Time: 04/01/22  7:55 PM   Specimen: Anterior Nasal Swab  Result Value Ref Range Status   SARS Coronavirus 2 by RT PCR NEGATIVE NEGATIVE Final    Comment: (NOTE) SARS-CoV-2 target nucleic acids are NOT DETECTED.  The SARS-CoV-2 RNA is generally detectable in upper respiratory specimens during the acute phase of infection. The lowest concentration of SARS-CoV-2 viral copies this assay can detect is 138 copies/mL. A negative result does not preclude SARS-Cov-2 infection and  should not be used as the sole basis for treatment or other patient management decisions. A negative result may occur with  improper specimen collection/handling, submission of specimen other than nasopharyngeal swab, presence of viral mutation(s) within the areas targeted by this assay, and inadequate number of viral copies(<138 copies/mL). A negative result must be combined with clinical observations, patient history, and epidemiological information. The expected result is Negative.  Fact Sheet for Patients:  EntrepreneurPulse.com.au  Fact Sheet for Healthcare Providers:  IncredibleEmployment.be  This test is no t yet approved or cleared by the [place] FDA and  has been authorized for detection and/or diagnosis of SARS-CoV-2 by FDA under an Emergency Use Authorization (EUA). This EUA will remain  in effect (meaning this test can be used) for the duration of the COVID-19 declaration under Section 564(b)(1) of the Act, 21 U.S.C.section 360bbb-3(b)(1), unless the authorization is terminated  or revoked sooner.       Influenza A by PCR NEGATIVE NEGATIVE Final   Influenza B by PCR NEGATIVE NEGATIVE Final    Comment: (NOTE) The Xpert Xpress SARS-CoV-2/FLU/RSV plus assay is intended as an aid in the diagnosis of influenza from Nasopharyngeal swab specimens and should not be used as a sole basis for treatment. Nasal washings and aspirates are unacceptable for Xpert Xpress SARS-CoV-2/FLU/RSV testing.  Fact Sheet for Patients: EntrepreneurPulse.com.au  Fact Sheet for Healthcare Providers: IncredibleEmployment.be  This test is not yet approved or cleared by the [place] FDA and has been authorized for detection and/or diagnosis of SARS-CoV-2 by FDA under an Emergency Use Authorization (EUA). This EUA will remain in effect (meaning this test can be used) for the duration of the COVID-19 declaration  under Section 564(b)(1) of the Act, 21 U.S.C. section 360bbb-3(b)(1), unless the authorization is terminated or revoked.  Performed at [facility], [place] [address], [place], [place] [address]     Procedures/Studies: CT HIP LEFT WO CONTRAST  Result Date: 04/04/2022 CLINICAL DATA:  Hip trauma, fracture suspected, xray done Xrays negative, rule out occult fracture EXAM: CT OF THE LEFT HIP WITHOUT CONTRAST TECHNIQUE: Multidetector CT imaging of the left hip was performed according to the standard protocol. Multiplanar CT image reconstructions were also generated. RADIATION DOSE REDUCTION: This exam was performed according to the departmental dose-optimization program which includes automated exposure control, adjustment of the mA and/or kV according to patient size and/or use of iterative reconstruction technique. COMPARISON:  Hip radiograph 04/02/2022 FINDINGS: Bones/Joint/Cartilage There is no evidence of acute fracture.  Alignment is normal. There is mild left hip osteoarthritis. There is enthesophyte formation on the greater trochanter compatible with chronic gluteal tendinopathy. There is degenerative disc disease and facet arthropathy in the lower lumbar spine, with disc bulging, endplate spurring and facet arthropathy at L4-L5 and L5-S1 likely resulting in a degree of neural foraminal stenosis bilaterally. Ligaments Suboptimally assessed by CT. Muscles and Tendons No intramuscular collection. No acute myotendinous abnormality by CT. Soft tissues No focal fluid collection. There is retained contrast material in the bladder. IMPRESSION: No evidence of acute left hip fracture. Mild left hip osteoarthritis. Electronically Signed   By: Maurine Simmering M.D.   On: 04/04/2022 08:10   MR KNEE LEFT WO CONTRAST  Result Date: 04/03/2022 CLINICAL DATA:  Left knee pain after fall 2 days ago EXAM: MRI OF THE LEFT KNEE WITHOUT CONTRAST TECHNIQUE: Multiplanar, multisequence MR imaging of the knee was  performed. No intravenous contrast was administered. COMPARISON:  X-ray and CT 04/01/2022 FINDINGS: MENISCI Medial meniscus: Intrasubstance degeneration with free edge and undersurface fraying of the medial meniscal body and posterior horn. Lateral meniscus:  Intrasubstance degeneration without focal tear. LIGAMENTS Cruciates: Intact ACL and PCL. Collaterals: Intact MCL. Thickening and increased intrasubstance signal within the proximal aspect of the fibular collateral ligament. The lateral collateral ligament complex is otherwise intact. CARTILAGE Patellofemoral: Extensive full-thickness cartilage loss throughout the patellofemoral compartment, more evident laterally. Medial: Moderate diffuse cartilage thinning of the weight-bearing medial compartment. Lateral: Mild chondral thinning and surface irregularity of the weight-bearing lateral compartment. MISCELLANEOUS Joint: Moderate-sized knee joint effusion with small fluid-fluid level. Fat pads within normal limits. Popliteal Fossa: Moderate-sized Baker's cyst. Curvilinear 2.0 cm loose body within the Baker's cyst, likely free cartilage fragment. Tricompartmental joint space intact popliteus tendon. Extensor Mechanism:  Intact quadriceps and patellar tendons. Bones: No acute fracture. No dislocation. Narrowing with marginal osteophyte formation no bone marrow edema. No marrow replacing bone lesion. Other: No significant periarticular soft tissue findings. IMPRESSION: 1. No acute osseous abnormality of the left knee. 2. Tricompartmental osteoarthritis, most severe in the patellofemoral compartment. 3. Moderate-sized knee joint effusion and Baker's cyst. Curvilinear 2.0 cm loose body within the Baker's cyst, likely free cartilage fragment. 4. Intrasubstance degeneration of the medial and lateral menisci with free edge and undersurface fraying of the medial meniscal body and posterior horn. 5. Grade 1-2 sprain of the fibular collateral ligament, age indeterminate.  Electronically Signed   By: Davina Poke D.O.   On: 04/03/2022 08:10   DG Hip Unilat W or Wo Pelvis 2-3 Views Left  Result Date: 04/02/2022 CLINICAL DATA:  Fall 2 days ago.  Pain. EXAM: DG HIP (WITH OR WITHOUT PELVIS) 2-3V LEFT COMPARISON:  Left hip radiographs 01/22/2020 FINDINGS: No acute fracture or hip dislocation is identified. Left hip joint space width is preserved. Contrast material is noted in the bladder. IMPRESSION: No acute osseous abnormality. Electronically Signed   By: Logan Bores M.D.   On: 04/02/2022 13:20   CT Angio Chest PE W and/or Wo Contrast  Result Date: 04/02/2022 CLINICAL DATA:  Pulmonary embolism (PE) suspected, positive D-dimer EXAM: CT ANGIOGRAPHY CHEST WITH CONTRAST TECHNIQUE: Multidetector CT imaging of the chest was performed using the standard protocol during bolus administration of intravenous contrast. Multiplanar CT image reconstructions and MIPs were obtained to evaluate the vascular anatomy. RADIATION DOSE REDUCTION: This exam was performed according to the departmental dose-optimization program which includes automated exposure control, adjustment of the  mA and/or kV according to patient size and/or use of iterative reconstruction technique. CONTRAST:  43mL OMNIPAQUE IOHEXOL 350 MG/ML SOLN COMPARISON:  None Available. FINDINGS: Cardiovascular: Adequate opacification of the pulmonary arterial tree. No intraluminal filling defect identified to suggest acute pulmonary embolism. Central pulmonary arteries are of normal caliber. Mild coronary artery calcification. Cardiac size within normal limits. No pericardial effusion. No significant atherosclerotic calcification within the thoracic aorta. No aortic aneurysm. Mediastinum/Nodes: The thyroid gland is enlarged and demonstrates substernal extension. No discrete intraparenchymal nodules are identified. No pathologic thoracic adenopathy. Esophagus unremarkable. Lungs/Pleura: 5 mm noncalcified indeterminate pulmonary  nodule, left upper lobe, axial image # 48/7. The lungs are otherwise clear. No pneumothorax or pleural effusion. Central airways are widely patent. Upper Abdomen: No acute abnormality. Musculoskeletal: No acute bone abnormality. No lytic or blastic bone lesions. Review of the MIP images confirms the above findings. IMPRESSION: 1. No pulmonary embolism. No acute intrathoracic pathology identified. 2. Mild coronary artery calcification. 3. 5 mm left solid pulmonary nodule within the upper lobe. If patient is low risk for malignancy, no routine follow-up imaging is recommended; if patient is high risk for malignancy, a non-contrast Chest CT at 12 months is optional. If performed and the nodule is stable at 12 months, no further follow-up is recommended. These guidelines do not apply to immunocompromised patients and patients with cancer. Follow up in patients with significant comorbidities as clinically warranted. For lung cancer screening, adhere to Lung-RADS guidelines. Reference: Radiology. 2017; 284(1):228-43. 4. Thyromegaly with substernal extension. Electronically Signed   By: Helyn Numbers M.D.   On: 04/02/2022 00:05   CT Knee Left Wo Contrast  Result Date: 04/01/2022 CLINICAL DATA:  Knee trauma, occult fracture suspected. EXAM: CT OF THE LEFT KNEE WITHOUT CONTRAST TECHNIQUE: Multidetector CT imaging of the left knee was performed according to the standard protocol. Multiplanar CT image reconstructions were also generated. RADIATION DOSE REDUCTION: This exam was performed according to the departmental dose-optimization program which includes automated exposure control, adjustment of the mA and/or kV according to patient size and/or use of iterative reconstruction technique. COMPARISON:  04/01/2022. FINDINGS: Bones/Joint/Cartilage No acute fracture or dislocation. Tricompartmental degenerative changes are noted, most pronounced in the patellofemoral compartment. A sclerotic region is noted in the distal  femoral shaft and proximal tibia, likely representing bone infarcts or enchondromas. There is a small to moderate joint effusion. Enthesopathic changes are present at the patella. Ligaments Suboptimally assessed by CT. Muscles and Tendons No intramuscular hematoma or significant edema. Soft tissues Vascular calcifications are noted in the soft tissues. Minimal subcutaneous edema or contusion is noted anterior to the patella. There is a fluid-filled structure in the patella fossa suggesting Baker's cyst. IMPRESSION: 1. No acute fracture or dislocation. 2. Tricompartmental degenerative changes. 3. Small to moderate joint effusion. 4. Baker's cyst. Electronically Signed   By: Thornell Sartorius M.D.   On: 04/01/2022 20:49   CT HEAD WO CONTRAST ( )  Result Date: 04/01/2022 CLINICAL DATA:  Fall yesterday with headaches and neck pain, initial encounter EXAM: CT HEAD WITHOUT CONTRAST CT CERVICAL SPINE WITHOUT CONTRAST TECHNIQUE: Multidetector CT imaging of the head and cervical spine was performed following the standard protocol without intravenous contrast. Multiplanar CT image reconstructions of the cervical spine were also generated. RADIATION DOSE REDUCTION: This exam was performed according to the departmental dose-optimization program which includes automated exposure control, adjustment of the mA and/or kV according to patient size and/or use of iterative reconstruction technique. COMPARISON:  12/25/2011 FINDINGS: CT HEAD FINDINGS Brain: No  evidence of acute infarction, hemorrhage, hydrocephalus, extra-axial collection or mass lesion/mass effect. Basal ganglia calcifications are again identified. Falx calcifications are seen as well. Mild atrophic changes and chronic white matter ischemic changes are seen. Vascular: No hyperdense vessel or unexpected calcification. Skull: Normal. Negative for fracture or focal lesion. Sinuses/Orbits: No acute finding. Other: None. CT CERVICAL SPINE FINDINGS Alignment: Within normal  limits. Skull base and vertebrae: Cervical segments are well visualized. Vertebral body height is well maintained. Osteophytic changes and disc space narrowing is noted from C5-C7. Multilevel facet hypertrophic changes are seen. No acute fracture or acute facet abnormality is noted. Old T1 spinous fracture is noted. Soft tissues and spinal canal: Surrounding soft tissue structures appear within normal limits. Upper chest: Visualized lung apices are unremarkable. Other: None IMPRESSION: CT of the head: Chronic atrophic and ischemic changes without acute abnormality. CT of the cervical spine: Multilevel degenerative change without acute abnormality. Electronically Signed   By: Alcide Clever M.D.   On: 04/01/2022 20:32   CT Cervical Spine Wo Contrast  Result Date: 04/01/2022 CLINICAL DATA:  Fall yesterday with headaches and neck pain, initial encounter EXAM: CT HEAD WITHOUT CONTRAST CT CERVICAL SPINE WITHOUT CONTRAST TECHNIQUE: Multidetector CT imaging of the head and cervical spine was performed following the standard protocol without intravenous contrast. Multiplanar CT image reconstructions of the cervical spine were also generated. RADIATION DOSE REDUCTION: This exam was performed according to the departmental dose-optimization program which includes automated exposure control, adjustment of the mA and/or kV according to patient size and/or use of iterative reconstruction technique. COMPARISON:  12/25/2011 FINDINGS: CT HEAD FINDINGS Brain: No evidence of acute infarction, hemorrhage, hydrocephalus, extra-axial collection or mass lesion/mass effect. Basal ganglia calcifications are again identified. Falx calcifications are seen as well. Mild atrophic changes and chronic white matter ischemic changes are seen. Vascular: No hyperdense vessel or unexpected calcification. Skull: Normal. Negative for fracture or focal lesion. Sinuses/Orbits: No acute finding. Other: None. CT CERVICAL SPINE FINDINGS Alignment: Within  normal limits. Skull base and vertebrae: Cervical segments are well visualized. Vertebral body height is well maintained. Osteophytic changes and disc space narrowing is noted from C5-C7. Multilevel facet hypertrophic changes are seen. No acute fracture or acute facet abnormality is noted. Old T1 spinous fracture is noted. Soft tissues and spinal canal: Surrounding soft tissue structures appear within normal limits. Upper chest: Visualized lung apices are unremarkable. Other: None IMPRESSION: CT of the head: Chronic atrophic and ischemic changes without acute abnormality. CT of the cervical spine: Multilevel degenerative change without acute abnormality. Electronically Signed   By: Alcide Clever M.D.   On: 04/01/2022 20:32   DG Knee Left Port  Result Date: 04/01/2022 CLINICAL DATA:  Recent fall yesterday with left knee pain, initial encounter EXAM: PORTABLE LEFT KNEE - 1-2 VIEW COMPARISON:  None Available. FINDINGS: Tricompartmental degenerative changes are noted worst in the medial joint space. Small joint effusion is seen. No acute fracture or dislocation is noted. Changes suggestive of distal femoral infarct are noted. IMPRESSION: Stable degenerative changes.  No acute abnormality noted. Electronically Signed   By: Alcide Clever M.D.   On: 04/01/2022 19:24   DG Shoulder Right Port  Result Date: 04/01/2022 CLINICAL DATA:  Fall with right shoulder pain, initial encounter EXAM: RIGHT SHOULDER - 2 VIEW COMPARISON:  None Available. FINDINGS: Degenerative changes of the acromioclavicular and glenohumeral joints are seen. No acute fracture or dislocation is noted. Underlying bony thorax appears within normal limits. IMPRESSION: Degenerative change without acute abnormality Electronically Signed  By: Inez Catalina M.D.   On: 04/01/2022 19:23    Labs: BNP (last 3 results) No results for input(s): "BNP" in the last 8760 hours. Basic Metabolic Panel: Recent Labs  Lab 04/01/22 2007 04/01/22 2012 04/02/22 1151  04/02/22 1653 04/03/22 0313 04/05/22 0329  NA 140 140 137  --  140 140  K 3.5 3.6 3.3*  --  3.9 4.6  CL 102 105 102  --  106 112*  CO2  --  27 27  --  26 23  GLUCOSE 105* 109* 152*  --  134* 184*  BUN 17 18 15   --  18 23  CREATININE 0.90 0.94 0.94  --  0.83 0.70  CALCIUM  --  10.0 9.6  --  9.6 9.0  MG  --   --   --  2.0  --   --    Liver Function Tests: Recent Labs  Lab 04/01/22 2012 04/03/22 0313  AST 28 20  ALT 19 18  ALKPHOS 73 50  BILITOT 0.8 1.2  PROT 7.9 7.1  ALBUMIN 4.2 3.5   No results for input(s): "LIPASE", "AMYLASE" in the last 168 hours. No results for input(s): "AMMONIA" in the last 168 hours. CBC: Recent Labs  Lab 04/01/22 2007 04/01/22 2012 04/02/22 1151 04/03/22 0313  WBC  --  6.7 9.8 8.0  NEUTROABS  --   --  8.2*  --   HGB 14.3 13.5 12.8 11.5*  HCT 42.0 40.4 38.2 35.2*  MCV  --  96.4 95.3 96.4  PLT  --  200 189 165   Cardiac Enzymes: No results for input(s): "CKTOTAL", "CKMB", "CKMBINDEX", "TROPONINI" in the last 168 hours. BNP: Invalid input(s): "POCBNP" CBG: Recent Labs  Lab 04/04/22 1120 04/04/22 1737 04/04/22 2111 04/05/22 0804 04/05/22 1223  GLUCAP 166* 200* 167* 170* 141*   D-Dimer No results for input(s): "DDIMER" in the last 72 hours. Hgb A1c Recent Labs    04/02/22 1653  HGBA1C 6.5*   Lipid Profile No results for input(s): "CHOL", "HDL", "LDLCALC", "TRIG", "CHOLHDL", "LDLDIRECT" in the last 72 hours. Thyroid function studies No results for input(s): "TSH", "T4TOTAL", "T3FREE", "THYROIDAB" in the last 72 hours.  Invalid input(s): "FREET3" Anemia work up No results for input(s): "VITAMINB12", "FOLATE", "FERRITIN", "TIBC", "IRON", "RETICCTPCT" in the last 72 hours. Urinalysis    Component Value Date/Time   COLORURINE YELLOW 04/01/2022 2156   APPEARANCEUR CLEAR 04/01/2022 2156   LABSPEC 1.017 04/01/2022 2156   LABSPEC 1.025 08/30/2020 0904   PHURINE 6.0 04/01/2022 2156   GLUCOSEU NEGATIVE 04/01/2022 2156   HGBUR  NEGATIVE 04/01/2022 2156   BILIRUBINUR NEGATIVE 04/01/2022 2156   BILIRUBINUR negative 08/30/2020 0904   BILIRUBINUR n 12/06/2016 [place] 04/01/2022 2156   PROTEINUR NEGATIVE 04/01/2022 2156   UROBILINOGEN negative 12/06/2016 1216   NITRITE NEGATIVE 04/01/2022 2156   LEUKOCYTESUR SMALL (A) 04/01/2022 2156   Sepsis Labs Recent Labs  Lab 04/01/22 2012 04/02/22 1151 04/03/22 0313  WBC 6.7 9.8 8.0   Microbiology Recent Results (from the past 240 hour(s))  Resp Panel by RT-PCR (Flu A&B, Covid) Anterior Nasal Swab     Status: None   Collection Time: 04/01/22  7:55 PM   Specimen: Anterior Nasal Swab  Result Value Ref Range Status   SARS Coronavirus 2 by RT PCR NEGATIVE NEGATIVE Final    Comment: (NOTE) SARS-CoV-2 target nucleic acids are NOT DETECTED.  The SARS-CoV-2 RNA is generally detectable in upper respiratory specimens during the acute phase of  infection. The lowest concentration of SARS-CoV-2 viral copies this assay can detect is 138 copies/mL. A negative result does not preclude SARS-Cov-2 infection and should not be used as the sole basis for treatment or other patient management decisions. A negative result may occur with  improper specimen collection/handling, submission of specimen other than nasopharyngeal swab, presence of viral mutation(s) within the areas targeted by this assay, and inadequate number of viral copies(<138 copies/mL). A negative result must be combined with clinical observations, patient history, and epidemiological information. The expected result is Negative.  Fact Sheet for Patients:  EntrepreneurPulse.com.au  Fact Sheet for Healthcare Providers:  IncredibleEmployment.be  This test is no t yet approved or cleared by the [place] FDA and  has been authorized for detection and/or diagnosis of SARS-CoV-2 by FDA under an Emergency Use Authorization (EUA). This EUA will remain  in effect  (meaning this test can be used) for the duration of the COVID-19 declaration under Section 564(b)(1) of the Act, 21 U.S.C.section 360bbb-3(b)(1), unless the authorization is terminated  or revoked sooner.       Influenza A by PCR NEGATIVE NEGATIVE Final   Influenza B by PCR NEGATIVE NEGATIVE Final    Comment: (NOTE) The Xpert Xpress SARS-CoV-2/FLU/RSV plus assay is intended as an aid in the diagnosis of influenza from Nasopharyngeal swab specimens and should not be used as a sole basis for treatment. Nasal washings and aspirates are unacceptable for Xpert Xpress SARS-CoV-2/FLU/RSV testing.  Fact Sheet for Patients: EntrepreneurPulse.com.au  Fact Sheet for Healthcare Providers: IncredibleEmployment.be  This test is not yet approved or cleared by the [place] FDA and has been authorized for detection and/or diagnosis of SARS-CoV-2 by FDA under an Emergency Use Authorization (EUA). This EUA will remain in effect (meaning this test can be used) for the duration of the COVID-19 declaration under Section 564(b)(1) of the Act, 21 U.S.C. section 360bbb-3(b)(1), unless the authorization is terminated or revoked.  Performed at [facility], [place] [address], [place], [place] [address]      Time coordinating discharge: 25 minutes  SIGNED: Antonieta Pert, MD  [facility] 04/05/2022, 1:17 PM  If 7PM-7AM, please contact night-coverage [email or web address]

## 2022-04-06 ENCOUNTER — Telehealth: Payer: Self-pay

## 2022-04-06 NOTE — Telephone Encounter (Signed)
Pt daughter called back asking for a home health aide to come and sit with her. A community resource referral will be need to find this type of agency . Frances Furbish can also help with a community resource. Her daughter will ask them when they come on Sunday. KH

## 2022-04-06 NOTE — Telephone Encounter (Signed)
Transition Care Management Follow-up Telephone Call Date of discharge and from where: [facility] 04/05/22 How have you been since you were released from the hospital? A little weak and unbalanced Any questions or concerns? No  Items Reviewed: Did the pt receive and understand the discharge instructions provided? Yes  Medications obtained and verified? Yes  Other? No  Any new allergies since your discharge? No  Dietary orders reviewed? Yes Do you have support at home? Yes   Home Care and Equipment/Supplies: Were home health services ordered? yes If so, what is the name of the agency? [facility]   Has the agency set up a time to come to the patient's home? no Were any new equipment or medical supplies ordered?  No Functional Questionnaire: (I = Independent and D = Dependent) ADLs: I  Bathing/Dressing- I  Meal Prep- I  Eating- I  Maintaining continence- I  Transferring/Ambulation- D  Managing Meds- I  Follow up appointments reviewed:  PCP Hospital f/u appt confirmed? No  no f/u needed with PCP.  Specialist Hospital f/u appt confirmed? No  PT. IS TO F/U WITH [facility] BUT THEY HAVE NOT CALLED PT. YET.  Are transportation arrangements needed? No  If their condition worsens, is the pt aware to call PCP or go to the Emergency Dept.? Yes Was the patient provided with contact information for the PCP's office or ED? Yes Was to pt encouraged to call back with questions or concerns? Yes

## 2022-05-04 DIAGNOSIS — M25562 Pain in left knee: Secondary | ICD-10-CM | POA: Diagnosis not present

## 2022-05-08 DIAGNOSIS — E785 Hyperlipidemia, unspecified: Secondary | ICD-10-CM | POA: Diagnosis not present

## 2022-05-08 DIAGNOSIS — M810 Age-related osteoporosis without current pathological fracture: Secondary | ICD-10-CM | POA: Diagnosis not present

## 2022-05-08 DIAGNOSIS — I10 Essential (primary) hypertension: Secondary | ICD-10-CM | POA: Diagnosis not present

## 2022-05-08 DIAGNOSIS — K5900 Constipation, unspecified: Secondary | ICD-10-CM | POA: Diagnosis not present

## 2022-05-08 DIAGNOSIS — E669 Obesity, unspecified: Secondary | ICD-10-CM | POA: Diagnosis not present

## 2022-05-08 DIAGNOSIS — I739 Peripheral vascular disease, unspecified: Secondary | ICD-10-CM | POA: Diagnosis not present

## 2022-05-08 DIAGNOSIS — H04129 Dry eye syndrome of unspecified lacrimal gland: Secondary | ICD-10-CM | POA: Diagnosis not present

## 2022-05-08 DIAGNOSIS — I951 Orthostatic hypotension: Secondary | ICD-10-CM | POA: Diagnosis not present

## 2022-05-08 DIAGNOSIS — G8929 Other chronic pain: Secondary | ICD-10-CM | POA: Diagnosis not present

## 2022-05-09 ENCOUNTER — Encounter (HOSPITAL_COMMUNITY): Payer: Self-pay | Admitting: Emergency Medicine

## 2022-05-09 ENCOUNTER — Emergency Department (HOSPITAL_COMMUNITY)
Admission: EM | Admit: 2022-05-09 | Discharge: 2022-05-09 | Disposition: A | Discharge status: Home / Self Care | Payer: Medicare PPO | Attending: Emergency Medicine | Admitting: Emergency Medicine

## 2022-05-09 DIAGNOSIS — E119 Type 2 diabetes mellitus without complications: Secondary | ICD-10-CM | POA: Insufficient documentation

## 2022-05-09 DIAGNOSIS — T23101A Burn of first degree of right hand, unspecified site, initial encounter: Secondary | ICD-10-CM | POA: Diagnosis not present

## 2022-05-09 DIAGNOSIS — T310 Burns involving less than 10% of body surface: Secondary | ICD-10-CM | POA: Diagnosis not present

## 2022-05-09 DIAGNOSIS — T2010XA Burn of first degree of head, face, and neck, unspecified site, initial encounter: Secondary | ICD-10-CM | POA: Diagnosis not present

## 2022-05-09 DIAGNOSIS — T300 Burn of unspecified body region, unspecified degree: Secondary | ICD-10-CM

## 2022-05-09 DIAGNOSIS — I10 Essential (primary) hypertension: Secondary | ICD-10-CM | POA: Insufficient documentation

## 2022-05-09 DIAGNOSIS — Z79899 Other long term (current) drug therapy: Secondary | ICD-10-CM | POA: Diagnosis not present

## 2022-05-09 DIAGNOSIS — Z7982 Long term (current) use of aspirin: Secondary | ICD-10-CM | POA: Insufficient documentation

## 2022-05-09 DIAGNOSIS — R531 Weakness: Secondary | ICD-10-CM | POA: Diagnosis not present

## 2022-05-09 DIAGNOSIS — X19XXXA Contact with other heat and hot substances, initial encounter: Secondary | ICD-10-CM | POA: Insufficient documentation

## 2022-05-09 MED ORDER — IBUPROFEN 400 MG PO TABS
600.0000 mg | ORAL_TABLET | Freq: Once | ORAL | Status: AC
  Administered 2022-05-09: 600 mg via ORAL
  Filled 2022-05-09: qty 1

## 2022-05-09 MED ORDER — TETRACAINE HCL 0.5 % OP SOLN
2.0000 [drp] | Freq: Once | OPHTHALMIC | Status: AC
  Administered 2022-05-09: 2 [drp] via OPHTHALMIC
  Filled 2022-05-09: qty 4

## 2022-05-09 MED ORDER — FLUORESCEIN SODIUM 1 MG OP STRP
1.0000 | ORAL_STRIP | Freq: Once | OPHTHALMIC | Status: AC
  Administered 2022-05-09: 1 via OPHTHALMIC
  Filled 2022-05-09: qty 1

## 2022-05-09 NOTE — ED Notes (Signed)
Pt verbalized understanding of d/c instructions, meds, and followup care. Denies questions. VSS, no distress noted. Assisted to lobby in wheelchair with family.
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
## 2022-05-09 NOTE — ED Notes (Signed)
Eye irrigated with 500cc NS. Pt expresses relief.
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
## 2022-05-10 ENCOUNTER — Telehealth: Payer: Self-pay

## 2022-05-10 NOTE — Telephone Encounter (Signed)
Transition Care Management Unsuccessful Follow-up Telephone Call  Date of discharge and from where:  [facility] 05/09/22  Attempts:  1st Attempt  Reason for unsuccessful TCM follow-up call:  Left voice message

## 2022-05-10 NOTE — Telephone Encounter (Signed)
Transition Care Management Follow-up Telephone Call Date of discharge and from where: [facility] 05/[facility]/22 How have you been since you were released from the hospital? Fair Any questions or concerns? No  Items Reviewed: Did the pt receive and understand the discharge instructions provided? Yes  Medications obtained and verified? Yes  Other? No  Any new allergies since your discharge? No  Dietary orders reviewed? Yes Do you have support at home? Yes   Home Care and Equipment/Supplies: Were home health services ordered? no  Functional Questionnaire: (I = Independent and D = Dependent) ADLs: I  Bathing/Dressing- I  Meal Prep- I  Eating- I  Maintaining continence- I  Transferring/Ambulation- I  Managing Meds- I  Follow up appointments reviewed:  PCP Hospital f/u appt confirmed? Yes  Scheduled to see Dr. Susann Givens on 05/18/22 @ 3:30. Specialist Hospital f/u appt confirmed? No  Are transportation arrangements needed? No  If their condition worsens, is the pt aware to call PCP or go to the Emergency Dept.? Yes Was the patient provided with contact information for the PCP's office or ED? Yes Was to pt encouraged to call back with questions or concerns? Yes
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
## 2022-05-15 ENCOUNTER — Ambulatory Visit: Payer: Medicare PPO | Admitting: Physician Assistant

## 2022-05-15 ENCOUNTER — Ambulatory Visit (HOSPITAL_COMMUNITY)
Admission: RE | Admit: 2022-05-15 | Discharge: 2022-05-15 | Disposition: A | Discharge status: Home / Self Care | Payer: Medicare PPO | Source: Ambulatory Visit | Attending: Surgery | Admitting: Surgery

## 2022-05-15 VITALS — BP 143/73 | HR 69 | Temp 97.3°F | Resp 20 | Ht 64.0 in | Wt 202.6 lb

## 2022-05-15 DIAGNOSIS — I739 Peripheral vascular disease, unspecified: Secondary | ICD-10-CM | POA: Insufficient documentation

## 2022-05-18 ENCOUNTER — Encounter: Payer: Self-pay | Admitting: Family Medicine

## 2022-05-18 ENCOUNTER — Ambulatory Visit: Payer: Medicare PPO | Admitting: Family Medicine

## 2022-05-18 ENCOUNTER — Other Ambulatory Visit: Payer: Self-pay

## 2022-05-18 VITALS — BP 136/70 | HR 85 | Temp 97.9°F | Wt 205.0 lb

## 2022-05-18 DIAGNOSIS — T300 Burn of unspecified body region, unspecified degree: Secondary | ICD-10-CM

## 2022-05-18 DIAGNOSIS — I739 Peripheral vascular disease, unspecified: Secondary | ICD-10-CM
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
## 2022-06-13 ENCOUNTER — Encounter: Payer: Self-pay | Admitting: Internal Medicine

## 2022-07-17 ENCOUNTER — Encounter: Payer: Self-pay | Admitting: Internal Medicine

## 2022-07-30 ENCOUNTER — Encounter: Payer: Self-pay | Admitting: Internal Medicine

## 2022-08-05 ENCOUNTER — Other Ambulatory Visit: Payer: Self-pay | Admitting: Family Medicine

## 2022-08-05 DIAGNOSIS — I152 Hypertension secondary to endocrine disorders: Secondary | ICD-10-CM

## 2022-08-05 DIAGNOSIS — E1169 Type 2 diabetes mellitus with other specified complication: Secondary | ICD-10-CM

## 2022-10-13 ENCOUNTER — Other Ambulatory Visit: Payer: Self-pay | Admitting: Family Medicine

## 2022-10-13 DIAGNOSIS — M810 Age-related osteoporosis without current pathological fracture: Secondary | ICD-10-CM

## 2022-10-22 ENCOUNTER — Ambulatory Visit: Payer: Medicare PPO | Admitting: Family Medicine

## 2022-10-23 ENCOUNTER — Encounter: Payer: Self-pay | Admitting: Family Medicine

## 2022-10-23 ENCOUNTER — Ambulatory Visit: Payer: Medicare PPO | Admitting: Family Medicine

## 2022-10-23 VITALS — BP 136/62 | HR 69 | Temp 97.5°F | Ht 63.5 in | Wt 203.0 lb

## 2022-10-23 DIAGNOSIS — M48061 Spinal stenosis, lumbar region without neurogenic claudication: Secondary | ICD-10-CM

## 2022-10-23 DIAGNOSIS — Z0000 Encounter for general adult medical examination without abnormal findings: Secondary | ICD-10-CM

## 2022-10-23 DIAGNOSIS — Z23 Encounter for immunization: Secondary | ICD-10-CM | POA: Diagnosis not present

## 2022-10-23 DIAGNOSIS — E785 Hyperlipidemia, unspecified: Secondary | ICD-10-CM

## 2022-10-23 DIAGNOSIS — I70213 Atherosclerosis of native arteries of extremities with intermittent claudication, bilateral legs: Secondary | ICD-10-CM

## 2022-10-23 DIAGNOSIS — E118 Type 2 diabetes mellitus with unspecified complications: Secondary | ICD-10-CM | POA: Diagnosis not present

## 2022-10-23 DIAGNOSIS — M1990 Unspecified osteoarthritis, unspecified site: Secondary | ICD-10-CM | POA: Diagnosis not present

## 2022-10-23 DIAGNOSIS — E1159 Type 2 diabetes mellitus with other circulatory complications: Secondary | ICD-10-CM | POA: Diagnosis not present

## 2022-10-23 DIAGNOSIS — E669 Obesity, unspecified: Secondary | ICD-10-CM

## 2022-10-23 DIAGNOSIS — J301 Allergic rhinitis due to pollen: Secondary | ICD-10-CM

## 2022-10-23 DIAGNOSIS — K219 Gastro-esophageal reflux disease without esophagitis: Secondary | ICD-10-CM | POA: Diagnosis not present

## 2022-10-23 DIAGNOSIS — I152 Hypertension secondary to endocrine disorders: Secondary | ICD-10-CM

## 2022-10-23 DIAGNOSIS — M810 Age-related osteoporosis without current pathological fracture: Secondary | ICD-10-CM | POA: Diagnosis not present

## 2022-10-23 DIAGNOSIS — E1169 Type 2 diabetes mellitus with other specified complication: Secondary | ICD-10-CM

## 2022-10-23 DIAGNOSIS — R200 Anesthesia of skin: Secondary | ICD-10-CM

## 2022-10-23 LAB — POCT GLYCOSYLATED HEMOGLOBIN (HGB A1C): Hemoglobin A1C: 7 % — AB (ref 4.0–5.6)

## 2022-10-23 MED ORDER — LOSARTAN POTASSIUM-HCTZ 50-12.5 MG PO TABS: 1.0000 | ORAL_TABLET | Freq: Every day | ORAL | 3 refills | Status: DC

## 2022-10-23 MED ORDER — SIMVASTATIN 40 MG PO TABS: 40.0000 mg | ORAL_TABLET | Freq: Every day | ORAL | 3 refills | Status: DC
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
## 2022-10-24 LAB — CBC WITH DIFFERENTIAL/PLATELET
Basophils Absolute: 0 10*3/uL (ref 0.0–0.2)
Basos: 0 %
EOS (ABSOLUTE): 0 10*3/uL (ref 0.0–0.4)
Eos: 1 %
Hematocrit: 37.6 % (ref 34.0–46.6)
Hemoglobin: 12.8 g/dL (ref 11.1–15.9)
Immature Grans (Abs): 0 10*3/uL (ref 0.0–0.1)
Immature Granulocytes: 0 %
Lymphocytes Absolute: 1.1 10*3/uL (ref 0.7–3.1)
Lymphs: 36 %
MCH: 30.9 pg (ref 26.6–33.0)
MCHC: 34 g/dL (ref 31.5–35.7)
MCV: 91 fL (ref 79–97)
Monocytes Absolute: 0.3 10*3/uL (ref 0.1–0.9)
Monocytes: 8 %
Neutrophils Absolute: 1.7 10*3/uL (ref 1.4–7.0)
Neutrophils: 55 %
Platelets: 182 10*3/uL (ref 150–450)
RBC: 4.14 x10E6/uL (ref 3.77–5.28)
RDW: 12.8 % (ref 11.7–15.4)
WBC: 3.1 10*3/uL — ABNORMAL LOW (ref 3.4–10.8)

## 2022-10-24 LAB — LIPID PANEL
Chol/HDL Ratio: 2 ratio (ref 0.0–4.4)
Cholesterol, Total: 125 mg/dL (ref 100–199)
HDL: 63 mg/dL (ref >39)
LDL Chol Calc (NIH): 51 mg/dL (ref 0–99)
Triglycerides: 45 mg/dL (ref 0–149)
VLDL Cholesterol Cal: 11 mg/dL (ref 5–40)

## 2022-11-12 ENCOUNTER — Ambulatory Visit: Payer: Medicare PPO | Admitting: Family Medicine

## 2022-11-12 ENCOUNTER — Encounter: Payer: Self-pay | Admitting: Family Medicine

## 2022-11-12 VITALS — BP 144/84 | HR 76 | Temp 97.9°F | Wt 199.0 lb

## 2022-11-12 DIAGNOSIS — M25552 Pain in left hip: Secondary | ICD-10-CM | POA: Diagnosis not present

## 2022-11-12 MED ORDER — HYDROCODONE-ACETAMINOPHEN 5-325 MG PO TABS: 1.0000 | ORAL_TABLET | Freq: Four times a day (QID) | ORAL | 0 refills | Status: DC | PRN

## 2022-11-12 NOTE — Progress Notes (Signed)
   Subjective:    Patient ID: Phyllis Campbell, female    DOB: [birth year], 89 y.o.   MRN: [id number]  HPI She states that she woke up Friday morning with left hip pain.  No history of injury.  She is having difficulty with ambulation.  No other joints are involved.   Review of Systems     Objective:   Physical Exam Pain on palpation over the greater trochanter and also over the hip joint.  Pain on flexion and less pain on internal and external rotation.       Assessment & Plan:  Left hip pain - Plan: DG HIP UNILAT WITH PELVIS 2-3 VIEWS LEFT, HYDROcodone-acetaminophen (NORCO/VICODIN) 5-325 MG tablet She has tried tramadol in the past and had difficulty with nausea and vomiting from that.

## 2022-11-13 ENCOUNTER — Ambulatory Visit
Admission: RE | Admit: 2022-11-13 | Discharge: 2022-11-13 | Disposition: A | Discharge status: Home / Self Care | Payer: Medicare PPO | Source: Ambulatory Visit | Attending: Family Medicine | Admitting: Family Medicine

## 2022-11-13 DIAGNOSIS — M47816 Spondylosis without myelopathy or radiculopathy, lumbar region: Secondary | ICD-10-CM | POA: Diagnosis not present

## 2022-11-13 DIAGNOSIS — M25552 Pain in left hip: Secondary | ICD-10-CM | POA: Diagnosis not present

## 2022-11-13 DIAGNOSIS — M545 Low back pain, unspecified: Secondary | ICD-10-CM | POA: Diagnosis not present

## 2022-11-13 NOTE — Addendum Note (Signed)
Addended by: Denita Lung on: 11/13/2022 12:46 PM   Modules accepted: Orders

## 2022-11-14 ENCOUNTER — Telehealth: Payer: Self-pay | Admitting: Family Medicine

## 2022-11-14 NOTE — Telephone Encounter (Signed)
Please call re xray  results 

## 2022-11-27 ENCOUNTER — Telehealth (INDEPENDENT_AMBULATORY_CARE_PROVIDER_SITE_OTHER): Payer: Medicare PPO | Admitting: Nurse Practitioner

## 2022-11-27 ENCOUNTER — Encounter: Payer: Self-pay | Admitting: Family Medicine

## 2022-11-27 ENCOUNTER — Ambulatory Visit: Payer: Self-pay

## 2022-11-27 VITALS — Temp 101.0°F | Wt 199.0 lb

## 2022-11-27 DIAGNOSIS — R062 Wheezing: Secondary | ICD-10-CM | POA: Diagnosis not present

## 2022-11-27 DIAGNOSIS — U071 COVID-19: Secondary | ICD-10-CM | POA: Diagnosis not present

## 2022-11-27 DIAGNOSIS — Z8616 Personal history of COVID-19: Secondary | ICD-10-CM | POA: Insufficient documentation

## 2022-11-27 MED ORDER — BENZONATATE 200 MG PO CAPS: 200.0000 mg | ORAL_CAPSULE | Freq: Three times a day (TID) | ORAL | 0 refills | Status: DC | PRN

## 2022-11-27 MED ORDER — ALBUTEROL SULFATE HFA 108 (90 BASE) MCG/ACT IN AERS: 1.0000 | INHALATION_SPRAY | RESPIRATORY_TRACT | 3 refills | Status: DC | PRN

## 2022-11-27 MED ORDER — NIRMATRELVIR/RITONAVIR (PAXLOVID)TABLET: 3.0000 | ORAL_TABLET | Freq: Two times a day (BID) | ORAL | 0 refills | Status: AC

## 2022-11-27 NOTE — Progress Notes (Signed)
Start [id number] End time: 0904  Virtual Visit via Video Note  I connected with Phyllis Campbell on 11/27/22 by a video enabled telemedicine application and verified that I am speaking with the correct person using two identifiers.  Location: Patient: home Provider: office   I discussed the limitations of evaluation and management by telemedicine and the availability of in person appointments. The patient expressed understanding and agreed to proceed.  History of Present Illness:  Chief Complaint  Patient presents with   other    Tested positive for covid yesterday evening, chills body pain, bad cough, head ache, very weak, 101 fever    Covid Positive   Phyllis Campbell presents today with concerns regarding a recent positive test for COVID-19. She describes the onset of symptoms as having started yesterday, marking this as her first experience with the virus.  She details her symptoms, which include chills, body aches, a persistent cough, and headaches. Additionally, she reports a fever that has reached 101 degrees Fahrenheit.  Phyllis Campbell also notes respiratory symptoms, such as shortness of breath and wheezing, accompanied by a general sense of weakness when ambulating. She confirms that she is not experiencing any gastrointestinal symptoms, specifically denying the presence of nausea, vomiting, or diarrhea. She is not short of breath at rest.  She is at home with her daughter, who is also on the visit with her.   PMH, PSH, SH reviewed   Observations/Objective:  Temp (!) 101 F (38.3 C)   Wt 199 lb (90.3 kg)   BMI 34.70 kg/m  Ill appearing in bed.  No distress at this time.  Speaking in complete sentences.   Assessment and Plan: Problem List Items Addressed This Visit     COVID-19 virus infection - Primary    Patient presents with a positive COVID-19 test and symptoms including chills, body aches, cough, headache, fever at 101F, shortness of breath, wheezing, and weakness.  Symptoms began yesterday. No prior history of COVID-19. No gastrointestinal symptoms. Kidney function is normal. She is on statin therapy.  Plan: - Prescribe Paxlovid for COVID-19 to prevent worsening of symptoms. - Hold Simvastatin while on antiviral medication. - Acetaminophen (Tylenol) 650 mg every 4-6 hours as needed for fever. - Ibuprofen 400 mg every 6-8 hours as needed for fever and body aches; alternate with acetaminophen if necessary. - Continue Coricidin as needed for symptom management  - Prescribe Albuterol inhaler, 1-2 puffs every 4 hours as needed for wheezing. - Prescribe Tessalon Perles (benzonatate) 100 mg, take one capsule three times daily as needed for cough. - Advise rest and increased fluid intake. - Instruct to seek medical attention if wheezing or shortness of breath worsens. - Provide information on patient assistance program for Paxlovid if cost is prohibitive. [employer] patient assistance information texted to patients phone number.  - Consider adding steroids if symptoms worsen.       Relevant Medications   nirmatrelvir/ritonavir (PAXLOVID) 20 x 150 MG & 10 x 100MG TABS   albuterol (VENTOLIN HFA) 108 (90 Base) MCG/ACT inhaler   benzonatate (TESSALON) 200 MG capsule   Other Visit Diagnoses     Wheeze       Relevant Medications   nirmatrelvir/ritonavir (PAXLOVID) 20 x 150 MG & 10 x 100MG TABS        Follow Up Instructions: F/U if symptoms worsen or fail to improve.    I discussed the assessment and treatment plan with the patient. The patient was provided an opportunity to ask questions and all  were answered. The patient agreed with the plan and demonstrated an understanding of the instructions.   The patient was advised to call back or seek an in-person evaluation if the symptoms worsen or if the condition fails to improve as anticipated.  I spent 23 minutes dedicated to the care of this patient, including pre-visit review of records, face to face time,  post-visit ordering of testing and documentation.    Orma Render, NP

## 2022-11-27 NOTE — Assessment & Plan Note (Signed)
Patient presents with a positive COVID-19 test and symptoms including chills, body aches, cough, headache, fever at 101F, shortness of breath, wheezing, and weakness. Symptoms began yesterday. No prior history of COVID-19. No gastrointestinal symptoms. Kidney function is normal. She is on statin therapy.  Plan: - Prescribe Paxlovid for COVID-19 to prevent worsening of symptoms. - Hold Simvastatin while on antiviral medication. - Acetaminophen (Tylenol) 650 mg every 4-6 hours as needed for fever. - Ibuprofen 400 mg every 6-8 hours as needed for fever and body aches; alternate with acetaminophen if necessary. - Continue Coricidin as needed for symptom management  - Prescribe Albuterol inhaler, 1-2 puffs every 4 hours as needed for wheezing. - Prescribe Tessalon Perles (benzonatate) 100 mg, take one capsule three times daily as needed for cough. - Advise rest and increased fluid intake. - Instruct to seek medical attention if wheezing or shortness of breath worsens. - Provide information on patient assistance program for Paxlovid if cost is prohibitive. [employer] patient assistance information texted to patients phone number.  - Consider adding steroids if symptoms worsen.

## 2022-11-27 NOTE — Patient Instructions (Signed)
Hold simvastatin while taking the Paxlovid.   You can take the coricidin while taking the other medication.   Use the albuterol inhaler to help with wheezing.   If your shortness of breath gets worse or if you have new symptoms, please let us know or seek emergency care.   Use Tessalon Perles for the cough.    Go to [email or web address] to register for paxlovid patient assistance if this is not covered under your Medicare.

## 2022-12-06 ENCOUNTER — Ambulatory Visit: Payer: Medicare PPO | Admitting: Family Medicine

## 2022-12-30 ENCOUNTER — Other Ambulatory Visit: Payer: Self-pay | Admitting: Family Medicine

## 2022-12-30 DIAGNOSIS — I152 Hypertension secondary to endocrine disorders: Secondary | ICD-10-CM

## 2022-12-30 DIAGNOSIS — E1169 Type 2 diabetes mellitus with other specified complication: Secondary | ICD-10-CM

## 2023-02-19 ENCOUNTER — Ambulatory Visit: Payer: Medicare PPO | Admitting: Medical

## 2023-02-19 VITALS — BP 120/70 | HR 68 | Wt 202.6 lb

## 2023-02-19 DIAGNOSIS — M79602 Pain in left arm: Secondary | ICD-10-CM | POA: Insufficient documentation

## 2023-02-19 DIAGNOSIS — R202 Paresthesia of skin: Secondary | ICD-10-CM | POA: Insufficient documentation

## 2023-02-19 DIAGNOSIS — R29898 Other symptoms and signs involving the musculoskeletal system: Secondary | ICD-10-CM | POA: Insufficient documentation

## 2023-02-19 DIAGNOSIS — M542 Cervicalgia: Secondary | ICD-10-CM | POA: Insufficient documentation

## 2023-02-19 NOTE — Progress Notes (Signed)
Subjective:  Phyllis Campbell is a 89 y.o. female who presents for Chief Complaint  Patient presents with   left hand    Left hand- index and thumb are numb and can't pick up anything. She said she saw Dr. Susann Givens for this. Her left side feel numb and her legs will get cold and thought she was being set up for specialist     Here for left hand pain.   Left thumb and finger have been hurting for a while, for months, and feel numb. Lately though feels like left arm in general having problems, weakness, and numbness or tingling in hand.  Whole arm gets numb at times.  Has neck pain in general.  Left arm generalized pains ongoing with arthritic for years.   Sometimes drops cups.  Is right handed.  No recent vision changes, slurred speech, hearing change or other new weakness, numbness or tingling.   No falls lately.  Last fall June 2023.  Lives at home with daughter and her husband.  No other aggravating or relieving factors.    No other c/o.  Past Medical History:  Diagnosis Date   Allergy    Arthritis    Cataracts, bilateral    Diabetes mellitus    DVT (deep venous thrombosis) (HCC)    Dyslipidemia    HH (hiatus hernia)    Hypertension    Obesity    Postmenopausal    Stroke (HCC)    Thyromegaly    Current Outpatient Medications on File Prior to Visit  Medication Sig Dispense Refill   albuterol (VENTOLIN HFA) 108 (90 Base) MCG/ACT inhaler Inhale 1-2 puffs into the lungs every 4 (four) hours as needed for wheezing or shortness of breath. 8 g 3   alendronate (FOSAMAX) 70 MG tablet TAKE 1 TABLET(70 MG) BY MOUTH EVERY 7 DAYS WITH A FULL GLASS OF WATER AND ON AN EMPTY STOMACH 12 tablet 3   aspirin EC 81 MG tablet Take 81 mg by mouth every evening.     bisacodyl (DULCOLAX) 5 MG EC tablet Take 1 tablet (5 mg total) by mouth daily as needed for moderate constipation. 30 tablet 1   Calcium Carb-Cholecalciferol (CALCIUM 600+D3 PO) Take 1 tablet by mouth daily.     Chlorpheniramine-DM  (CORICIDIN HBP COUGH/COLD PO) Take 1 tablet by mouth every 8 (eight) hours as needed (for cold symptoms).     HYDROcodone-acetaminophen (NORCO/VICODIN) 5-325 MG tablet Take 1 tablet by mouth every 6 (six) hours as needed for moderate pain. 20 tablet 0   losartan-hydrochlorothiazide (HYZAAR) 50-12.5 MG tablet TAKE 1 TABLET EVERY DAY 90 tablet 3   meclizine (ANTIVERT) 12.5 MG tablet TAKE 1 TABLET BY MOUTH THREE TIMES DAILY AS NEEDED FOR DIZZINESS 30 tablet 0   polyethylene glycol (MIRALAX / GLYCOLAX) 17 g packet Take 17 g by mouth daily. 14 each 0   Pseudoephedrine-Ibuprofen (ADVIL COLD/SINUS PO) Take 1 tablet by mouth every 8 (eight) hours as needed (for congestion).     RESTASIS 0.05 % ophthalmic emulsion Place 1 drop into both eyes as needed. (Patient taking differently: Place 1 drop into both eyes in the morning and at bedtime.) 1.5 mL 1   simvastatin (ZOCOR) 40 MG tablet TAKE 1 TABLET EVERY DAY 90 tablet 3   Accu-Chek Softclix Lancets lancets 1 each by Other route daily. Use as instructed 100 each 3   glucose blood test strip 1 each by Other route as needed. Use as instructed 100 each 3   No current facility-administered  medications on file prior to visit.   Past Surgical History:  Procedure Laterality Date   ABDOMINAL HYSTERECTOMY     cataracts     CESAREAN SECTION     COLONOSCOPY  2004   EYE SURGERY     SHOULDER SURGERY       The following portions of the patient's history were reviewed and updated as appropriate: allergies, current medications, past family history, past medical history, past social history, past surgical history and problem list.  ROS Otherwise as in subjective above  Objective: BP 120/70   Pulse 68   Wt 202 lb 9.6 oz (91.9 kg)   BMI 35.33 kg/m   General appearance: alert, no distress, well developed, well nourished Neck: supple, neck extension and left rotation is reduced compared to the rest of the range of motion seems within normal limits, nontender  neck, no lymphadenopathy, no thyromegaly, no masses Upper back nontender, no deformity Arms nontender with normal range of motion in general without swelling or deformity She has obvious weakness of the left thumb and index finger and generalized weakness of the arm subtly compared to the right.  She has 4/5 strength of the left thumb and index finger with both flexion and extension compared to the rest of fingers on both hands.  Overall of the left hand grip strength is weaker than the right.  The left upper arm also seems to be somewhat weaker compared to the right.  Sensation overall of the hands and fingers seems okay.  DTRs normal both arms Pulses: 2+ radial pulses, 2+ pedal pulses, normal cap refill Ext: no edema   Assessment: Encounter Diagnoses  Name Primary?   Left hand weakness Yes   Left arm pain    Arm paresthesia, left    Neck pain    Decreased ROM of neck      Plan: Discussed symptoms, concerns, findings.  Definite weakness of left hand, and specifically left thumb and finger weakness.  Referral urgently to occupational therapy, and refer to ortho as well for further eval and treatment.  Zylah was seen today for left hand.  Diagnoses and all orders for this visit:  Left hand weakness -     Ambulatory referral to Orthopedics -     Ambulatory referral to Occupational Therapy  Left arm pain -     Ambulatory referral to Orthopedics -     Ambulatory referral to Occupational Therapy  Arm paresthesia, left -     Ambulatory referral to Orthopedics -     Ambulatory referral to Occupational Therapy  Neck pain -     Ambulatory referral to Orthopedics -     Ambulatory referral to Occupational Therapy  Decreased ROM of neck -     Ambulatory referral to Orthopedics -     Ambulatory referral to Occupational Therapy    Follow up: pending referral

## 2023-02-21 ENCOUNTER — Other Ambulatory Visit: Payer: Self-pay

## 2023-02-21 ENCOUNTER — Ambulatory Visit: Payer: Medicare PPO | Attending: Medical

## 2023-02-21 ENCOUNTER — Ambulatory Visit: Payer: Medicare PPO

## 2023-02-21 DIAGNOSIS — R202 Paresthesia of skin: Secondary | ICD-10-CM | POA: Diagnosis not present

## 2023-02-21 DIAGNOSIS — M79642 Pain in left hand: Secondary | ICD-10-CM

## 2023-02-21 DIAGNOSIS — R278 Other lack of coordination: Secondary | ICD-10-CM | POA: Diagnosis not present

## 2023-02-21 DIAGNOSIS — R29898 Other symptoms and signs involving the musculoskeletal system: Secondary | ICD-10-CM | POA: Insufficient documentation

## 2023-02-21 DIAGNOSIS — M79602 Pain in left arm: Secondary | ICD-10-CM | POA: Insufficient documentation

## 2023-02-21 DIAGNOSIS — M6281 Muscle weakness (generalized): Secondary | ICD-10-CM | POA: Diagnosis not present

## 2023-02-21 DIAGNOSIS — M542 Cervicalgia: Secondary | ICD-10-CM | POA: Diagnosis not present

## 2023-02-21 NOTE — Patient Instructions (Signed)
Towel Roll Squeeze    With left forearm resting on surface, walk each finger to get towel balled up in hand then gently squeeze towel. Repeat __10__ times per set. Do __3__ sets per session. Do __1__ sessions per day.  Opposition (Active)    Touch tip of thumb to nail tip of each finger in turn, making an "O" shape. Repeat __10__ times. Do __2__ sessions per day.

## 2023-02-21 NOTE — Therapy (Signed)
OUTPATIENT OCCUPATIONAL THERAPY NEURO EVALUATION  Patient Name: Phyllis Campbell MRN: [id number] DOB:1932/05/15, 89 y.o., female Today's Date: 02/21/2023  PCP: Ronnald Nian., MD REFERRING PROVIDER: Jac Canavan, PA-C  END OF SESSION:  OT End of Session - 02/21/23 1016     Visit Number 1    Number of Visits 12    Authorization Type [employer]    OT Start Time 0930    OT Stop Time 1016    OT Time Calculation (min) 46 min    Activity Tolerance Patient tolerated treatment well    Behavior During Therapy WFL for tasks assessed/performed             Past Medical History:  Diagnosis Date   Allergy    Arthritis    Cataracts, bilateral    Diabetes mellitus    DVT (deep venous thrombosis) (HCC)    Dyslipidemia    HH (hiatus hernia)    Hypertension    Obesity    Postmenopausal    Stroke (HCC)    Thyromegaly    Past Surgical History:  Procedure Laterality Date   ABDOMINAL HYSTERECTOMY     cataracts     CESAREAN SECTION     COLONOSCOPY  2004   EYE SURGERY     SHOULDER SURGERY     Patient Active Problem List   Diagnosis Date Noted   Left hand weakness 02/19/2023   Left arm pain 02/19/2023   Arm paresthesia, left 02/19/2023   Decreased ROM of neck 02/19/2023   Neck pain 02/19/2023   COVID-19 virus infection 11/27/2022   Hypokalemia 04/02/2022   PAD (peripheral artery disease) (HCC) 04/02/2022   Gastroesophageal reflux disease without esophagitis 05/17/2020   Obesity (BMI 30-39.9) 10/13/2019   Degenerative lumbar spinal stenosis 10/13/2019   Osteoporosis 09/19/2018   Lumbar radiculopathy 06/03/2018   Atherosclerosis of native arteries of extremity with intermittent claudication (HCC) 03/13/2012   Hypertension complicating diabetes (HCC) 07/09/2011   Allergic rhinitis due to pollen 07/09/2011   DM (diabetes mellitus) with complications (HCC) 07/09/2011   Hyperlipidemia associated with type 2 diabetes mellitus (HCC) 07/09/2011   Arthritis 07/09/2011     ONSET DATE: 02/19/2023 (referral date)  REFERRING DIAG: R29.898 (ICD-10-CM) - Left hand weakness M79.602 (ICD-10-CM) - Left arm pain R20.2 (ICD-10-CM) - Arm paresthesia, left M54.2 (ICD-10-CM) - Neck pain R29.898 (ICD-10-CM) - Decreased ROM of neck  THERAPY DIAG:  Other lack of coordination  Muscle weakness (generalized)  Pain in left hand  Weakness of left hand  Rationale for Evaluation and Treatment: Rehabilitation  SUBJECTIVE:   SUBJECTIVE STATEMENT: Pt reports suffering with symptoms for quite a while, but has worsening over last 2 months. Pt reports she has an ortho appt to have some follow-up, but unsure as to what.  Pt accompanied by: self  PERTINENT HISTORY: allergy, arthritis, cataracts, DM, DVT, HTN, obesity, stroke (pt denied), pt reports carpal tunnel syndrome right side, pt reports vertigo and scoliosis  PRECAUTIONS: Fall  WEIGHT BEARING RESTRICTIONS: No  PAIN:  Are you having pain? Yes: NPRS scale: 5/10 Pain location: LUE from shoulder to hand Pain description: "Just hurts" Aggravating factors: Looking over left shoulder Relieving factors: Laying down  FALLS: Has patient fallen in last 6 months? No - had fall June 2023, hospitalized and had LUE pain at that time, but resolved.   LIVING ENVIRONMENT: Lives with: lives with their daughter Lives in: House/apartment - one level Stairs: Yes: External: 2 steps; can reach both at back entrance Has following equipment  at home: Single point cane, Walker - 2 wheeled, shower chair, bed side commode, and pt describes cardiac walker as well. Bathroom setup: Tub/shower combo, handicapped height toilet  PLOF: Independent with basic ADLs and driving, ambulates with hurrycane, enjoys fishing  PATIENT GOALS: Baiting my fish  OBJECTIVE:   HAND DOMINANCE: Right  ADLs: Eating: Independent self-feeding, daughter cuts up food Grooming: Independent for washing face, brushing teeth, daughter helping with hair care UB  Dressing: Pending on the shirt, pt needs assistance LB Dressing: Daughter assists, which is typical beyond the hand complaint (baseline) Toileting: Pending on clothes pt is wearing, pt needs assistance Bathing: Daughter assists, which is typical beyond hand complaint (baseline) Tub Shower transfers: Supervision at baseline Equipment: Shower seat with back  IADLs: Shopping: Daughter manages (baseline) Light housekeeping: Pt does laundry independently Meal Prep: Daughter currently Health visitor mobility: Hurrycane at all times Medication management: Independent if bottles not too tight, pt gets easy-open bottles Financial management: Daughter manages (baseline) Handwriting: 100% legible  MOBILITY STATUS:  Grossly independent with hurrycane  POSTURE COMMENTS:  rounded shoulders Sitting balance: Moves/returns truncal midpoint 1-2 inches in multiple planes  ACTIVITY TOLERANCE: Activity tolerance: Pt uses motorized carts at stores  FUNCTIONAL OUTCOME MEASURES: Quick Dash: 75%  - Higher score indicates more disability in daily activity   UPPER EXTREMITY ROM:    Active ROM Right Eval (hx of rotator cuff injury) Left eval  Shoulder flexion 100* 120*  Shoulder abduction 75* 110*  Shoulder adduction    Shoulder extension    Shoulder internal rotation    Shoulder external rotation    Elbow flexion [facility] WFL  Elbow extension [facility] WFL  Wrist flexion [facility] WFL  Wrist extension    Wrist ulnar deviation    Wrist radial deviation    Wrist pronation [facility] WFL  Wrist supination WFL WFL  (Blank rows = not tested)  UPPER EXTREMITY MMT:     MMT Right eval Left eval  Shoulder flexion 3+ 4-  Shoulder abduction    Shoulder adduction    Shoulder extension    Shoulder internal rotation    Shoulder external rotation    Middle trapezius    Lower trapezius    Elbow flexion 4/5 4/5  Elbow extension 4/5 4/5  Wrist flexion    Wrist extension    Wrist ulnar deviation    Wrist radial  deviation    Wrist pronation    Wrist supination    (Blank rows = not tested)  HAND FUNCTION: Grip strength: Right: 52.6 lbs; Left: 38.8 lbs and Lateral pinch: Right: 14 lbs, Left: 4 lbs  COORDINATION: 9 Hole Peg test: Right: 26 sec; Left: 49 sec  SENSATION: Light touch: Impaired on left hand compared to right Phoebe Perch - will assess next visit  EDEMA: None  COGNITION: Overall cognitive status: Within functional limits for tasks assessed  VISION: Subjective report: Wears reading glasses sometimes Baseline vision: Wears glasses for reading only Visual history: cataracts  VISION ASSESSMENT: WFL  Patient has difficulty with following activities due to following visual impairments: Reading materials without glasses  PERCEPTION: WFL  OBSERVATIONS: Pt ambulatory with hurrycane with reduced arm swing LUE. Pt with slower gait pattern. Well-kept.   TODAY'S TREATMENT:  DATE:    Pt provided with left hand HEP today to include towel squeezes and digit-to-thumb opposition. Pt requiring verbal, tactile and visual cues for correct form.   PATIENT EDUCATION: Education details: OT POC, left hand HEP Person educated: Patient Education method: Explanation, Demonstration, Verbal cues, and Handouts Education comprehension: verbalized understanding, returned demonstration, and verbal cues required  HOME EXERCISE PROGRAM: 02/21/23: Towel squeezes, digit-to-thumb opposition   GOALS: Goals reviewed with patient? Yes  SHORT TERM GOALS: Target date: 03/14/2023   Pt will be independent in LUE HEP. Baseline: initiated Goal status: INITIAL  2.  Pt will demonstrate improved LEFT fine motor coordination for ADLs as evidenced by decreasing 9 hole peg test score for by 3 secs  Baseline: 49 sec Goal status: INITIAL  3.  Pt will demonstrate improved LEFT grip  strength for IADLs as evidenced by increasing grip strength by 5 lbs. Baseline: 38.8 lbs Goal status: INITIAL  4.  Pt will verbalize at least 3 fall prevention strategies independently. Baseline:  Goal status: INITIAL  5.  Pt will demonstrate improved LEFT lateral pinch strength required for leisure activity by 2 lbs. Baseline: 4 lbs Goal status: INITIAL   LONG TERM GOALS: Target date: 04/04/2023   Patient will demonstrate at least 10% improvement with quick Dash score (reporting 65% disability or less) indicating improved functional use of affected extremity. Baseline: 75% Goal status: INITIAL  2.  Pt will demonstrate improved LEFT fine motor coordination for ADLs as evidenced by decreasing 9 hole peg test score by 10 secs  Baseline: 49 secs Goal status: INITIAL  3.  Pt will demonstrate improved LEFT grip strength for IADLs as evidenced by increasing grip strength by 10 lbs. Baseline: 38.8 lbs Goal status: INITIAL  4.  Pt will verbalize at least 2 compensatory methods, to include adaptive equipment, to utilize for IADL management independently. Baseline:  Goal status: INITIAL  5.  Pt will demonstrate improved LEFT lateral pinch strength required for leisure activity by 5 lbs. Baseline: 4 lbs Goal status: INITIAL   ASSESSMENT:  CLINICAL IMPRESSION: Patient is a 89 y.o. female who was seen today for occupational therapy evaluation for left hand weakness. Hx of stroke, scoliosis, falls, and HTN. Pt reports worsening symptoms over last 2 months. Pt reports left hand weakness, pain, and reduced sensation. Pt has had a change in typical habits and routines due to this, and limited in ADL/IADLs as well. Pt would benefit from skilled OT services to address performance deficits as listed below, and to maximize independence in functional activity.  PERFORMANCE DEFICITS: in functional skills including ADLs, IADLs, coordination, dexterity, sensation, ROM, strength, pain, Fine motor  control, mobility, balance, endurance, and UE functional use, cognitive skills including memory, and psychosocial skills including habits.   IMPAIRMENTS: are limiting patient from ADLs, IADLs, leisure, and social participation.   CO-MORBIDITIES: may have co-morbidities  that affects occupational performance. Patient will benefit from skilled OT to address above impairments and improve overall function.  MODIFICATION OR ASSISTANCE TO COMPLETE EVALUATION: No modification of tasks or assist necessary to complete an evaluation.  OT OCCUPATIONAL PROFILE AND HISTORY: Detailed assessment: Review of records and additional review of physical, cognitive, psychosocial history related to current functional performance.  CLINICAL DECISION MAKING: LOW - limited treatment options, no task modification necessary  REHAB POTENTIAL: Good  EVALUATION COMPLEXITY: Low    PLAN:  OT FREQUENCY: 2x/week  OT DURATION: 6 weeks  PLANNED INTERVENTIONS: self care/ADL training, therapeutic exercise, therapeutic activity, neuromuscular re-education, manual therapy, passive  range of motion, balance training, functional mobility training, splinting, electrical stimulation, ultrasound, paraffin, fluidotherapy, moist heat, cryotherapy, contrast bath, patient/family education, energy conservation, coping strategies training, DME and/or AE instructions, and Re-evaluation  RECOMMENDED OTHER SERVICES: None currently  CONSULTED AND AGREED WITH PLAN OF CARE: Patient  PLAN FOR NEXT SESSION: Review HEP, test sensation, LUE HEP   [employer], OT 02/21/2023, 11:19 AM

## 2023-02-22 ENCOUNTER — Ambulatory Visit: Payer: Medicare PPO | Admitting: Orthopaedic Surgery

## 2023-02-22 DIAGNOSIS — G5602 Carpal tunnel syndrome, left upper limb: Secondary | ICD-10-CM | POA: Diagnosis not present

## 2023-02-22 MED ORDER — METHYLPREDNISOLONE ACETATE 40 MG/ML IJ SUSP
40.0000 mg | INTRAMUSCULAR | Status: AC | PRN
Start: 2023-02-22 — End: 2023-02-22
  Administered 2023-02-22: 40 mg

## 2023-02-22 MED ORDER — BUPIVACAINE HCL 0.5 % IJ SOLN
1.0000 mL | INTRAMUSCULAR | Status: AC | PRN
Start: 2023-02-22 — End: 2023-02-22
  Administered 2023-02-22: 1 mL

## 2023-02-22 MED ORDER — LIDOCAINE HCL 1 % IJ SOLN
1.0000 mL | INTRAMUSCULAR | Status: AC | PRN
Start: 2023-02-22 — End: 2023-02-22
  Administered 2023-02-22: 1 mL

## 2023-02-22 NOTE — Progress Notes (Signed)
Office Visit Note   Patient: Phyllis Campbell           Date of Birth: October 26, 1931           MRN: [id number] Visit Date: 02/22/2023              Requested by: Jac Canavan, PA-C [address] [place],  [place] [address] PCP: Ronnald Nian, MD   Assessment & Plan: Visit Diagnoses:  1. Left carpal tunnel syndrome     Plan: Impression is left carpal tunnel syndrome.  Disease process reviewed and findings explained and treatment options were discussed in detail.  She would like to try a steroid injection and a carpal tunnel splint to wear at night.  She will follow-up if symptoms do not improve.  Follow-Up Instructions: No follow-ups on file.   Orders:  No orders of the defined types were placed in this encounter.  No orders of the defined types were placed in this encounter.     Procedures: Hand/UE Inj: L carpal tunnel for carpal tunnel syndrome on 02/22/2023 4:02 PM Details: 25 G needle Medications: 1 mL lidocaine 1 %; 1 mL bupivacaine 0.5 %; 40 mg methylPREDNISolone acetate 40 MG/ML Outcome: tolerated well, no immediate complications Patient was prepped and draped in the usual sterile fashion.       Clinical Data: No additional findings.   Subjective: Chief Complaint  Patient presents with   Left Arm - Weakness    HPI Phyllis Campbell is a very pleasant 89 year old female here for evaluation of left arm and hand numbness and tingling with decreased strength.  Has a history of right carpal tunnel syndrome which improved and resolved with bracing.  The numbness is worse in the thumb and index fingers. Review of Systems  Constitutional: Negative.   HENT: Negative.    Eyes: Negative.   Respiratory: Negative.    Cardiovascular: Negative.   Endocrine: Negative.   Musculoskeletal: Negative.   Neurological: Negative.   Hematological: Negative.   Psychiatric/Behavioral: Negative.    All other systems reviewed and are negative.    Objective: Vital Signs: There  were no vitals taken for this visit.  Physical Exam Vitals and nursing note reviewed.  Constitutional:      Appearance: She is well-developed.  HENT:     Head: Atraumatic.     Nose: Nose normal.  Eyes:     Extraocular Movements: Extraocular movements intact.  Cardiovascular:     Pulses: Normal pulses.  Pulmonary:     Effort: Pulmonary effort is normal.  Abdominal:     Palpations: Abdomen is soft.  Musculoskeletal:     Cervical back: Neck supple.  Skin:    General: Skin is warm.     Capillary Refill: Capillary refill takes less than 2 seconds.  Neurological:     Mental Status: She is alert. Mental status is at baseline.  Psychiatric:        Behavior: Behavior normal.        Thought Content: Thought content normal.        Judgment: Judgment normal.     Ortho Exam Examined patient left hand shows flattening of the thenar compartment.  She has no carpal tunnel compressive signs.  Subjective paresthesias in the fingertips. Specialty Comments:  No specialty comments available.  Imaging: No results found.   PMFS History: Patient Active Problem List   Diagnosis Date Noted   Left hand weakness 02/19/2023   Left arm pain 02/19/2023   Arm paresthesia, left  02/19/2023   Decreased ROM of neck 02/19/2023   Neck pain 02/19/2023   COVID-19 virus infection 11/27/2022   Hypokalemia 04/02/2022   PAD (peripheral artery disease) (HCC) 04/02/2022   Gastroesophageal reflux disease without esophagitis 05/17/2020   Obesity (BMI 30-39.9) 10/13/2019   Degenerative lumbar spinal stenosis 10/13/2019   Osteoporosis 09/19/2018   Lumbar radiculopathy 06/03/2018   Atherosclerosis of native arteries of extremity with intermittent claudication (HCC) 03/13/2012   Hypertension complicating diabetes (HCC) 07/09/2011   Allergic rhinitis due to pollen 07/09/2011   DM (diabetes mellitus) with complications (HCC) 07/09/2011   Hyperlipidemia associated with type 2 diabetes mellitus (HCC) 07/09/2011    Arthritis 07/09/2011   Past Medical History:  Diagnosis Date   Allergy    Arthritis    Cataracts, bilateral    Diabetes mellitus    DVT (deep venous thrombosis) (HCC)    Dyslipidemia    HH (hiatus hernia)    Hypertension    Obesity    Postmenopausal    Stroke (HCC)    Thyromegaly     No family history on file.  Past Surgical History:  Procedure Laterality Date   ABDOMINAL HYSTERECTOMY     cataracts     CESAREAN SECTION     COLONOSCOPY  2004   EYE SURGERY     SHOULDER SURGERY     Social History   Occupational History   Not on file  Tobacco Use   Smoking status: Never    Passive exposure: Never   Smokeless tobacco: Never  Vaping Use   Vaping Use: Never used  Substance and Sexual Activity   Alcohol use: No   Drug use: No   Sexual activity: Not Currently

## 2023-02-25 ENCOUNTER — Ambulatory Visit: Payer: Medicare PPO

## 2023-02-25 DIAGNOSIS — R29898 Other symptoms and signs involving the musculoskeletal system: Secondary | ICD-10-CM

## 2023-02-25 DIAGNOSIS — M6281 Muscle weakness (generalized): Secondary | ICD-10-CM

## 2023-02-25 DIAGNOSIS — M79642 Pain in left hand: Secondary | ICD-10-CM | POA: Diagnosis not present

## 2023-02-25 DIAGNOSIS — M542 Cervicalgia: Secondary | ICD-10-CM | POA: Diagnosis not present

## 2023-02-25 DIAGNOSIS — R278 Other lack of coordination: Secondary | ICD-10-CM

## 2023-02-25 DIAGNOSIS — M79602 Pain in left arm: Secondary | ICD-10-CM | POA: Diagnosis not present

## 2023-02-25 DIAGNOSIS — R202 Paresthesia of skin: Secondary | ICD-10-CM | POA: Diagnosis not present

## 2023-02-25 NOTE — Therapy (Signed)
OUTPATIENT OCCUPATIONAL THERAPY ORTHO TREATMENT  Patient Name: Phyllis Campbell MRN: [id number] DOB:Oct 19, 1931, 89 y.o., female Today's Date: 02/25/2023  PCP: Ronnald Nian., MD REFERRING PROVIDER: Jac Canavan, PA-C  END OF SESSION:  OT End of Session - 02/25/23 0933     Visit Number 2    Number of Visits 12    Authorization Type [employer]    OT Start Time 0933    OT Stop Time 1013    OT Time Calculation (min) 40 min    Activity Tolerance Patient tolerated treatment well    Behavior During Therapy WFL for tasks assessed/performed             Past Medical History:  Diagnosis Date   Allergy    Arthritis    Cataracts, bilateral    Diabetes mellitus    DVT (deep venous thrombosis) (HCC)    Dyslipidemia    HH (hiatus hernia)    Hypertension    Obesity    Postmenopausal    Stroke (HCC)    Thyromegaly    Past Surgical History:  Procedure Laterality Date   ABDOMINAL HYSTERECTOMY     cataracts     CESAREAN SECTION     COLONOSCOPY  2004   EYE SURGERY     SHOULDER SURGERY     Patient Active Problem List   Diagnosis Date Noted   Left hand weakness 02/19/2023   Left arm pain 02/19/2023   Arm paresthesia, left 02/19/2023   Decreased ROM of neck 02/19/2023   Neck pain 02/19/2023   COVID-19 virus infection 11/27/2022   Hypokalemia 04/02/2022   PAD (peripheral artery disease) (HCC) 04/02/2022   Gastroesophageal reflux disease without esophagitis 05/17/2020   Obesity (BMI 30-39.9) 10/13/2019   Degenerative lumbar spinal stenosis 10/13/2019   Osteoporosis 09/19/2018   Lumbar radiculopathy 06/03/2018   Atherosclerosis of native arteries of extremity with intermittent claudication (HCC) 03/13/2012   Hypertension complicating diabetes (HCC) 07/09/2011   Allergic rhinitis due to pollen 07/09/2011   DM (diabetes mellitus) with complications (HCC) 07/09/2011   Hyperlipidemia associated with type 2 diabetes mellitus (HCC) 07/09/2011   Arthritis 07/09/2011     ONSET DATE: 02/19/2023 (referral date)  REFERRING DIAG: R29.898 (ICD-10-CM) - Left hand weakness M79.602 (ICD-10-CM) - Left arm pain R20.2 (ICD-10-CM) - Arm paresthesia, left M54.2 (ICD-10-CM) - Neck pain R29.898 (ICD-10-CM) - Decreased ROM of neck  THERAPY DIAG:  Other lack of coordination  Muscle weakness (generalized)  Pain in left hand  Weakness of left hand  Rationale for Evaluation and Treatment: Rehabilitation  SUBJECTIVE:   SUBJECTIVE STATEMENT: Pt reports seeing orthopedic physician on Friday and her hand is doing much better. She reports it feels like it is back to normal.  Pt accompanied by: self  PERTINENT HISTORY: allergy, arthritis, cataracts, DM, DVT, HTN, obesity, stroke (pt denied), pt reports carpal tunnel syndrome right side, pt reports vertigo and scoliosis  PRECAUTIONS: Fall  WEIGHT BEARING RESTRICTIONS: No  PAIN:  Are you having pain? Yes: NPRS scale: 5/10 Pain location: LUE from shoulder to hand Pain description: "Just hurts" Aggravating factors: Looking over left shoulder Relieving factors: Laying down  FALLS: Has patient fallen in last 6 months? No - had fall June 2023, hospitalized and had LUE pain at that time, but resolved.   LIVING ENVIRONMENT: Lives with: lives with their daughter Lives in: House/apartment - one level Stairs: Yes: External: 2 steps; can reach both at back entrance Has following equipment at home: Single point cane, Walker - 2  wheeled, shower chair, bed side commode, and pt describes cardiac walker as well. Bathroom setup: Tub/shower combo, handicapped height toilet  PLOF: Independent with basic ADLs and driving, ambulates with hurrycane, enjoys fishing  PATIENT GOALS: Baiting my fish  OBJECTIVE:   HAND DOMINANCE: Right  ADLs: Eating: Independent self-feeding, daughter cuts up food Grooming: Independent for washing face, brushing teeth, daughter helping with hair care UB Dressing: Pending on the shirt, pt needs  assistance LB Dressing: Daughter assists, which is typical beyond the hand complaint (baseline) Toileting: Pending on clothes pt is wearing, pt needs assistance Bathing: Daughter assists, which is typical beyond hand complaint (baseline) Tub Shower transfers: Supervision at baseline Equipment: Shower seat with back  IADLs: Shopping: Daughter manages (baseline) Light housekeeping: Pt does laundry independently Meal Prep: Daughter currently Health visitor mobility: Hurrycane at all times Medication management: Independent if bottles not too tight, pt gets easy-open bottles Financial management: Daughter manages (baseline) Handwriting: 100% legible  MOBILITY STATUS:  Grossly independent with hurrycane  POSTURE COMMENTS:  rounded shoulders Sitting balance: Moves/returns truncal midpoint 1-2 inches in multiple planes  ACTIVITY TOLERANCE: Activity tolerance: Pt uses motorized carts at stores  FUNCTIONAL OUTCOME MEASURES: Quick Dash: 75%  - Higher score indicates more disability in daily activity   UPPER EXTREMITY ROM:    Active ROM Right Eval (hx of rotator cuff injury) Left eval  Shoulder flexion 100* 120*  Shoulder abduction 75* 110*  Shoulder adduction    Shoulder extension    Shoulder internal rotation    Shoulder external rotation    Elbow flexion [facility] WFL  Elbow extension [facility] WFL  Wrist flexion [facility] WFL  Wrist extension    Wrist ulnar deviation    Wrist radial deviation    Wrist pronation [facility] WFL  Wrist supination WFL WFL  (Blank rows = not tested)  UPPER EXTREMITY MMT:     MMT Right eval Left eval  Shoulder flexion 3+ 4-  Shoulder abduction    Shoulder adduction    Shoulder extension    Shoulder internal rotation    Shoulder external rotation    Middle trapezius    Lower trapezius    Elbow flexion 4/5 4/5  Elbow extension 4/5 4/5  Wrist flexion    Wrist extension    Wrist ulnar deviation    Wrist radial deviation    Wrist pronation    Wrist  supination    (Blank rows = not tested)  HAND FUNCTION: Grip strength: Right: 52.6 lbs; Left: 38.8 lbs and Lateral pinch: Right: 14 lbs, Left: 4 lbs  COORDINATION: 9 Hole Peg test: Right: 26 sec; Left: 49 sec  SENSATION: Light touch: Impaired on left hand compared to right 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 Pt with slower gait pattern. Well-kept.   TODAY'S TREATMENT:  DATE:    Therapeutic exercise - Pt provided with median nerve glides following carpal tunnel syndrome diagnosis from orthopedic physician. Pt able to perform following exercises with teach back and return demo following education. Exercises - Hand AROM Hook Fist EDC Glide  - 1 x daily - 7 x weekly - 3 sets - 10 reps - Finger MP Flexion AROM  - 1 x daily - 7 x weekly - 3 sets - 10 reps - Hand AROM Flat Fist  - 1 x daily - 7 x weekly - 3 sets - 10 reps - Hand AROM Composite Flexion  - 1 x daily - 7 x weekly - 3 sets - 10 reps  Therapeutic activity - Pt then performing lateral pinch with weighted clothes pins up to green color x 2 trials. Pt requiring cues for hand placement. Reviewed goals as well today as patient reports feeling back to her normal, and apprehensive about returning. Education provided for goals to be addressed, strength  deficits remaining, and benefits for occupational therapy following carpal tunnel diagnosis. Pt receptive and willing to return for at least 1 more visit.  PATIENT EDUCATION: Education details: Median nerve glides left hand Person educated: Patient Education method: Explanation, Demonstration, Verbal cues, and Handouts Education comprehension: verbalized understanding, returned demonstration, and verbal cues required  HOME EXERCISE PROGRAM: 02/21/23: Towel squeezes, digit-to-thumb opposition Access Code: [email or web address] URL: [email or web address] Date: 02/25/2023 Prepared by: Wyn Forster Donye Dauenhauer   GOALS: Goals reviewed with patient? Yes  SHORT TERM GOALS: Target date: 03/14/2023   Pt will be independent in LUE HEP. Baseline: initiated Goal status: INITIAL  2.  Pt will demonstrate improved LEFT fine motor coordination for ADLs as evidenced by decreasing 9 hole peg test score for by 3 secs  Baseline: 49 sec Goal status: INITIAL  3.  Pt will demonstrate improved LEFT grip strength for IADLs as evidenced by increasing grip strength by 5 lbs. Baseline: 38.8 lbs 02/25/23: 41.8 Goal status: IN PROGRESS  4.  Pt will verbalize at least 3 fall prevention strategies independently. Baseline:  Goal status: INITIAL  5.  Pt will demonstrate improved LEFT lateral pinch strength required for leisure activity by 2 lbs. Baseline: 4 lbs 02/25/23: 8 lbs Goal status: MET   LONG TERM GOALS: Target date: 04/04/2023   Patient will demonstrate at least 10% improvement with quick Dash score (reporting 65% disability or less) indicating improved functional use of affected extremity. Baseline: 75% Goal status: INITIAL  2.  Pt will demonstrate improved LEFT fine motor coordination for ADLs as evidenced by decreasing 9 hole peg test score by 10 secs  Baseline: 49 secs Goal status: INITIAL  3.  Pt will demonstrate improved LEFT grip strength for IADLs as evidenced by increasing grip strength by 10  lbs. Baseline: 38.8 lbs 02/25/23: 41.8 lbs Goal status: IN PROGRESS  4.  Pt will verbalize at least 2 compensatory methods, to include adaptive equipment, to utilize for IADL management independently. Baseline:  02/25/23: Pt reports back to normal function and did not have to incorporate any strategies to complete Goal status: INITIAL  5.  Pt will demonstrate improved LEFT lateral pinch strength required for leisure activity by 5 lbs. Baseline: 4 lbs 02/25/23: 8 lbs Goal status: IN PROGRESS   ASSESSMENT:  CLINICAL IMPRESSION: Pt seen by orthopedic physician on Friday. Pt given injection and wrist brace to wear at night. Pt reports feeling back to normal and does feel she needs OT any further, but open to 1-2 more visits as needed. Pt  would benefit from skilled OT services to address strength and coordination deficits of left hand to improve independence in ADL/IADLs.  PERFORMANCE DEFICITS: in functional skills including ADLs, IADLs, coordination, dexterity, sensation, ROM, strength, pain, Fine motor control, mobility, balance, endurance, and UE functional use, cognitive skills including memory, and psychosocial skills including habits.   IMPAIRMENTS: are limiting patient from ADLs, IADLs, leisure, and social participation.   CO-MORBIDITIES: may have co-morbidities  that affects occupational performance. Patient will benefit from skilled OT to address above impairments and improve overall function.  MODIFICATION OR ASSISTANCE TO COMPLETE EVALUATION: No modification of tasks or assist necessary to complete an evaluation.  OT OCCUPATIONAL PROFILE AND HISTORY: Detailed assessment: Review of records and additional review of physical, cognitive, psychosocial history related to current functional performance.  CLINICAL DECISION MAKING: LOW - limited treatment options, no task modification necessary  REHAB POTENTIAL: Good  EVALUATION COMPLEXITY: Low    PLAN:  OT FREQUENCY: 2x/week  OT  DURATION: 6 weeks  PLANNED INTERVENTIONS: self care/ADL training, therapeutic exercise, therapeutic activity, neuromuscular re-education, manual therapy, passive range of motion, balance training, functional mobility training, splinting, electrical stimulation, ultrasound, paraffin, fluidotherapy, moist heat, cryotherapy, contrast bath, patient/family education, energy conservation, coping strategies training, DME and/or AE instructions, and Re-evaluation  RECOMMENDED OTHER SERVICES: None currently  CONSULTED AND AGREED WITH PLAN OF CARE: Patient  PLAN FOR NEXT SESSION: Review latest HEP, putty exercises, coordination   Devion Chriscoe M Marabeth Melland, OT 02/25/2023, 10:15 AM

## 2023-03-01 ENCOUNTER — Ambulatory Visit: Payer: Medicare PPO

## 2023-03-06 ENCOUNTER — Ambulatory Visit: Payer: Medicare PPO

## 2023-03-06 DIAGNOSIS — M79642 Pain in left hand: Secondary | ICD-10-CM

## 2023-03-06 DIAGNOSIS — M79602 Pain in left arm: Secondary | ICD-10-CM | POA: Diagnosis not present

## 2023-03-06 DIAGNOSIS — R278 Other lack of coordination: Secondary | ICD-10-CM

## 2023-03-06 DIAGNOSIS — M6281 Muscle weakness (generalized): Secondary | ICD-10-CM

## 2023-03-06 DIAGNOSIS — R29898 Other symptoms and signs involving the musculoskeletal system: Secondary | ICD-10-CM | POA: Diagnosis not present

## 2023-03-06 DIAGNOSIS — R202 Paresthesia of skin: Secondary | ICD-10-CM | POA: Diagnosis not present

## 2023-03-06 DIAGNOSIS — M542 Cervicalgia: Secondary | ICD-10-CM | POA: Diagnosis not present

## 2023-03-06 NOTE — Patient Instructions (Signed)
  Coordination Activities Perform the following activities for 10 minutes 1 times per day with left hand(s).  Rotate ball in fingertips (clockwise and counter-clockwise). Flip cards 1 at a time as fast as you can. Shuffle cards. Pick up coins and place in container or coin bank. Pick up coins one at a time until you get 5-10 in your hand, then move coins from palm to fingertips to stack one at a time. Twirl pen between fingers.

## 2023-03-06 NOTE — Therapy (Signed)
OUTPATIENT OCCUPATIONAL THERAPY ORTHO TREATMENT  Patient Name: Phyllis Campbell MRN: [id number] DOB:1932/05/15, 89 y.o., female Today's Date: 03/06/2023  PCP: Ronnald Nian., MD REFERRING PROVIDER: Jac Canavan, PA-C  END OF SESSION:  OT End of Session - 03/06/23 0759     Visit Number 3    Number of Visits 12    Authorization Type [employer]    OT Start Time 0800    OT Stop Time 0840    OT Time Calculation (min) 40 min    Equipment Utilized During Treatment yellow theraputty    Activity Tolerance Patient tolerated treatment well    Behavior During Therapy WFL for tasks assessed/performed             Past Medical History:  Diagnosis Date   Allergy    Arthritis    Cataracts, bilateral    Diabetes mellitus    DVT (deep venous thrombosis) (HCC)    Dyslipidemia    HH (hiatus hernia)    Hypertension    Obesity    Postmenopausal    Stroke (HCC)    Thyromegaly    Past Surgical History:  Procedure Laterality Date   ABDOMINAL HYSTERECTOMY     cataracts     CESAREAN SECTION     COLONOSCOPY  2004   EYE SURGERY     SHOULDER SURGERY     Patient Active Problem List   Diagnosis Date Noted   Left hand weakness 02/19/2023   Left arm pain 02/19/2023   Arm paresthesia, left 02/19/2023   Decreased ROM of neck 02/19/2023   Neck pain 02/19/2023   COVID-19 virus infection 11/27/2022   Hypokalemia 04/02/2022   PAD (peripheral artery disease) (HCC) 04/02/2022   Gastroesophageal reflux disease without esophagitis 05/17/2020   Obesity (BMI 30-39.9) 10/13/2019   Degenerative lumbar spinal stenosis 10/13/2019   Osteoporosis 09/19/2018   Lumbar radiculopathy 06/03/2018   Atherosclerosis of native arteries of extremity with intermittent claudication (HCC) 03/13/2012   Hypertension complicating diabetes (HCC) 07/09/2011   Allergic rhinitis due to pollen 07/09/2011   DM (diabetes mellitus) with complications (HCC) 07/09/2011   Hyperlipidemia associated with type 2  diabetes mellitus (HCC) 07/09/2011   Arthritis 07/09/2011    ONSET DATE: 02/19/2023 (referral date)  REFERRING DIAG: R29.898 (ICD-10-CM) - Left hand weakness M79.602 (ICD-10-CM) - Left arm pain R20.2 (ICD-10-CM) - Arm paresthesia, left M54.2 (ICD-10-CM) - Neck pain R29.898 (ICD-10-CM) - Decreased ROM of neck  THERAPY DIAG:  Other lack of coordination  Muscle weakness (generalized)  Pain in left hand  Weakness of left hand  Rationale for Evaluation and Treatment: Rehabilitation  SUBJECTIVE:   SUBJECTIVE STATEMENT: Pt asking how the brace that was given by another provider is meant to help. Education provided. She reports numbness still that comes and goes.  Pt accompanied by: self  PERTINENT HISTORY: allergy, arthritis, cataracts, DM, DVT, HTN, obesity, stroke (pt denied), pt reports carpal tunnel syndrome right side, pt reports vertigo and scoliosis  PRECAUTIONS: Fall  WEIGHT BEARING RESTRICTIONS: No  PAIN:  Are you having pain? No  FALLS: Has patient fallen in last 6 months? No - had fall June 2023, hospitalized and had LUE pain at that time, but resolved.   LIVING ENVIRONMENT: Lives with: lives with their daughter Lives in: House/apartment - one level Stairs: Yes: External: 2 steps; can reach both at back entrance Has following equipment at home: Single point cane, Walker - 2 wheeled, shower chair, bed side commode, and pt describes cardiac walker as well. Bathroom  setup: Tub/shower combo, handicapped height toilet  PLOF: Independent with basic ADLs and driving, ambulates with hurrycane, enjoys fishing  PATIENT GOALS: Baiting my fish  OBJECTIVE:   HAND DOMINANCE: Right  ADLs: Eating: Independent self-feeding, daughter cuts up food Grooming: Independent for washing face, brushing teeth, daughter helping with hair care UB Dressing: Pending on the shirt, pt needs assistance LB Dressing: Daughter assists, which is typical beyond the hand complaint  (baseline) Toileting: Pending on clothes pt is wearing, pt needs assistance Bathing: Daughter assists, which is typical beyond hand complaint (baseline) Tub Shower transfers: Supervision at baseline Equipment: Shower seat with back  IADLs: Shopping: Daughter manages (baseline) Light housekeeping: Pt does laundry independently Meal Prep: Daughter currently Health visitor mobility: Hurrycane at all times Medication management: Independent if bottles not too tight, pt gets easy-open bottles Financial management: Daughter manages (baseline) Handwriting: 100% legible  MOBILITY STATUS:  Grossly independent with hurrycane  POSTURE COMMENTS:  rounded shoulders Sitting balance: Moves/returns truncal midpoint 1-2 inches in multiple planes  ACTIVITY TOLERANCE: Activity tolerance: Pt uses motorized carts at stores  FUNCTIONAL OUTCOME MEASURES: Quick Dash: 75%  - Higher score indicates more disability in daily activity  QuickDASH 03/06/23:  Score 43.2%    UPPER EXTREMITY ROM:    Active ROM Right Eval (hx of rotator cuff injury) Left eval  Shoulder flexion 100* 120*  Shoulder abduction 75* 110*  Shoulder adduction    Shoulder extension    Shoulder internal rotation    Shoulder external rotation    Elbow flexion [facility] WFL  Elbow extension [facility] WFL  Wrist flexion [facility] WFL  Wrist extension    Wrist ulnar deviation    Wrist radial deviation    Wrist pronation [facility] WFL  Wrist supination WFL WFL  (Blank rows = not tested)  UPPER EXTREMITY MMT:     MMT Right eval Left eval  Shoulder flexion 3+ 4-  Shoulder abduction    Shoulder adduction    Shoulder extension    Shoulder internal rotation    Shoulder external rotation    Middle trapezius    Lower trapezius    Elbow flexion 4/5 4/5  Elbow extension 4/5 4/5  Wrist flexion    Wrist extension    Wrist ulnar deviation    Wrist radial deviation    Wrist pronation    Wrist supination    (Blank rows = not tested)  HAND  FUNCTION: Grip strength: Right: 52.6 lbs; Left: 38.8 lbs and Lateral pinch: Right: 14 lbs, Left: 4 lbs  COORDINATION: 9 Hole Peg test: Right: 26 sec; Left: 49 sec  SENSATION: Light touch: Impaired on left hand compared to right 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 Pt with slower gait pattern. Well-kept.  03/06/23: Similar appearance.   TODAY'S TREATMENT:  DATE:    Therapeutic exercise - Pt able to teach back 3/4 median glide exercises from last visit. OT progressing pt for improved strength initiating yellow theraputty HEP. Pt able to perform following exercises with teach back and return demo following education. Exercises - Putty Squeezes  - 1 x daily - 7 x weekly - 3 sets - 10 reps - Tip Pinch with Putty  - 1 x daily - 7 x weekly - 3 sets - 10 reps - Key Pinch with Putty  - 1 x daily - 7 x weekly - 3 sets - 10 reps - Rolling Putty on Table  - 1 x daily - 7 x weekly - 3 sets - 10 reps  Neuromuscular reeducation - Pt then performing coordination HEP to improve fine motor skills required for daily activity. Please refer to pt instructions. Pt requiring intermittent verbal cues for correct form.  PATIENT EDUCATION: Education details: Putty HEP, coordination HEP Person educated: Patient Education method:  Explanation, Demonstration, Verbal cues, and Handouts Education comprehension: verbalized understanding, returned demonstration, and verbal cues required  HOME EXERCISE PROGRAM: 02/21/23: Towel squeezes, digit-to-thumb opposition Access Code: [email or web address] URL: [email or web address] Date: 02/25/2023 Prepared by: Wyn Forster Bevan Disney 03/06/23: Added to [employer] HEP, coordination HEP in pt instructions   GOALS: Goals reviewed with patient? Yes  SHORT TERM GOALS: Target date: 03/14/2023   Pt will be independent in LUE HEP. Baseline: initiated 03/06/23: Pt able to demo HEP  Goal status: MET  2.  Pt will demonstrate improved LEFT fine motor coordination for ADLs as evidenced by decreasing 9 hole peg test score for by 3 secs  Baseline: 49 sec Goal status: IN PROGRESS  3.  Pt will demonstrate improved LEFT grip strength for IADLs as evidenced by increasing grip strength by 5 lbs. Baseline: 38.8 lbs 02/25/23: 41.8 Goal status: IN PROGRESS  4.  Pt will verbalize at least 3 fall prevention strategies independently. Baseline:  Goal status: IN PROGRESS  5.  Pt will demonstrate improved LEFT lateral pinch strength required for leisure activity by 2 lbs. Baseline: 4 lbs 02/25/23: 8 lbs Goal status: MET   LONG TERM GOALS: Target date: 04/04/2023   Patient will demonstrate at least 10% improvement with quick Dash score (reporting 65% disability or less) indicating improved functional use of affected extremity. Baseline: 75% 03/06/23: 43.2% Goal status: MET  2.  Pt will demonstrate improved LEFT fine motor coordination for ADLs as evidenced by decreasing 9 hole peg test score by 10 secs  Baseline: 49 secs Goal status: IN PROGRESS  3.  Pt will demonstrate improved LEFT grip strength for IADLs as evidenced by increasing grip strength by 10 lbs. Baseline: 38.8 lbs 02/25/23: 41.8 lbs Goal status: IN PROGRESS  4.  Pt will verbalize at least 2 compensatory methods, to include adaptive  equipment, to utilize for IADL management independently. Baseline:  02/25/23: Pt reports back to normal function and did not have to incorporate any strategies to complete Goal status: MET  5.  Pt will demonstrate improved LEFT lateral pinch strength required for leisure activity by 5 lbs. Baseline: 4 lbs 02/25/23: 8 lbs Goal status: IN PROGRESS   ASSESSMENT:  CLINICAL IMPRESSION: Pt reports continued progress in left hand and states it is better than it ever has been before. Pt desiring one more visit to address hand functioning. Pt demonstrating steady progress as indicated by improved QuickDASH score from 75% at evaluation to now 43.2%. Lower score means less difficulty with daily activities. Pt would benefit from skilled OT  services to address left hand pain, strength, coordination, and ROM to improve activities of daily living.  PERFORMANCE DEFICITS: in functional skills including ADLs, IADLs, coordination, dexterity, sensation, ROM, strength, pain, Fine motor control, mobility, balance, endurance, and UE functional use, cognitive skills including memory, and psychosocial skills including habits.   IMPAIRMENTS: are limiting patient from ADLs, IADLs, leisure, and social participation.   CO-MORBIDITIES: may have co-morbidities  that affects occupational performance. Patient will benefit from skilled OT to address above impairments and improve overall function.  MODIFICATION OR ASSISTANCE TO COMPLETE EVALUATION: No modification of tasks or assist necessary to complete an evaluation.  OT OCCUPATIONAL PROFILE AND HISTORY: Detailed assessment: Review of records and additional review of physical, cognitive, psychosocial history related to current functional performance.  CLINICAL DECISION MAKING: LOW - limited treatment options, no task modification necessary  REHAB POTENTIAL: Good  EVALUATION COMPLEXITY: Low    PLAN:  OT FREQUENCY: 2x/week  OT DURATION: 6 weeks  PLANNED  INTERVENTIONS: self care/ADL training, therapeutic exercise, therapeutic activity, neuromuscular re-education, manual therapy, passive range of motion, balance training, functional mobility training, splinting, electrical stimulation, ultrasound, paraffin, fluidotherapy, moist heat, cryotherapy, contrast bath, patient/family education, energy conservation, coping strategies training, DME and/or AE instructions, and Re-evaluation  RECOMMENDED OTHER SERVICES: None currently  CONSULTED AND AGREED WITH PLAN OF CARE: Patient  PLAN FOR NEXT SESSION: Review latest HEP, fall prevention strategies, retest goals and D/C   Kaniesha Barile M Viriginia Amendola, OT 03/06/2023, 8:39 AM

## 2023-03-08 ENCOUNTER — Ambulatory Visit: Payer: Medicare PPO

## 2023-03-11 ENCOUNTER — Ambulatory Visit: Payer: Medicare PPO | Attending: Medical

## 2023-03-11 DIAGNOSIS — M79642 Pain in left hand: Secondary | ICD-10-CM | POA: Insufficient documentation

## 2023-03-11 DIAGNOSIS — R278 Other lack of coordination: Secondary | ICD-10-CM | POA: Diagnosis not present

## 2023-03-11 DIAGNOSIS — R29898 Other symptoms and signs involving the musculoskeletal system: Secondary | ICD-10-CM | POA: Insufficient documentation

## 2023-03-11 DIAGNOSIS — M6281 Muscle weakness (generalized): Secondary | ICD-10-CM | POA: Insufficient documentation

## 2023-03-11 NOTE — Patient Instructions (Signed)
Fall Prevention Strategies Keep provider informed to stay up-to-date with medications and any previous or recent falls Keep moving! Wear sensible shoes Remove home hazards, such as throw rugs, electrical cords on the floor, clean up clutter Light it up! - Have good lighting within your home Use assistive devices when needed

## 2023-03-11 NOTE — Therapy (Signed)
OUTPATIENT OCCUPATIONAL THERAPY ORTHO TREATMENT AND DISCHARGE SUMMARY  Patient Name: Phyllis Campbell MRN: [id number] DOB:[birth year], 89 y.o., female Today's Date: 03/11/2023  PCP: Ronnald Nian., MD REFERRING PROVIDER: Jac Canavan, PA-C   OCCUPATIONAL THERAPY DISCHARGE SUMMARY  Visits from Start of Care: 4  Current functional level related to goals / functional outcomes: QuickDASH score 43.2   Remaining deficits: Left hand coordination   Education / Equipment: HEP, pegs, putty, fall prevention strategies   Patient agrees to discharge. Patient goals were  partially met with 9/10 goals met . Patient is being discharged due to being pleased with the current functional level.    END OF SESSION:  OT End of Session - 03/11/23 0818     Visit Number 4    Number of Visits 12    Authorization Type [employer]    OT Start Time [phone]    OT Stop Time 0855    OT Time Calculation (min) 34 min    Equipment Utilized During Treatment large pegs    Activity Tolerance Patient tolerated treatment well    Behavior During Therapy WFL for tasks assessed/performed             Past Medical History:  Diagnosis Date   Allergy    Arthritis    Cataracts, bilateral    Diabetes mellitus    DVT (deep venous thrombosis) (HCC)    Dyslipidemia    HH (hiatus hernia)    Hypertension    Obesity    Postmenopausal    Stroke (HCC)    Thyromegaly    Past Surgical History:  Procedure Laterality Date   ABDOMINAL HYSTERECTOMY     cataracts     CESAREAN SECTION     COLONOSCOPY  2004   EYE SURGERY     SHOULDER SURGERY     Patient Active Problem List   Diagnosis Date Noted   Left hand weakness 02/19/2023   Left arm pain 02/19/2023   Arm paresthesia, left 02/19/2023   Decreased ROM of neck 02/19/2023   Neck pain 02/19/2023   COVID-19 virus infection 11/27/2022   Hypokalemia 04/02/2022   PAD (peripheral artery disease) (HCC) 04/02/2022   Gastroesophageal reflux disease without  esophagitis 05/17/2020   Obesity (BMI 30-39.9) 10/13/2019   Degenerative lumbar spinal stenosis 10/13/2019   Osteoporosis 09/19/2018   Lumbar radiculopathy 06/03/2018   Atherosclerosis of native arteries of extremity with intermittent claudication (HCC) 03/13/2012   Hypertension complicating diabetes (HCC) 07/09/2011   Allergic rhinitis due to pollen 07/09/2011   DM (diabetes mellitus) with complications (HCC) 07/09/2011   Hyperlipidemia associated with type 2 diabetes mellitus (HCC) 07/09/2011   Arthritis 07/09/2011    ONSET DATE: 02/19/2023 (referral date)  REFERRING DIAG: R29.898 (ICD-10-CM) - Left hand weakness M79.602 (ICD-10-CM) - Left arm pain R20.2 (ICD-10-CM) - Arm paresthesia, left M54.2 (ICD-10-CM) - Neck pain R29.898 (ICD-10-CM) - Decreased ROM of neck  THERAPY DIAG:  Other lack of coordination  Muscle weakness (generalized)  Pain in left hand  Weakness of left hand  Rationale for Evaluation and Treatment: Rehabilitation  SUBJECTIVE:   SUBJECTIVE STATEMENT: Pt reports having higher blood pressure last Thursday (self-report of upper 140s/90s). Pt reports it got better within 24 hours, and did not have to go to hospital and did not end up contacting PCP. Pt report after some rest and apple cide vinegar she felt better. Pt encouraged to contact PCP or go to hospital if she has more episodes like this given her report of rapid heart  beat and some pain to jaw. Family was with her at this time. Pt also reports arthritis is acting up due to weather.   Pt accompanied by: self  PERTINENT HISTORY: allergy, arthritis, cataracts, DM, DVT, HTN, obesity, stroke (pt denied), pt reports carpal tunnel syndrome right side, pt reports vertigo and scoliosis  PRECAUTIONS: Fall  WEIGHT BEARING RESTRICTIONS: No  PAIN:  Are you having pain? No  FALLS: Has patient fallen in last 6 months? No - had fall June 2023, hospitalized and had LUE pain at that time, but resolved.   LIVING  ENVIRONMENT: Lives with: lives with their daughter Lives in: House/apartment - one level Stairs: Yes: External: 2 steps; can reach both at back entrance Has following equipment at home: Single point cane, Walker - 2 wheeled, shower chair, bed side commode, and pt describes cardiac walker as well. Bathroom setup: Tub/shower combo, handicapped height toilet  PLOF: Independent with basic ADLs and driving, ambulates with hurrycane, enjoys fishing  PATIENT GOALS: Baiting my fish  OBJECTIVE:   HAND DOMINANCE: Right  ADLs: Eating: Independent self-feeding, daughter cuts up food Grooming: Independent for washing face, brushing teeth, daughter helping with hair care UB Dressing: Pending on the shirt, pt needs assistance LB Dressing: Daughter assists, which is typical beyond the hand complaint (baseline) Toileting: Pending on clothes pt is wearing, pt needs assistance Bathing: Daughter assists, which is typical beyond hand complaint (baseline) Tub Shower transfers: Supervision at baseline Equipment: Shower seat with back  IADLs: Shopping: Daughter manages (baseline) Light housekeeping: Pt does laundry independently Meal Prep: Daughter currently Health visitor mobility: Hurrycane at all times Medication management: Independent if bottles not too tight, pt gets easy-open bottles Financial management: Daughter manages (baseline) Handwriting: 100% legible  MOBILITY STATUS:  Grossly independent with hurrycane  POSTURE COMMENTS:  rounded shoulders Sitting balance: Moves/returns truncal midpoint 1-2 inches in multiple planes  ACTIVITY TOLERANCE: Activity tolerance: Pt uses motorized carts at stores  FUNCTIONAL OUTCOME MEASURES: Quick Dash: 75%  - Higher score indicates more disability in daily activity  QuickDASH 03/06/23:  Score 43.2%    UPPER EXTREMITY ROM:    Active ROM Right Eval (hx of rotator cuff injury) Left eval  Shoulder flexion 100* 120*  Shoulder abduction 75*  110*  Shoulder adduction    Shoulder extension    Shoulder internal rotation    Shoulder external rotation    Elbow flexion [facility] WFL  Elbow extension [facility] WFL  Wrist flexion [facility] WFL  Wrist extension    Wrist ulnar deviation    Wrist radial deviation    Wrist pronation [facility] WFL  Wrist supination WFL WFL  (Blank rows = not tested)  UPPER EXTREMITY MMT:     MMT Right eval Left eval  Shoulder flexion 3+ 4-  Shoulder abduction    Shoulder adduction    Shoulder extension    Shoulder internal rotation    Shoulder external rotation    Middle trapezius    Lower trapezius    Elbow flexion 4/5 4/5  Elbow extension 4/5 4/5  Wrist flexion    Wrist extension    Wrist ulnar deviation    Wrist radial deviation    Wrist pronation    Wrist supination    (Blank rows = not tested)  HAND FUNCTION: Grip strength: Right: 52.6 lbs; Left: 38.8 lbs and Lateral pinch: Right: 14 lbs, Left: 4 lbs  COORDINATION: 9 Hole Peg test: Right: 26 sec; Left: 49 sec  SENSATION: Light touch: Impaired on left hand compared to  right 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 Pt with slower gait pattern. Well-kept.  03/06/23: Similar appearance. 03/11/23: Similar appearance   TODAY'S TREATMENT:                                                                                                                              DATE:    BP today  146/66 Therapeutic activity - Reviewed goals in preparation for discharge with patient meeting 9/10 goals. Patient then provided education regarding fall prevention strategies due to pt report of falls within last year. Fall prevention strategies discussed in more detail beyond what was given in pt instructions, and patient able to verbalize 4/6 strategies following education. Pt then engaging in seated tabletop activity of grasping large pegs and placing in to peg mat utilizing Left hand, then removing and placing back in bowl. Pt performing 2 trials requiring intermittent verbal, visual, and tactile cues for correct form.   PATIENT EDUCATION: Education details: Fall prevention, continue HEP previously given Person educated: Patient Education method: Explanation, Demonstration, Verbal cues, and Handouts Education comprehension: verbalized understanding, returned demonstration, and verbal cues required  HOME EXERCISE PROGRAM: 02/21/23: Towel squeezes, digit-to-thumb opposition Access Code: [email or web address] URL: [email or web address] Date: 02/25/2023 Prepared by: Wyn Forster Yarielys Beed 03/06/23: Added to [employer] HEP, coordination HEP in pt instructions 03/11/23: Fall prevention   GOALS: Goals reviewed with patient? Yes  SHORT TERM GOALS: Target date: 03/14/2023   Pt will be independent in LUE HEP. Baseline: initiated 03/06/23: Pt able to demo HEP  Goal status: MET  2.  Pt will demonstrate improved LEFT fine motor coordination for ADLs as evidenced by decreasing 9 hole peg test score for by 3 secs  Baseline: 49 sec 03/11/23: Lt - 45 secs Goal status: MET  3.  Pt will demonstrate improved LEFT grip strength for IADLs as evidenced by increasing grip strength by 5 lbs. Baseline: 38.8 lbs 02/25/23: 41.8 03/11/23: 53.5 lbs Goal status: MET  4.  Pt will verbalize at least 3 fall prevention strategies independently. Baseline:  03/11/23: Strategies given, verbalized 4/6 strategies Goal status:  MET  5.  Pt will demonstrate improved LEFT lateral pinch strength required for leisure activity by 2 lbs. Baseline: 4 lbs 02/25/23: 8 lbs Goal status: MET   LONG TERM GOALS: Target date: 04/04/2023   Patient will demonstrate at least 10% improvement with quick Dash score (reporting 65% disability or less) indicating improved functional use of affected extremity. Baseline: 75% 03/06/23: 43.2% Goal status: MET  2.  Pt will  demonstrate improved LEFT fine motor coordination for ADLs as evidenced by decreasing 9 hole peg test score by 10 secs  Baseline: 49 secs 03/11/23: Lt - 45 secs Goal status: NOT MET  3.  Pt will demonstrate improved LEFT grip strength for IADLs as evidenced by increasing grip strength by 10 lbs. Baseline: 38.8 lbs 02/25/23: 41.8 lbs 03/11/23: 53.5 lbs Goal status: MET  4.  Pt will verbalize at least 2 compensatory methods, to include adaptive equipment, to utilize for IADL management independently. Baseline:  02/25/23: Pt reports back to normal function and did not have to incorporate any strategies to complete Goal status: MET  5.  Pt will demonstrate improved LEFT lateral pinch strength required for leisure activity by 5 lbs. Baseline: 4 lbs 02/25/23: 8 lbs 03/11/23: 10 lbs Goal status: MET   ASSESSMENT:  CLINICAL IMPRESSION: Pt has been pleased with overall progress in occupational therapy. Objectively, pt meeting 9/10 goals. Pt reports she feels back to her normal and agreeable to discharging from occupational therapy. Please refer above for discharge summary, and therapeutic interventions. Patient has no further OT needs at this time. Please re-consult if needs arise.  PERFORMANCE DEFICITS: in functional skills including ADLs, IADLs, coordination, dexterity, sensation, ROM, strength, pain, Fine motor control, mobility, balance, endurance, and UE functional use, cognitive skills including memory, and psychosocial skills including habits.   IMPAIRMENTS: are  limiting patient from ADLs, IADLs, leisure, and social participation.   CO-MORBIDITIES: may have co-morbidities  that affects occupational performance. Patient will benefit from skilled OT to address above impairments and improve overall function.  MODIFICATION OR ASSISTANCE TO COMPLETE EVALUATION: No modification of tasks or assist necessary to complete an evaluation.  OT OCCUPATIONAL PROFILE AND HISTORY: Detailed assessment: Review of records and additional review of physical, cognitive, psychosocial history related to current functional performance.  CLINICAL DECISION MAKING: LOW - limited treatment options, no task modification necessary  REHAB POTENTIAL: Good  EVALUATION COMPLEXITY: Low    PLAN:  OT FREQUENCY: 2x/week  OT DURATION: 6 weeks  PLANNED INTERVENTIONS: self care/ADL training, therapeutic exercise, therapeutic activity, neuromuscular re-education, manual therapy, passive range of motion, balance training, functional mobility training, splinting, electrical stimulation, ultrasound, paraffin, fluidotherapy, moist heat, cryotherapy, contrast bath, patient/family education, energy conservation, coping strategies training, DME and/or AE instructions, and Re-evaluation  RECOMMENDED OTHER SERVICES: None currently  CONSULTED AND AGREED WITH PLAN OF CARE: Patient  PLAN FOR NEXT SESSION: D/C   Nasier Thumm M Tedd Cottrill, OT 03/11/2023, 8:59 AM

## 2023-03-13 DIAGNOSIS — E785 Hyperlipidemia, unspecified: Secondary | ICD-10-CM | POA: Diagnosis not present

## 2023-03-13 DIAGNOSIS — H04129 Dry eye syndrome of unspecified lacrimal gland: Secondary | ICD-10-CM | POA: Diagnosis not present

## 2023-03-13 DIAGNOSIS — R32 Unspecified urinary incontinence: Secondary | ICD-10-CM | POA: Diagnosis not present

## 2023-03-13 DIAGNOSIS — M1990 Unspecified osteoarthritis, unspecified site: Secondary | ICD-10-CM | POA: Diagnosis not present

## 2023-03-13 DIAGNOSIS — M810 Age-related osteoporosis without current pathological fracture: Secondary | ICD-10-CM | POA: Diagnosis not present

## 2023-03-13 DIAGNOSIS — E669 Obesity, unspecified: Secondary | ICD-10-CM | POA: Diagnosis not present

## 2023-03-13 DIAGNOSIS — G629 Polyneuropathy, unspecified: Secondary | ICD-10-CM | POA: Diagnosis not present

## 2023-03-13 DIAGNOSIS — K5900 Constipation, unspecified: Secondary | ICD-10-CM | POA: Diagnosis not present

## 2023-03-13 DIAGNOSIS — K219 Gastro-esophageal reflux disease without esophagitis: Secondary | ICD-10-CM | POA: Diagnosis not present

## 2023-03-14 ENCOUNTER — Ambulatory Visit: Payer: Medicare PPO

## 2023-03-21 ENCOUNTER — Encounter: Payer: Medicare PPO | Admitting: Occupational Therapy

## 2023-03-25 ENCOUNTER — Encounter: Payer: Medicare PPO | Admitting: Occupational Therapy

## 2023-03-28 ENCOUNTER — Encounter: Payer: Medicare PPO | Admitting: Occupational Therapy

## 2023-04-18 ENCOUNTER — Other Ambulatory Visit: Payer: Self-pay

## 2023-04-18 ENCOUNTER — Ambulatory Visit
Admission: RE | Admit: 2023-04-18 | Discharge: 2023-04-18 | Disposition: A | Discharge status: Home / Self Care | Payer: Medicare PPO | Source: Ambulatory Visit | Attending: Family Medicine | Admitting: Family Medicine

## 2023-04-18 DIAGNOSIS — E349 Endocrine disorder, unspecified: Secondary | ICD-10-CM | POA: Diagnosis not present

## 2023-04-18 DIAGNOSIS — N958 Other specified menopausal and perimenopausal disorders: Secondary | ICD-10-CM | POA: Diagnosis not present

## 2023-04-18 DIAGNOSIS — M8588 Other specified disorders of bone density and structure, other site: Secondary | ICD-10-CM | POA: Diagnosis not present

## 2023-04-18 DIAGNOSIS — Z90722 Acquired absence of ovaries, bilateral: Secondary | ICD-10-CM | POA: Diagnosis not present

## 2023-04-25 ENCOUNTER — Other Ambulatory Visit: Payer: Self-pay | Admitting: Family Medicine

## 2023-04-25 NOTE — Telephone Encounter (Signed)
Is it ok to refill? Last prescribed 02/12/2022.

## 2023-05-08 ENCOUNTER — Encounter: Payer: Self-pay | Admitting: Family Medicine

## 2023-05-08 ENCOUNTER — Ambulatory Visit: Payer: Medicare PPO | Admitting: Family Medicine

## 2023-05-08 VITALS — BP 138/68 | HR 74 | Temp 97.4°F | Resp 14 | Wt 201.0 lb

## 2023-05-08 DIAGNOSIS — E118 Type 2 diabetes mellitus with unspecified complications: Secondary | ICD-10-CM

## 2023-05-08 DIAGNOSIS — I152 Hypertension secondary to endocrine disorders: Secondary | ICD-10-CM | POA: Diagnosis not present

## 2023-05-08 DIAGNOSIS — E1159 Type 2 diabetes mellitus with other circulatory complications: Secondary | ICD-10-CM

## 2023-05-08 DIAGNOSIS — R079 Chest pain, unspecified: Secondary | ICD-10-CM

## 2023-05-08 LAB — POCT GLYCOSYLATED HEMOGLOBIN (HGB A1C): Hemoglobin A1C: 6.7 % — AB (ref 4.0–5.6)

## 2023-05-08 NOTE — Progress Notes (Signed)
   Subjective:    Patient ID: Phyllis Campbell, female    DOB: 1931-12-23, 89 y.o.   MRN: [id number]  HPI She is here because she states she is feels bad.  She says that over the last 2 days she has had 2 episodes of of diffuse kind of chest pain with perception of palpitations and some nausea but no diaphoresis, shortness of breath, weakness, vomiting diarrhea.  She does have underlying diabetes but is not on any diabetes related medications.  She continues on her blood pressure medication.  She is also taking simvastatin.   Review of Systems     Objective:    Physical Exam Alert and in no distress.  Cardiac exam shows regular rhythm without murmurs or gallops.  Lungs clear to auscultation. EKG read by me shows a ventricular rate of 71 with other parameters being totally normal.      Assessment & Plan:   Problem List Items Addressed This Visit     DM (diabetes mellitus) with complications (HCC) - Primary   Other Visit Diagnoses     Chest pain, unspecified type         I explained that at this time I am not sure exactly what is causing her symptoms.  Recommend she pay attention to the pain in terms of other symptoms that might be associated with that including pulmonary, GI also instructed her to see if she can objectively have assessed her pulse and explained how to check the pulse rate.  She will keep me informed. I then explained that her diabetes seems to be doing fairly well.  We will continue to monitor this.

## 2023-05-14 DIAGNOSIS — Z961 Presence of intraocular lens: Secondary | ICD-10-CM | POA: Diagnosis not present

## 2023-05-14 DIAGNOSIS — H26492 Other secondary cataract, left eye: Secondary | ICD-10-CM | POA: Diagnosis not present

## 2023-05-14 DIAGNOSIS — H16223 Keratoconjunctivitis sicca, not specified as Sjogren's, bilateral: Secondary | ICD-10-CM | POA: Diagnosis not present

## 2023-05-14 DIAGNOSIS — H43813 Vitreous degeneration, bilateral: Secondary | ICD-10-CM | POA: Diagnosis not present

## 2023-05-28 ENCOUNTER — Telehealth (INDEPENDENT_AMBULATORY_CARE_PROVIDER_SITE_OTHER): Payer: Medicare PPO | Admitting: Nurse Practitioner

## 2023-05-28 ENCOUNTER — Encounter: Payer: Self-pay | Admitting: Nurse Practitioner

## 2023-05-28 VITALS — Temp 100.4°F | Ht 64.0 in | Wt 201.0 lb

## 2023-05-28 DIAGNOSIS — U071 COVID-19: Secondary | ICD-10-CM | POA: Diagnosis not present

## 2023-05-28 MED ORDER — NIRMATRELVIR/RITONAVIR (PAXLOVID)TABLET
3.0000 | ORAL_TABLET | Freq: Two times a day (BID) | ORAL | 0 refills | Status: AC
Start: 2023-05-28 — End: 2023-06-02

## 2023-05-28 NOTE — Patient Instructions (Addendum)
You have been prescribed Paxlovid to treat your COVID-19 infection.   In the event your Paxlovid prescription is not covered by insurance, please follow the instructions below.  Go to the following website  [email or web address]  2. Select   I am a patient/caregiver   3. Answer the questions of eligibility  4. Show approval to pharmacist  If you need assistance, customer support can be contacted at [phone]  *Please note, this is a separate patient support program provided by [employer]. Your provider does not have any affiliation with this company and cannot guarantee that you will qualify for assistance. Your pharmacist may be able to provide information on additional support programs if you do not qualify for this one or wish to choose a different program.    Instructions I have sent in Paxlovid for your COVID infection.   You can continue to take Coricidin to help with symptoms.  Take all of your regular medications except Atorvastatin. Hold this medication while you are on the Paxlovid and start back once you finish the Paxlovid.    You can take ibuprofen and tylenol for fever, body aches, and headache.   Be sure you rest as much as possible and drink plenty of water.   If you are not feeling better by next week, or if you get better then feel bad again, please let us know.

## 2023-05-28 NOTE — Progress Notes (Signed)
Virtual Visit Encounter mychart visit.   I connected with  Charise Carwin Raphael on 05/28/23 at  2:00 PM EDT by secure video and audio telemedicine application. I verified that I am speaking with the correct person using two identifiers.   I introduced myself as a Publishing rights manager with the practice. The limitations of evaluation and management by telemedicine discussed with the patient and the availability of in person appointments. The patient expressed verbal understanding and consent to proceed.  Participating parties in this visit include: Myself and patient  The patient is: Patient Location: Home I am: Provider Location: Office/Clinic Subjective:    CC and HPI: IYONA NANNI is a 89 y.o. year old female presenting for new evaluation and treatment of COVID. Patient reports the following:  Ms. Pollmann started feeling bad on Sunday afternoon. She tells me she continued to feel bad and last night she had her daughter test her for COVID, which was positive. Her symptoms are fatigue headache, body aches, congestion, and cough. No shortness of breath, chest pain, dizziness are present. She has not had high fevers.   She is taking coricidin for symptoms.   Past medical history, Surgical history, Family history not pertinant except as noted below, Social history, Allergies, and medications have been entered into the medical record, reviewed, and corrections made.   Review of Systems:  All review of systems negative except what is listed in the HPI  Objective:    Alert and oriented x 4 Fatigued sounding Speaking in clear sentences with no shortness of breath. No distress.  Impression and Recommendations:    Problem List Items Addressed This Visit     COVID-19 virus infection - Primary    Positive COVID-19 infection with moderate symptoms. Given patients age she is at high risk of severe infection and complications. We discussed paxlovid for treatment and she would like to take this. GFR  has been stable and >60. Plan: - Paxlovid sent to pharmacy - OK to take tylenol or ibuprofen for headache, body aches, and fevers - Rest as much as possible - Stay well hydrated - OK to continue with Coricidin.  - If severe symptoms develop such as fevers > 104, mental status changes, shortness of breath, or other concerning symptoms please seek emergency assistance.  - F/U if symptoms are still present into next week.       Relevant Medications   nirmatrelvir/ritonavir (PAXLOVID) 20 x 150 MG & 10 x 100MG  TABS    orders and follow up as documented in EMR I discussed the assessment and treatment plan with the patient. The patient was provided an opportunity to ask questions and all were answered. The patient agreed with the plan and demonstrated an understanding of the instructions.   The patient was advised to call back or seek an in-person evaluation if the symptoms worsen or if the condition fails to improve as anticipated.  Follow-Up: prn  I provided 21 minutes of non-face-to-face interaction with this non face-to-face encounter including intake, same-day documentation, and chart review.   Tollie Eth, NP , DNP, AGNP-c [facility]

## 2023-05-28 NOTE — Assessment & Plan Note (Signed)
Positive COVID-19 infection with moderate symptoms. Given patients age she is at high risk of severe infection and complications. We discussed paxlovid for treatment and she would like to take this. GFR has been stable and >60. Plan: - Paxlovid sent to pharmacy - OK to take tylenol or ibuprofen for headache, body aches, and fevers - Rest as much as possible - Stay well hydrated - OK to continue with Coricidin.  - If severe symptoms develop such as fevers > 104, mental status changes, shortness of breath, or other concerning symptoms please seek emergency assistance.  - F/U if symptoms are still present into next week.

## 2023-06-03 ENCOUNTER — Encounter (HOSPITAL_COMMUNITY): Payer: Self-pay

## 2023-06-03 ENCOUNTER — Emergency Department (HOSPITAL_COMMUNITY)
Admission: EM | Admit: 2023-06-03 | Discharge: 2023-06-03 | Disposition: A | Discharge status: Home / Self Care | Payer: Medicare PPO | Attending: Emergency Medicine | Admitting: Emergency Medicine

## 2023-06-03 ENCOUNTER — Emergency Department (HOSPITAL_COMMUNITY): Payer: Medicare PPO

## 2023-06-03 DIAGNOSIS — R058 Other specified cough: Secondary | ICD-10-CM | POA: Diagnosis not present

## 2023-06-03 DIAGNOSIS — E119 Type 2 diabetes mellitus without complications: Secondary | ICD-10-CM | POA: Diagnosis not present

## 2023-06-03 DIAGNOSIS — Z7982 Long term (current) use of aspirin: Secondary | ICD-10-CM | POA: Insufficient documentation

## 2023-06-03 DIAGNOSIS — R42 Dizziness and giddiness: Secondary | ICD-10-CM | POA: Insufficient documentation

## 2023-06-03 DIAGNOSIS — I10 Essential (primary) hypertension: Secondary | ICD-10-CM | POA: Insufficient documentation

## 2023-06-03 DIAGNOSIS — Z79899 Other long term (current) drug therapy: Secondary | ICD-10-CM | POA: Diagnosis not present

## 2023-06-03 DIAGNOSIS — R509 Fever, unspecified: Secondary | ICD-10-CM | POA: Diagnosis present

## 2023-06-03 DIAGNOSIS — B349 Viral infection, unspecified: Secondary | ICD-10-CM | POA: Insufficient documentation

## 2023-06-03 LAB — CBC WITH DIFFERENTIAL/PLATELET
Abs Immature Granulocytes: 0.03 10*3/uL (ref 0.00–0.07)
Basophils Absolute: 0 10*3/uL (ref 0.0–0.1)
Basophils Relative: 0 %
Eosinophils Absolute: 0 10*3/uL (ref 0.0–0.5)
Eosinophils Relative: 0 %
HCT: 41.5 % (ref 36.0–46.0)
Hemoglobin: 13.4 g/dL (ref 12.0–15.0)
Immature Granulocytes: 1 %
Lymphocytes Relative: 22 %
Lymphs Abs: 1.2 10*3/uL (ref 0.7–4.0)
MCH: 30.9 pg (ref 26.0–34.0)
MCHC: 32.3 g/dL (ref 30.0–36.0)
MCV: 95.6 fL (ref 80.0–100.0)
Monocytes Absolute: 0.4 10*3/uL (ref 0.1–1.0)
Monocytes Relative: 7 %
Neutro Abs: 3.7 10*3/uL (ref 1.7–7.7)
Neutrophils Relative %: 70 %
Platelets: 190 10*3/uL (ref 150–400)
RBC: 4.34 MIL/uL (ref 3.87–5.11)
RDW: 13.3 % (ref 11.5–15.5)
WBC: 5.4 10*3/uL (ref 4.0–10.5)
nRBC: 0 % (ref 0.0–0.2)

## 2023-06-03 LAB — BASIC METABOLIC PANEL
Anion gap: 12 (ref 5–15)
BUN: 18 mg/dL (ref 8–23)
CO2: 25 mmol/L (ref 22–32)
Calcium: 10 mg/dL (ref 8.9–10.3)
Chloride: 103 mmol/L (ref 98–111)
Creatinine, Ser: 0.79 mg/dL (ref 0.44–1.00)
GFR, Estimated: >60 mL/min (ref >60)
Glucose, Bld: 152 mg/dL — ABNORMAL HIGH (ref 70–99)
Potassium: 3.6 mmol/L (ref 3.5–5.1)
Sodium: 140 mmol/L (ref 135–145)

## 2023-06-03 MED ORDER — HYDROCHLOROTHIAZIDE 12.5 MG PO TABS
12.5000 mg | ORAL_TABLET | Freq: Once | ORAL | Status: AC
  Administered 2023-06-03: 12.5 mg via ORAL
  Filled 2023-06-03: qty 1

## 2023-06-03 MED ORDER — ACETAMINOPHEN 325 MG PO TABS
650.0000 mg | ORAL_TABLET | ORAL | Status: AC
  Administered 2023-06-03: 650 mg via ORAL
  Filled 2023-06-03: qty 2

## 2023-06-03 MED ORDER — LOSARTAN POTASSIUM 25 MG PO TABS
50.0000 mg | ORAL_TABLET | ORAL | Status: AC
  Administered 2023-06-03: 50 mg via ORAL
  Filled 2023-06-03: qty 2

## 2023-06-03 NOTE — ED Triage Notes (Addendum)
PT arrives via POV. Pt reports she tested positive for covid six days ago. She started paxlovid. Pt reports ongoing headache, cough, and fatigue. Pt reports cough has now been productive. PT is AxOx4. NAD.

## 2023-06-03 NOTE — ED Provider Notes (Signed)
[place] EMERGENCY DEPARTMENT AT [facility] Provider Note   CSN: [id number] Arrival date & time: 06/03/23  4098     History  Chief Complaint  Patient presents with   Covid Positive    Phyllis Campbell is a 89 y.o. female with medical history of thyromegaly, obesity, hypertension, DVT, diabetes.  Patient presents to ED for evaluation of COVID-positive status.  Patient reports that since Saturday she has had on and off body aches and chills, headache, fever, productive cough, fatigue.  Patient states that on Sunday she was diagnosed with COVID-19.  She states that since this time she has been taking Coricidin, Mucinex.  She reports that at 1 point she took Tylenol on Saturday but has not taken Tylenol since then.  She denies shortness of breath, chest pain, nausea, vomiting.  She is endorsing lightheadedness, dizziness.  She is also concerned about her blood pressure currently at 155/90.  She reports that she has not taken her blood pressure medication today.  HPI     Home Medications Prior to Admission medications   Medication Sig Start Date End Date Taking? Authorizing Provider  albuterol (VENTOLIN HFA) 108 (90 Base) MCG/ACT inhaler Inhale 1-2 puffs into the lungs every 4 (four) hours as needed for wheezing or shortness of breath. Patient not taking: Reported on 05/28/2023 11/27/22   Tollie Eth, NP  aspirin EC 81 MG tablet Take 81 mg by mouth every evening.    [provider]  Calcium Carb-Cholecalciferol (CALCIUM 600+D3 PO) Take 1 tablet by mouth daily.    [provider]  Chlorpheniramine-DM (CORICIDIN HBP COUGH/COLD PO) Take 1 tablet by mouth every 8 (eight) hours as needed (for cold symptoms). Patient not taking: Reported on 05/08/2023    [provider]  losartan-hydrochlorothiazide [facility]) 50-12.5 MG tablet TAKE 1 TABLET EVERY DAY 12/31/22   Ronnald Nian, MD  meclizine (ANTIVERT) 12.5 MG tablet TAKE 1 TABLET BY MOUTH THREE TIMES DAILY AS  NEEDED FOR DIZZINESS 04/25/23   Ronnald Nian, MD  polyethylene glycol (MIRALAX / GLYCOLAX) 17 g packet Take 17 g by mouth daily. 04/05/22   Lanae Boast, MD  Pseudoephedrine-Ibuprofen (ADVIL COLD/SINUS PO) Take 1 tablet by mouth every 8 (eight) hours as needed (for congestion).    [provider]  simvastatin (ZOCOR) 40 MG tablet TAKE 1 TABLET EVERY DAY 12/31/22   Ronnald Nian, MD      Allergies    Tramadol    Review of Systems   Review of Systems  Constitutional:  Positive for chills, fatigue and fever.  Respiratory:  Positive for cough. Negative for shortness of breath.   Cardiovascular:  Negative for chest pain.  Musculoskeletal:  Positive for myalgias.  Neurological:  Positive for headaches.  All other systems reviewed and are negative.   Physical Exam Updated Vital Signs BP (!) 133/111 (BP Location: Left Arm)   Pulse 87   Temp 97.6 F (36.4 C) (Oral)   Resp 18   Ht 5\' 4"  (1.626 m)   Wt 91.2 kg   SpO2 100%   BMI 34.50 kg/m  Physical Exam Vitals and nursing note reviewed.  Constitutional:      General: She is not in acute distress.    Appearance: Normal appearance. She is not ill-appearing, toxic-appearing or diaphoretic.  HENT:     Head: Normocephalic and atraumatic.     Nose: Nose normal.     Mouth/Throat:     Mouth: Mucous membranes are moist.  Pharynx: Oropharynx is clear.  Eyes:     Extraocular Movements: Extraocular movements intact.     Conjunctiva/sclera: Conjunctivae normal.     Pupils: Pupils are equal, round, and reactive to light.  Cardiovascular:     Rate and Rhythm: Normal rate and regular rhythm.  Pulmonary:     Effort: Pulmonary effort is normal.     Breath sounds: Normal breath sounds. No wheezing.     Comments: No respiratory distress Abdominal:     General: Abdomen is flat. Bowel sounds are normal.     Palpations: Abdomen is soft.     Tenderness: There is no abdominal tenderness.  Musculoskeletal:     Cervical back: Normal  range of motion and neck supple. No tenderness.  Skin:    General: Skin is warm and dry.     Capillary Refill: Capillary refill takes less than 2 seconds.  Neurological:     General: No focal deficit present.     Mental Status: She is alert and oriented to person, place, and time.     GCS: GCS eye subscore is 4. GCS verbal subscore is 5. GCS motor subscore is 6.     Cranial Nerves: Cranial nerves 2-12 are intact. No cranial nerve deficit.     Sensory: Sensation is intact. No sensory deficit.     Motor: Motor function is intact. No weakness.     Coordination: Coordination is intact. Heel to Shin Test normal.     ED Results / Procedures / Treatments   Labs (all labs ordered are listed, but only abnormal results are displayed) Labs Reviewed  BASIC METABOLIC PANEL - Abnormal; Notable for the following components:      Result Value   Glucose, Bld 152 (*)    All other components within normal limits  CBC WITH DIFFERENTIAL/PLATELET    EKG None  Radiology DG Chest 2 View  Result Date: 06/03/2023 CLINICAL DATA:  Productive cough.  Recent COVID positive. EXAM: CHEST - 2 VIEW COMPARISON:  None Available. FINDINGS: Bilateral lung fields are clear. Bilateral costophrenic angles are clear. Normal cardio-mediastinal silhouette. Prominent main pulmonary trunk shadow, nonspecific but can be seen with pulmonary artery hypertension. Correlate clinically. No acute osseous abnormalities. The soft tissues are within normal limits. IMPRESSION: 1. No active cardiopulmonary disease. 2. Prominent main pulmonary trunk shadow, nonspecific but can be seen with pulmonary artery hypertension. Electronically Signed   By: Jules Schick M.D.   On: 06/03/2023 10:20    Procedures Procedures   Medications Ordered in ED Medications  acetaminophen (TYLENOL) tablet 650 mg (650 mg Oral Given 06/03/23 1021)  losartan (COZAAR) tablet 50 mg (50 mg Oral Given 06/03/23 1021)  hydrochlorothiazide (HYDRODIURIL) tablet 12.5  mg (12.5 mg Oral Given 06/03/23 1021)    ED Course/ Medical Decision Making/ A&P   Medical Decision Making Amount and/or Complexity of Data Reviewed Labs: ordered. Radiology: ordered.  Risk OTC drugs. Prescription drug management.    89 year old female presents to ED for evaluation.  Please see HPI for further details.  On examination patient is afebrile and nontachycardic.  Patient lung sounds are clear bilaterally and she is not hypoxic.  Her abdomen is soft and compressible throughout.  Posterior oropharynx has midline uvula.  No change in phonation.  No drooling.  Her neurological examination is at baseline without focal neurodeficits.  Patient CBC unremarkable.  Metabolic panel unremarkable.  Chest x-ray shows no consolidations or effusions.  Patient ambulated, did not desaturate.  The patient was given her home blood  pressure medication as well as Tylenol.  At this time, patient most likely experiencing symptoms secondary to her COVID-19 diagnosis.  Patient is not hypoxic.  Patient is already on antiviral medication.  Her lab work is reassuring.  The patient will be discharged at this time.  She will follow-up with her PCP for reevaluation.  Patient advised to return to the ED with any new or worsening signs or symptoms and she voiced understanding.  She had all of her questions answered to her satisfaction.  She is stable to discharge at this time.   Final Clinical Impression(s) / ED Diagnoses Final diagnoses:  Viral illness    Rx / DC Orders ED Discharge Orders     None         Clent Ridges 06/03/23 1124    Gloris Manchester, MD 06/04/23 1711

## 2023-06-03 NOTE — Discharge Instructions (Signed)
It was a pleasure taking part in your care today.  As we discussed, your workup is reassuring.  There is no evidence of pneumonia on your chest x-ray.  Please continue taking Paxlovid at home.  Please continue treating her symptoms with Tylenol for headache and fever.  Please follow-up with your PCP this week for further management and reevaluation.  Please return to the ED with any new or worsening signs or symptoms.

## 2023-06-12 ENCOUNTER — Encounter: Payer: Self-pay | Admitting: Family Medicine

## 2023-06-12 ENCOUNTER — Ambulatory Visit: Payer: Medicare PPO | Admitting: Family Medicine

## 2023-06-12 VITALS — BP 120/66 | HR 74 | Ht 64.0 in | Wt 203.6 lb

## 2023-06-12 DIAGNOSIS — Z8616 Personal history of COVID-19: Secondary | ICD-10-CM

## 2023-06-12 DIAGNOSIS — Z23 Encounter for immunization: Secondary | ICD-10-CM | POA: Diagnosis not present

## 2023-06-12 NOTE — Progress Notes (Signed)
   Subjective:    Patient ID: Phyllis Campbell, female    DOB: [birth year], 89 y.o.   MRN: [id number]  HPI She is here for follow-up.  Recent treatment for COVID and emergency room visit  She was placed on Paxlovid for treatment of COVID.  She was doing well but her blood pressure became elevated and her daughter became concerned and took her to the emergency room.  The ER record was reviewed.  She was essentially treated for viral URI with OTC medications.  She is now 1 week down the road and only having a slight cough but no fever, chills, sore throat or earache   Review of Systems     Objective:    Physical Exam Alert and in no distress. Tympanic membranes and canals are normal. Pharyngeal area is normal. Neck is supple without adenopathy or thyromegaly. Cardiac exam shows a regular sinus rhythm without murmurs or gallops. Lungs are clear to auscultation.        Assessment & Plan:  History of COVID-19  Need for influenza vaccination - Plan: Flu Vaccine Trivalent High Dose (Fluad) At this point there is no need to treat.  Discussed treatment of blood pressure in the future and recommend to call before coming to the emergency room if there is concern about blood pressure.

## 2023-06-17 ENCOUNTER — Telehealth: Payer: Self-pay

## 2023-06-17 NOTE — Telephone Encounter (Signed)
Transition Care Management Unsuccessful Follow-up Telephone Call  Date of discharge and from where:  06/03/2023 [facility]  Attempts:  2nd Attempt  Reason for unsuccessful TCM follow-up call:  Left voice message  Fatin Bachicha [facility]  [facility] Resource Care Guide Direct Dial: [phone]  Website: [email or web address]

## 2023-06-17 NOTE — Telephone Encounter (Signed)
Transition Care Management Unsuccessful Follow-up Telephone Call  Date of discharge and from where:  06/03/2023 [facility]  Attempts:  1st Attempt  Reason for unsuccessful TCM follow-up call:  Left voice message  Sebrena Engh [facility]  [facility] Population Health Community Resource Care Guide   [email or web address]  ?? [phone]   Website: [email or web address]  [email or web address]

## 2023-06-26 ENCOUNTER — Telehealth: Payer: Self-pay | Admitting: Family Medicine

## 2023-06-26 NOTE — Telephone Encounter (Signed)
Recv'd fax from [facility] that they were taking over filling her medications.  Per pt she does not want to switch to [facility].  This form was denied & faxed back.

## 2023-08-20 ENCOUNTER — Ambulatory Visit: Payer: Medicare PPO | Admitting: Medical

## 2023-09-13 ENCOUNTER — Other Ambulatory Visit: Payer: Self-pay | Admitting: Family Medicine

## 2023-11-07 ENCOUNTER — Ambulatory Visit (INDEPENDENT_AMBULATORY_CARE_PROVIDER_SITE_OTHER): Payer: Medicare PPO | Admitting: Family Medicine

## 2023-11-07 ENCOUNTER — Encounter: Payer: Self-pay | Admitting: Family Medicine

## 2023-11-07 VITALS — BP 132/64 | HR 72 | Ht 64.75 in | Wt 199.4 lb

## 2023-11-07 DIAGNOSIS — E785 Hyperlipidemia, unspecified: Secondary | ICD-10-CM | POA: Diagnosis not present

## 2023-11-07 DIAGNOSIS — E669 Obesity, unspecified: Secondary | ICD-10-CM | POA: Diagnosis not present

## 2023-11-07 DIAGNOSIS — E1169 Type 2 diabetes mellitus with other specified complication: Secondary | ICD-10-CM | POA: Diagnosis not present

## 2023-11-07 DIAGNOSIS — E118 Type 2 diabetes mellitus with unspecified complications: Secondary | ICD-10-CM | POA: Diagnosis not present

## 2023-11-07 DIAGNOSIS — M5416 Radiculopathy, lumbar region: Secondary | ICD-10-CM

## 2023-11-07 DIAGNOSIS — M1990 Unspecified osteoarthritis, unspecified site: Secondary | ICD-10-CM | POA: Diagnosis not present

## 2023-11-07 DIAGNOSIS — Z0000 Encounter for general adult medical examination without abnormal findings: Secondary | ICD-10-CM | POA: Diagnosis not present

## 2023-11-07 DIAGNOSIS — M8000XA Age-related osteoporosis with current pathological fracture, unspecified site, initial encounter for fracture: Secondary | ICD-10-CM

## 2023-11-07 DIAGNOSIS — J301 Allergic rhinitis due to pollen: Secondary | ICD-10-CM

## 2023-11-07 DIAGNOSIS — I70212 Atherosclerosis of native arteries of extremities with intermittent claudication, left leg: Secondary | ICD-10-CM

## 2023-11-07 DIAGNOSIS — I10 Essential (primary) hypertension: Secondary | ICD-10-CM

## 2023-11-07 DIAGNOSIS — Z23 Encounter for immunization: Secondary | ICD-10-CM

## 2023-11-07 DIAGNOSIS — E119 Type 2 diabetes mellitus without complications: Secondary | ICD-10-CM | POA: Diagnosis not present

## 2023-11-07 DIAGNOSIS — M8580 Other specified disorders of bone density and structure, unspecified site: Secondary | ICD-10-CM

## 2023-11-07 DIAGNOSIS — I739 Peripheral vascular disease, unspecified: Secondary | ICD-10-CM

## 2023-11-07 DIAGNOSIS — K219 Gastro-esophageal reflux disease without esophagitis: Secondary | ICD-10-CM

## 2023-11-07 LAB — POCT GLYCOSYLATED HEMOGLOBIN (HGB A1C): Hemoglobin A1C: 6.3 % — AB (ref 4.0–5.6)

## 2023-11-07 MED ORDER — SIMVASTATIN 40 MG PO TABS: 40.0000 mg | ORAL_TABLET | Freq: Every day | ORAL | 3 refills | Status: AC

## 2023-11-07 MED ORDER — LOSARTAN POTASSIUM-HCTZ 50-12.5 MG PO TABS
1.0000 | ORAL_TABLET | Freq: Every day | ORAL | 3 refills | Status: AC
Start: 2023-11-07 — End: ?

## 2023-11-07 NOTE — Patient Instructions (Addendum)
  Ms. Phyllis Campbell , Thank you for taking time to come for your Medicare Wellness Visit. I appreciate your ongoing commitment to your health goals. Please review the following plan we discussed and let me know if I can assist you in the future.   These are the goals we discussed:   Try Pepcid or Axid on a regular basis help with your heartburn This is a list of the screening recommended for you and due dates:  Health Maintenance  Topic Date Due   Eye exam for diabetics  05/04/2020   Complete foot exam   10/16/2022   COVID-19 Vaccine (7 - 2024-25 season) 06/09/2023   Medicare Annual Wellness Visit  10/24/2023   Hemoglobin A1C  11/08/2023   DTaP/Tdap/Td vaccine (4 - Td or Tdap) 02/20/2027   Pneumonia Vaccine  Completed   Flu Shot  Completed   DEXA scan (bone density measurement)  Completed   Zoster (Shingles) Vaccine  Completed   HPV Vaccine  Aged Out

## 2023-11-07 NOTE — Progress Notes (Signed)
Phyllis Campbell is a 89 y.o. female who presents for CPE annual wellness visit and follow-up on chronic medical conditions.  She has no particular concerns or complaints.  She does have underlying allergies and a seem to be under good control.  She keeps herself physically active so the arthritis is not causing much difficulty.  She is riding a bike but states her legs seem to be doing fairly well. She does complain of some reflux disease and does use OTC meds for this.  Mainly uses MiraLAX which does help.  She was on Fosamax however recent DEXA scan shows she is now osteopenic.  She continues on Hyzaar as well as Zocor and having no difficulty with those medications.  She is about to turn 89. Immunizations and Health Maintenance Immunization History  Administered Date(s) Administered   DTaP 08/11/1998   Fluad Quad(high Dose 65+) 06/18/2019, 10/03/2020, 08/10/2021, 10/23/2022   Fluad Trivalent(High Dose 65+) 06/12/2023   Influenza Split 07/09/2011, 07/30/2012   Influenza Whole 09/10/2006, 09/16/2007, 08/09/2009   Influenza, High Dose Seasonal PF 07/28/2013, 08/10/2014, 08/16/2015, 06/13/2016, 07/10/2017, 08/07/2018   PFIZER Comirnaty(Gray Top)Covid-19 Tri-Sucrose Vaccine 03/16/2021   PFIZER(Purple Top)SARS-COV-2 Vaccination 12/06/2019, 01/05/2020, 07/12/2020   Pfizer Covid-19 Vaccine Bivalent Booster 57yrs & up 08/10/2021   Pfizer(Comirnaty)Fall Seasonal Vaccine 12 years and older 10/23/2022   Pneumococcal Conjugate-13 08/29/2015   Pneumococcal Polysaccharide-23 05/12/2006, 10/19/2013   Tdap 05/28/2007, 02/19/2017   Zoster Recombinant(Shingrix) 02/19/2017, 05/03/2017   Zoster, Live 05/12/2006   Health Maintenance Due  Topic Date Due   OPHTHALMOLOGY EXAM  05/04/2020   COVID-19 Vaccine (7 - 2024-25 season) 06/09/2023    Last Pap smear: Hysterectomy  Last mammogram: September 2013 Last colonoscopy: Not in the last 20 years Last DEXA: December 2021 Dentist: more than 1 year ago  Ophtho:  August 2024 Exercise: rowing machine and leg exercises in bed  Other doctors caring for patient include: none   Advanced directives: Does Patient Have a Medical Advance Directive?: Yes Type of Advance Directive: Healthcare Power of [place], Living will, Out of facility DNR (pink MOST or yellow form) Does patient want to make changes to medical advance directive?: No - Patient declined  Depression screen:  See questionnaire below.     11/07/2023    9:30 AM 10/23/2022   10:37 AM 10/16/2021    8:29 AM 10/13/2020    1:48 PM 05/17/2020    8:24 AM  Depression screen PHQ 2/9  Decreased Interest 0 0 0 0 1  Down, Depressed, Hopeless 0 0 0 0 0  PHQ - 2 Score 0 0 0 0 1    Fall Risk Screen: see questionnaire below.    11/07/2023    9:29 AM 10/23/2022   10:37 AM 10/16/2021    8:28 AM 10/13/2020    1:47 PM 05/17/2020    8:23 AM  Fall Risk   Falls in the past year? 0 1 1 1  0  Number falls in past yr: 0 1 1 0   Injury with Fall? 0 1 0 0   Risk for fall due to :  History of fall(s) Other (Comment) Impaired balance/gait No Fall Risks  Follow up  Falls prevention discussed;Falls evaluation completed Falls evaluation completed      ADL screen:  See questionnaire below Functional Status Survey: Is the patient deaf or have difficulty hearing?: No Does the patient have difficulty seeing, even when wearing glasses/contacts?: No Does the patient have difficulty concentrating, remembering, or making decisions?: No Does the patient have difficulty  walking or climbing stairs?: Yes Does the patient have difficulty dressing or bathing?: Yes (unable to reach back) Does the patient have difficulty doing errands alone such as visiting a doctor's office or shopping?: No   Review of Systems Constitutional: -, -unexpected weight change, -anorexia, -fatigue Allergy: -sneezing, -itching, -congestion Dermatology: denies changing moles, rash, lumps ENT: -runny nose, -ear pain, -sore throat,  Cardiology:  -chest  pain, -palpitations, -orthopnea, Respiratory: -cough, -shortness of breath, -dyspnea on exertion, -wheezing,  Gastroenterology: -abdominal pain, -nausea, -vomiting, -diarrhea, -constipation, -dysphagia Hematology: -bleeding or bruising problems Musculoskeletal: -arthralgias, -myalgias, -joint swelling, -back pain, - Ophthalmology: -vision changes,  Urology: -dysuria, -difficulty urinating,  -urinary frequency, -urgency, incontinence Neurology: -, -numbness, , -memory loss, -falls, -dizziness    PHYSICAL EXAM:   General Appearance: Alert, cooperative, no distress, appears stated age Head: Normocephalic, without obvious abnormality, atraumatic Eyes: PERRL, conjunctiva/corneas clear, EOM's intact, Ears: Normal TM's and external ear canals Nose: Nares normal, mucosa normal, no drainage or sinus tenderness Throat: Lips, mucosa, and tongue normal; teeth and gums normal Neck: Supple, no lymphadenopathy;  thyroid:  no enlargement/tenderness/nodules; no carotid bruit or JVD Lungs: Clear to auscultation bilaterally without wheezes, rales or ronchi; respirations unlabored Heart: Regular rate and rhythm, S1 and S2 normal, no murmur, rubor gallop Extremities: No clubbing, cyanosis or edema.  Diabetic foot exam normal Pulses: 2+ and symmetric all extremities Skin:  Skin color, texture, turgor normal, no rashes or lesions Lymph nodes: Cervical, supraclavicular, and axillary nodes normal Neurologic:  CNII-XII intact, normal strength, sensation and gait; reflexes 2+ and symmetric throughout Psych: Normal mood, affect, hygiene and grooming. Hemoglobin A1c is 6.3 ASSESSMENT/PLAN: Routine general medical examination at a health care facility  Seasonal allergic rhinitis due to pollen  Arthritis  Atherosclerosis of native artery of left lower extremity with intermittent claudication (HCC)  Hyperlipidemia associated with type 2 diabetes mellitus (HCC) - Plan: Lipid panel, simvastatin (ZOCOR) 40 MG  tablet  Hypertension complicating diabetes (HCC) - Plan: losartan-hydrochlorothiazide (HYZAAR) 50-12.5 MG tablet  Osteopenia, unspecified location  PAD (peripheral artery disease) (HCC)  Obesity (BMI 30-39.9)  Lumbar radiculopathy  Need for vaccination against Streptococcus pneumoniae - Plan: Pneumococcal conjugate vaccine 20-valent (Prevnar 20)  DM (diabetes mellitus) with complications (HCC) - Plan: POCT glycosylated hemoglobin (Hb A1C)  Gastroesophageal reflux disease without esophagitis    Discussed  at least 30 minutes of aerobic activity at least 5 days/week and weight-bearing exercise 2x/week;  ; healthy diet, including goals of calcium and vitamin D intake s.  Immunization recommendations discussed.  Encouraged her to continue with her exercise program and closely check her feet on a regular basis.  She does have an eye exam set up in the near future.  Also recommend she try Axid or Pepcid to help with her reflux Medicare Attestation I have personally reviewed: The patient's medical and social history Their use of alcohol, tobacco or illicit drugs Their current medications and supplements The patient's functional ability including ADLs,fall risks, home safety risks, cognitive, and hearing and visual impairment Diet and physical activities Evidence for depression or mood disorders  The patient's weight, height, and BMI have been recorded in the chart.  I have made referrals, counseling, and provided education to the patient based on review of the above and I have provided the patient with a written personalized care plan for preventive services.     Sharlot Gowda, MD   11/07/2023

## 2023-11-08 ENCOUNTER — Encounter: Payer: Self-pay | Admitting: Family Medicine

## 2023-11-08 LAB — LIPID PANEL
Chol/HDL Ratio: 2.1 {ratio} (ref 0.0–4.4)
Cholesterol, Total: 129 mg/dL (ref 100–199)
HDL: 61 mg/dL (ref >39)
LDL Chol Calc (NIH): 55 mg/dL (ref 0–99)
Triglycerides: 58 mg/dL (ref 0–149)
VLDL Cholesterol Cal: 13 mg/dL (ref 5–40)

## 2023-12-03 ENCOUNTER — Encounter: Payer: Self-pay | Admitting: Internal Medicine

## 2023-12-12 ENCOUNTER — Ambulatory Visit: Payer: Self-pay | Admitting: Family Medicine

## 2023-12-12 NOTE — Telephone Encounter (Signed)
 Pt was offered an appt for tomorrow and declined. She can not get a ride before Tuesday

## 2023-12-12 NOTE — Telephone Encounter (Signed)
 Copied from CRM [id number]. Topic: Clinical - Red Word Triage >> Dec 12, 2023 11:49 AM Elle L wrote: Red Word that prompted transfer to Nurse Triage: The patient has pain that is shooting down her back, legs, and thighs when trying to walk that has gotten worse since her last appointment.  Chief Complaint: severe lower back pain Symptoms: shooting down legs Frequency: 1 month Pertinent Negatives: Patient denies incontinence, fever, urination pain Disposition: [] ED /[] Urgent Care (no appt availability in office) / [x] Appointment(In office/virtual)/ []  [facility]/ [] Home Care/ [] Refused Recommended Disposition /[] [facility]/ []  Follow-up with PCP Additional Notes: pt  stated she needed to check with her daughter about when she can come- advised I could make appt for UC. Pt refused at this time.  Reason for Disposition  [1] SEVERE back pain (e.g., excruciating, unable to do any normal activities) AND [2] not improved 2 hours after pain medicine  Answer Assessment - Initial Assessment Questions 1. ONSET: "When did the pain begin?"      1 month  2. LOCATION: "Where does it hurt?" (upper, mid or lower back)     Lower back  3. SEVERITY: "How bad is the pain?"  (e.g., Scale 1-10; mild, moderate, or severe)   - MILD (1-3): Doesn't interfere with normal activities.    - MODERATE (4-7): Interferes with normal activities or awakens from sleep.    - SEVERE (8-10): Excruciating pain, unable to do any normal activities.      severe 4. PATTERN: "Is the pain constant?" (e.g., yes, no; constant, intermittent)      constant 5. RADIATION: "Does the pain shoot into your legs or somewhere else?"     yes 6. CAUSE:  "What do you think is causing the back pain?"      scoliosis  9. NEUROLOGIC SYMPTOMS: "Do you have any weakness, numbness, or problems with bowel/bladder control?"     Weakness/ bladder control  10. OTHER SYMPTOMS: "Do you have any other symptoms?" (e.g., fever, abdomen  pain, burning with urination, blood in urine)       no  Protocols used: Back Pain-A-AH

## 2023-12-17 ENCOUNTER — Ambulatory Visit: Admitting: Medical

## 2023-12-17 VITALS — BP 110/70 | HR 75 | Wt 197.6 lb

## 2023-12-17 DIAGNOSIS — G8929 Other chronic pain: Secondary | ICD-10-CM

## 2023-12-17 DIAGNOSIS — M1710 Unilateral primary osteoarthritis, unspecified knee: Secondary | ICD-10-CM | POA: Diagnosis not present

## 2023-12-17 DIAGNOSIS — M545 Low back pain, unspecified: Secondary | ICD-10-CM | POA: Diagnosis not present

## 2023-12-17 DIAGNOSIS — M25561 Pain in right knee: Secondary | ICD-10-CM | POA: Diagnosis not present

## 2023-12-17 DIAGNOSIS — M25562 Pain in left knee: Secondary | ICD-10-CM | POA: Diagnosis not present

## 2023-12-17 DIAGNOSIS — R296 Repeated falls: Secondary | ICD-10-CM

## 2023-12-17 NOTE — Patient Instructions (Signed)
 Chronic back pain, arthritis, chronic knee pain and arthritis, falls  Recommendations: I am referring you for home health evaluation with physical therapy to address your pains and falls Consider referral back to orthopedics for further check-in on your knees and back.  Your last visit with orthopedics for these issues was almost 5 years ago For pain you can use topical creams such as capsaicin cream, Aspercreme or over-the-counter gels You can use oral Tylenol 325 mg or 500 mg twice daily for pain as needed I recommend you use your cane or walker for the time being to avoid falls If pain continues to worsen we may need to get you back in orthopedics for other treatment options  If you have not heard back about a phone call about the physical therapy within the next 7 days then let us know

## 2023-12-17 NOTE — Progress Notes (Signed)
 Subjective:  Phyllis Campbell is a 89 y.o. female who presents for Chief Complaint  Patient presents with   Leg Pain    Knee and Leg pain on both sides. Been going on a couple months. Falling alot     Here for concerns about her knee and back and falls.  Here with her daughter who helps as a caregiver for her.  Her daughter works from home and Phyllis Campbell lives with her daughter.  She notes ongoing issues with arthritis in both knees and her low back.  She has had some recent falls where her knees just, gave out on her.  No recent swelling but she feels weak in her knees and pain in her knees as well as her back.  No particular hip pain.  She does use a cane.  She has a walker but does not seem to use it that well, feels too weak to even use it.  They are requesting a power scooter to mobilize in the house  She denies any numbness in the legs, no fever, no incontinence of urine or stool, no bruising.  Uses Tylenol every now and then for pain.  No other aggravating or relieving factors.    No other c/o.  Past Medical History:  Diagnosis Date   Allergy    Arthritis    Cataracts, bilateral    Diabetes mellitus    DVT (deep venous thrombosis) (HCC)    Dyslipidemia    HH (hiatus hernia)    Hypertension    Obesity    Postmenopausal    Stroke (HCC)    Thyromegaly    Current Outpatient Medications on File Prior to Visit  Medication Sig Dispense Refill   aspirin EC 81 MG tablet Take 81 mg by mouth every evening.     Calcium Carb-Cholecalciferol (CALCIUM 600+D3 PO) Take 1 tablet by mouth daily.     Chlorpheniramine-DM (CORICIDIN HBP COUGH/COLD PO) Take 1 tablet by mouth every 8 (eight) hours as needed (for cold symptoms).     losartan-hydrochlorothiazide (HYZAAR) 50-12.5 MG tablet Take 1 tablet by mouth daily. 90 tablet 3   meclizine (ANTIVERT) 12.5 MG tablet TAKE 1 TABLET BY MOUTH THREE TIMES DAILY AS NEEDED FOR DIZZINESS 30 tablet 0   polyethylene glycol (MIRALAX / GLYCOLAX) 17 g  packet Take 17 g by mouth daily. 14 each 0   simvastatin (ZOCOR) 40 MG tablet Take 1 tablet (40 mg total) by mouth daily. 90 tablet 3   albuterol (VENTOLIN HFA) 108 (90 Base) MCG/ACT inhaler Inhale 1-2 puffs into the lungs every 4 (four) hours as needed for wheezing or shortness of breath. (Patient not taking: Reported on 05/28/2023) 8 g 3   No current facility-administered medications on file prior to visit.     The following portions of the patient's history were reviewed and updated as appropriate: allergies, current medications, past family history, past medical history, past social history, past surgical history and problem list.  ROS Otherwise as in subjective above    Objective: BP 110/70   Pulse 75   Wt 197 lb 9.6 oz (89.6 kg)   BMI 33.14 kg/m   General appearance: alert, no distress, well developed, well nourished Back with somewhat limited range of motion due to pain, not particular tender on exam Legs: Bony arthritic changes of both knees, no obvious swelling, no obvious laxity, pain with varus or valgus stress Leg strength seems fairly strong Legs neurovascularly intact Somewhat cautious with walking in general Psych: Pleasant, answers  questions appropriately    Lumbar spine xray 11/13/22 IMPRESSION: 1. No acute findings. 2. Moderate spondylosis of the lumbar spine with multilevel disc disease without significant change. Moderate curvature of the lumbar spine convex right slightly more apparent compared to the prior exam.   MR Knee left, 04/02/22 IMPRESSION: 1. No acute osseous abnormality of the left knee. 2. Tricompartmental osteoarthritis, most severe in the patellofemoral compartment. 3. Moderate-sized knee joint effusion and Baker's cyst. Curvilinear 2.0 cm loose body within the Baker's cyst, likely free cartilage fragment. 4. Intrasubstance degeneration of the medial and lateral menisci with free edge and undersurface fraying of the medial meniscal  body and posterior horn. 5. Grade 1-2 sprain of the fibular collateral ligament, age indeterminate.   MRI Lumbar spine 04/25/18 IMPRESSION: Worsened spondylosis at L4-5 where there is severe narrowing in the right subarticular recess and impingement on the descending right L5 root due to degenerative disc and facet arthropathy. Moderately severe right foraminal narrowing is also seen at this level.   Worsened spondylosis at L5-S1 where there is narrowing in the right subarticular recess and impingement on the descending right S1 root due to disc, ligamentum flavum thickening and facet arthropathy. Mild to moderate bilateral foraminal narrowing is present this level.   Worsened spondylosis at L3-4 where a disc bulge eccentric to the right results in some narrowing in the right subarticular recess and moderate to moderately severe central canal stenosis overall. Facet arthropathy and ligamentum flavum thickening are also present this level.   Worsened spondylosis at L2-3 where there is moderate central canal stenosis and narrowing in the left subarticular recess.   Severe convex right scoliosis.    Knee right xray 05/18/15 IMPRESSION: Tricompartment degenerative change. Old bone infarct distal femur. No acute abnormality.    Assessment: Encounter Diagnoses  Name Primary?   Chronic pain of both knees Yes   Arthritis of knee    Chronic bilateral low back pain without sciatica    Falls      Plan: We discussed her symptoms and concerns.  I reviewed her last back imaging which was lumbar spine x-ray 11/15/2022 and MRI lumbar spine from July 2019  I reviewed her last knee imaging, MRI of left knee June 2023, right knee x-ray from August 2016  She has known arthritis in knees and back.  I advised that we start with physical therapy consult for home health eval and some home health physical therapy to help with her complaints listed above.  We discussed supportive measures  including topical arthritis creams and oral Tylenol.  Advised against NSAIDs given her age and to protect the kidney  I recommended going ahead and referring back to orthopedic but she declines that for now  Phyllis Campbell was seen today for leg pain.  Diagnoses and all orders for this visit:  Chronic pain of both knees  Arthritis of knee  Chronic bilateral low back pain without sciatica  Falls    Follow up: 3 to 4 weeks with your PCP after doing physical therapy

## 2023-12-17 NOTE — Addendum Note (Signed)
 Addended by: Herminio Commons A on: 12/17/2023 11:43 AM   Modules accepted: Orders

## 2023-12-20 ENCOUNTER — Telehealth: Payer: Self-pay | Admitting: Internal Medicine

## 2023-12-20 NOTE — Telephone Encounter (Signed)
 Copied from CRM [id number]. Topic: General - Other >> Dec 20, 2023  8:36 AM Izetta Dakin wrote: Reason for CRM: Patient requesting start date on Monday 12/23/2023 Stacey at  [facility]  Callback [phone]

## 2023-12-23 ENCOUNTER — Telehealth: Payer: Self-pay

## 2023-12-23 DIAGNOSIS — I10 Essential (primary) hypertension: Secondary | ICD-10-CM | POA: Diagnosis not present

## 2023-12-23 DIAGNOSIS — M170 Bilateral primary osteoarthritis of knee: Secondary | ICD-10-CM | POA: Diagnosis not present

## 2023-12-23 DIAGNOSIS — M48061 Spinal stenosis, lumbar region without neurogenic claudication: Secondary | ICD-10-CM | POA: Diagnosis not present

## 2023-12-23 DIAGNOSIS — E7849 Other hyperlipidemia: Secondary | ICD-10-CM | POA: Diagnosis not present

## 2023-12-23 DIAGNOSIS — E1169 Type 2 diabetes mellitus with other specified complication: Secondary | ICD-10-CM | POA: Diagnosis not present

## 2023-12-23 DIAGNOSIS — I70219 Atherosclerosis of native arteries of extremities with intermittent claudication, unspecified extremity: Secondary | ICD-10-CM | POA: Diagnosis not present

## 2023-12-23 DIAGNOSIS — E1151 Type 2 diabetes mellitus with diabetic peripheral angiopathy without gangrene: Secondary | ICD-10-CM | POA: Diagnosis not present

## 2023-12-23 DIAGNOSIS — M4726 Other spondylosis with radiculopathy, lumbar region: Secondary | ICD-10-CM | POA: Diagnosis not present

## 2023-12-23 DIAGNOSIS — E1136 Type 2 diabetes mellitus with diabetic cataract: Secondary | ICD-10-CM | POA: Diagnosis not present

## 2023-12-23 NOTE — Telephone Encounter (Signed)
 If ok we can call back and give verbal order.   Copied from CRM [id number]. Topic: Clinical - Home Health Verbal Orders >> Dec 23, 2023 10:50 AM Priscille Loveless wrote: Caller/Agency: Stacy with [facility] Callback Number: [phone] Service Requested: Physical Therapy Frequency: 1x3 weeks Any new concerns about the patient? No

## 2023-12-23 NOTE — Telephone Encounter (Signed)
 Phyllis Campbell with home health was notified

## 2023-12-27 ENCOUNTER — Telehealth: Payer: Self-pay | Admitting: Internal Medicine

## 2023-12-27 NOTE — Telephone Encounter (Signed)
 Gave Verbal ok  Copied from CRM [id number]. Topic: Clinical - Home Health Verbal Orders >> Dec 27, 2023 10:57 AM Alessandra Bevels wrote: Malerie with OT [facility] is calling to request an order to move assessment to next week. CB- [phone] Verbal ok on VM

## 2024-01-03 ENCOUNTER — Telehealth: Payer: Self-pay | Admitting: *Deleted

## 2024-01-03 NOTE — Telephone Encounter (Signed)
 Copied from CRM [id number]. Topic: Clinical - Home Health Verbal Orders >> Jan 03, 2024  1:14 PM Priscille Loveless wrote: Caller/Agency: Mallorie with [facility] Callback Number: [phone] Service Requested: Occupational Therapy Frequency: evaluation, could not get in touch with her this week. Any new concerns about the patient? No

## 2024-01-06 ENCOUNTER — Telehealth: Payer: Self-pay | Admitting: Internal Medicine

## 2024-01-06 NOTE — Telephone Encounter (Unsigned)
 Copied from CRM [id number]. Topic: Clinical - Home Health Verbal Orders >> Jan 06, 2024  3:41 PM Carlatta H wrote: Caller/Agency: Berna Bue Number: [phone] Service Requested: Occupational Therapy Frequency: Order to move home health assessment to this week Any new concerns about the patient? No

## 2024-01-08 DIAGNOSIS — M170 Bilateral primary osteoarthritis of knee: Secondary | ICD-10-CM | POA: Diagnosis not present

## 2024-01-08 DIAGNOSIS — I10 Essential (primary) hypertension: Secondary | ICD-10-CM | POA: Diagnosis not present

## 2024-01-08 DIAGNOSIS — M4726 Other spondylosis with radiculopathy, lumbar region: Secondary | ICD-10-CM | POA: Diagnosis not present

## 2024-01-08 DIAGNOSIS — E1151 Type 2 diabetes mellitus with diabetic peripheral angiopathy without gangrene: Secondary | ICD-10-CM | POA: Diagnosis not present

## 2024-01-08 DIAGNOSIS — M48061 Spinal stenosis, lumbar region without neurogenic claudication: Secondary | ICD-10-CM | POA: Diagnosis not present

## 2024-01-08 DIAGNOSIS — E1169 Type 2 diabetes mellitus with other specified complication: Secondary | ICD-10-CM | POA: Diagnosis not present

## 2024-01-08 DIAGNOSIS — I70219 Atherosclerosis of native arteries of extremities with intermittent claudication, unspecified extremity: Secondary | ICD-10-CM | POA: Diagnosis not present

## 2024-01-08 DIAGNOSIS — E1136 Type 2 diabetes mellitus with diabetic cataract: Secondary | ICD-10-CM | POA: Diagnosis not present

## 2024-01-08 DIAGNOSIS — E7849 Other hyperlipidemia: Secondary | ICD-10-CM | POA: Diagnosis not present

## 2024-01-08 NOTE — Telephone Encounter (Signed)
 Phyllis Campbell is full this week, so gave authorization to try for next week

## 2024-01-08 NOTE — Telephone Encounter (Signed)
 Mallory called back when you were with a patient  Call back number [phone]

## 2024-01-08 NOTE — Telephone Encounter (Signed)
 Left message for [facility] to call me back

## 2024-01-09 DIAGNOSIS — E1136 Type 2 diabetes mellitus with diabetic cataract: Secondary | ICD-10-CM | POA: Diagnosis not present

## 2024-01-09 DIAGNOSIS — E7849 Other hyperlipidemia: Secondary | ICD-10-CM | POA: Diagnosis not present

## 2024-01-09 DIAGNOSIS — E1169 Type 2 diabetes mellitus with other specified complication: Secondary | ICD-10-CM

## 2024-01-09 DIAGNOSIS — G4733 Obstructive sleep apnea (adult) (pediatric): Secondary | ICD-10-CM

## 2024-01-09 DIAGNOSIS — M8580 Other specified disorders of bone density and structure, unspecified site: Secondary | ICD-10-CM

## 2024-01-09 DIAGNOSIS — E1151 Type 2 diabetes mellitus with diabetic peripheral angiopathy without gangrene: Secondary | ICD-10-CM | POA: Diagnosis not present

## 2024-01-09 DIAGNOSIS — I10 Essential (primary) hypertension: Secondary | ICD-10-CM | POA: Diagnosis not present

## 2024-01-09 DIAGNOSIS — M48061 Spinal stenosis, lumbar region without neurogenic claudication: Secondary | ICD-10-CM | POA: Diagnosis not present

## 2024-01-09 DIAGNOSIS — E669 Obesity, unspecified: Secondary | ICD-10-CM

## 2024-01-09 DIAGNOSIS — I70219 Atherosclerosis of native arteries of extremities with intermittent claudication, unspecified extremity: Secondary | ICD-10-CM | POA: Diagnosis not present

## 2024-01-09 DIAGNOSIS — M4726 Other spondylosis with radiculopathy, lumbar region: Secondary | ICD-10-CM | POA: Diagnosis not present

## 2024-01-09 DIAGNOSIS — M170 Bilateral primary osteoarthritis of knee: Secondary | ICD-10-CM | POA: Diagnosis not present

## 2024-03-05 ENCOUNTER — Emergency Department (HOSPITAL_COMMUNITY)
Admission: EM | Admit: 2024-03-05 | Discharge: 2024-03-06 | Disposition: A | Discharge status: Home / Self Care | Attending: Emergency Medicine | Admitting: Emergency Medicine

## 2024-03-05 ENCOUNTER — Ambulatory Visit: Payer: Self-pay

## 2024-03-05 ENCOUNTER — Other Ambulatory Visit: Payer: Self-pay

## 2024-03-05 DIAGNOSIS — I672 Cerebral atherosclerosis: Secondary | ICD-10-CM | POA: Diagnosis not present

## 2024-03-05 DIAGNOSIS — R208 Other disturbances of skin sensation: Secondary | ICD-10-CM

## 2024-03-05 DIAGNOSIS — Z79899 Other long term (current) drug therapy: Secondary | ICD-10-CM | POA: Diagnosis not present

## 2024-03-05 DIAGNOSIS — R519 Headache, unspecified: Secondary | ICD-10-CM | POA: Diagnosis not present

## 2024-03-05 DIAGNOSIS — Z7982 Long term (current) use of aspirin: Secondary | ICD-10-CM | POA: Diagnosis not present

## 2024-03-05 DIAGNOSIS — I10 Essential (primary) hypertension: Secondary | ICD-10-CM | POA: Diagnosis not present

## 2024-03-05 DIAGNOSIS — R201 Hypoesthesia of skin: Secondary | ICD-10-CM | POA: Diagnosis not present

## 2024-03-05 DIAGNOSIS — E119 Type 2 diabetes mellitus without complications: Secondary | ICD-10-CM | POA: Diagnosis not present

## 2024-03-05 DIAGNOSIS — R200 Anesthesia of skin: Secondary | ICD-10-CM | POA: Diagnosis not present

## 2024-03-05 LAB — CBC
HCT: 38.1 % (ref 36.0–46.0)
Hemoglobin: 12.4 g/dL (ref 12.0–15.0)
MCH: 31.1 pg (ref 26.0–34.0)
MCHC: 32.5 g/dL (ref 30.0–36.0)
MCV: 95.5 fL (ref 80.0–100.0)
Platelets: 193 10*3/uL (ref 150–400)
RBC: 3.99 MIL/uL (ref 3.87–5.11)
RDW: 13.9 % (ref 11.5–15.5)
WBC: 3.6 10*3/uL — ABNORMAL LOW (ref 4.0–10.5)
nRBC: 0 % (ref 0.0–0.2)

## 2024-03-05 LAB — COMPREHENSIVE METABOLIC PANEL WITH GFR
ALT: 16 U/L (ref 0–44)
AST: 24 U/L (ref 15–41)
Albumin: 4 g/dL (ref 3.5–5.0)
Alkaline Phosphatase: 73 U/L (ref 38–126)
Anion gap: 9 (ref 5–15)
BUN: 15 mg/dL (ref 8–23)
CO2: 26 mmol/L (ref 22–32)
Calcium: 10.3 mg/dL (ref 8.9–10.3)
Chloride: 106 mmol/L (ref 98–111)
Creatinine, Ser: 0.79 mg/dL (ref 0.44–1.00)
GFR, Estimated: >60 mL/min (ref >60)
Glucose, Bld: 87 mg/dL (ref 70–99)
Potassium: 3.8 mmol/L (ref 3.5–5.1)
Sodium: 141 mmol/L (ref 135–145)
Total Bilirubin: 0.5 mg/dL (ref 0.0–1.2)
Total Protein: 7.3 g/dL (ref 6.5–8.1)

## 2024-03-05 NOTE — ED Triage Notes (Signed)
 Pt arrives POV c/o of intermittent numbness and tingling in her left hand that started on Monday. Pt has had the same feeling in her right hand for years but her PCP called her to tell her to come to the hospital. Denies other sx at this time

## 2024-03-05 NOTE — Telephone Encounter (Signed)
  Chief Complaint: R leg numbness yesterday, resolved throughout the day Symptoms: numbness Frequency: yesterday Pertinent Negatives: Patient denies fever, SOB, CP, speech changes, vision changes, HA,  Disposition: [x] ED /[] Urgent Care (no appt availability in office) / [] Appointment(In office/virtual)/ []  [place] [facility]/ [] Home Care/ [] Refused Recommended Disposition /[]  Mobile Bus/ []  Follow-up with PCP Additional Notes: Pt states that she has had ongoing L hand numbness for about a week. Pt states that yesterday her R Leg became numb. Pt states that she stayed in bed all day and the numbness in the R leg resolved throughout the day.  Copied from CRM [id number]. Topic: Clinical - Red Word Triage >> Mar 05, 2024  2:49 PM Carlatta H wrote: Red Word that prompted transfer to Nurse Triage: Patient is having numbness in left hand and right side for about 1 week// Reason for Disposition  [1] Numbness (i.e., loss of sensation) of the face, arm / hand, or leg / foot on one side of the body AND [2] gradual onset (e.g., days to weeks) AND [3] present now  Answer Assessment - Initial Assessment Questions 1. SYMPTOM: "What is the main symptom you are concerned about?" (e.g., weakness, numbness)     L hand numbness x1 week. R Leg numbness yesterday but has resolved-when not standing 4. PATTERN "Does this come and go, or has it been constant since it started?"  "Is it present now?"     Numbness relieved with rest, supine positioning 5. CARDIAC SYMPTOMS: "Have you had any of the following symptoms: chest pain, difficulty breathing, palpitations?"     denies 6. NEUROLOGIC SYMPTOMS: "Have you had any of the following symptoms: headache, dizziness, vision loss, double vision, changes in speech, unsteady on your feet?"     Denies,  7. OTHER SYMPTOMS: "Do you have any other symptoms?"     denies  Protocols used: Neurologic Deficit-A-AH

## 2024-03-05 NOTE — Telephone Encounter (Signed)
 Offer appt verses ED

## 2024-03-05 NOTE — Telephone Encounter (Signed)
 Left message for to call office, we have openings with Dr Robina Chol tomorrow

## 2024-03-06 ENCOUNTER — Emergency Department (HOSPITAL_COMMUNITY)

## 2024-03-06 DIAGNOSIS — R519 Headache, unspecified: Secondary | ICD-10-CM | POA: Diagnosis not present

## 2024-03-06 DIAGNOSIS — I672 Cerebral atherosclerosis: Secondary | ICD-10-CM | POA: Diagnosis not present

## 2024-03-06 DIAGNOSIS — R200 Anesthesia of skin: Secondary | ICD-10-CM | POA: Diagnosis not present

## 2024-03-06 NOTE — ED Provider Notes (Signed)
 [place] EMERGENCY DEPARTMENT AT [facility] Provider Note   CSN: [id number] Arrival date & time: 03/05/24  1651     History  No chief complaint on file.   Phyllis Campbell is a 89 y.o. female with past medical history of HTN, T2DM, HLD, PAD presents to ED for evaluation of numbness of both hands that has been worsening for past week but has been intermittently occurring over several months. Also complains of mild 4/10 HA. Denies recent falls, thinners, blurred vision, neck pain, fevers  HPI     Home Medications Prior to Admission medications   Medication Sig Start Date End Date Taking? Authorizing Provider  albuterol  (VENTOLIN  HFA) 108 (90 Base) MCG/ACT inhaler Inhale 1-2 puffs into the lungs every 4 (four) hours as needed for wheezing or shortness of breath. Patient not taking: Reported on 05/28/2023 11/27/22   Annella Kief, NP  aspirin  EC 81 MG tablet Take 81 mg by mouth every evening.    [provider]  Calcium Carb-Cholecalciferol (CALCIUM 600+D3 PO) Take 1 tablet by mouth daily.    [provider]  Chlorpheniramine-DM (CORICIDIN HBP COUGH/COLD PO) Take 1 tablet by mouth every 8 (eight) hours as needed (for cold symptoms).    [provider]  losartan -hydrochlorothiazide  (HYZAAR) 50-12.5 MG tablet Take 1 tablet by mouth daily. 11/07/23   Watson Hacking, MD  meclizine  (ANTIVERT ) 12.5 MG tablet TAKE 1 TABLET BY MOUTH THREE TIMES DAILY AS NEEDED FOR DIZZINESS 04/25/23   Lalonde, John C, MD  polyethylene glycol (MIRALAX  / GLYCOLAX ) 17 g packet Take 17 g by mouth daily. 04/05/22   Lesa Rape, MD  simvastatin  (ZOCOR ) 40 MG tablet Take 1 tablet (40 mg total) by mouth daily. 11/07/23   Lalonde, John C, MD      Allergies    Tramadol     Review of Systems   Review of Systems  Constitutional:  Negative for chills, fatigue and fever.  Respiratory:  Negative for cough, chest tightness, shortness of breath and wheezing.   Cardiovascular:  Negative for  chest pain and palpitations.  Gastrointestinal:  Negative for abdominal pain, constipation, diarrhea, nausea and vomiting.  Neurological:  Negative for dizziness, seizures, weakness, light-headedness, numbness and headaches.    Physical Exam Updated Vital Signs BP (!) 154/65 (BP Location: Left Arm)   Pulse 72   Temp 97.7 F (36.5 C) (Oral)   Resp 14   Ht 5\' 4"  (1.626 m)   SpO2 99%   BMI 33.92 kg/m  Physical Exam Vitals and nursing note reviewed.  Constitutional:      General: She is not in acute distress.    Appearance: Normal appearance.  HENT:     Head: Normocephalic and atraumatic.  Eyes:     General: Lids are normal. Vision grossly intact. No visual field deficit.    Extraocular Movements:     Right eye: Normal extraocular motion and no nystagmus.     Left eye: Normal extraocular motion and no nystagmus.     Conjunctiva/sclera: Conjunctivae normal.  Neck:     Comments: No meningismus Cardiovascular:     Rate and Rhythm: Normal rate.  Pulmonary:     Effort: Pulmonary effort is normal. No respiratory distress.  Musculoskeletal:     Cervical back: Full passive range of motion without pain. No rigidity. No spinous process tenderness or muscular tenderness. Normal range of motion.  Skin:    Capillary Refill: Capillary refill takes less than 2 seconds.     Coloration:  Skin is not jaundiced or pale.  Neurological:     Mental Status: She is alert and oriented to person, place, and time. Mental status is at baseline.     GCS: GCS eye subscore is 4. GCS verbal subscore is 5. GCS motor subscore is 6.     Cranial Nerves: No cranial nerve deficit, dysarthria or facial asymmetry.     Sensory: No sensory deficit.     Motor: No weakness, tremor, abnormal muscle tone, seizure activity or pronator drift.     Coordination: Coordination normal. Finger-Nose-Finger Test and Heel to [facility] Test normal.     Comments: Sensation 2/2 and equal of BUE and BLE. Motor 5/5 of BUE and BLE.  No  slurred speech nor pronator drift.  Grip strength equal     ED Results / Procedures / Treatments   Labs (all labs ordered are listed, but only abnormal results are displayed) Labs Reviewed  CBC - Abnormal; Notable for the following components:      Result Value   WBC 3.6 (*)    All other components within normal limits  COMPREHENSIVE METABOLIC PANEL WITH GFR    EKG None  Radiology CT Head Wo Contrast Result Date: 03/06/2024 CLINICAL DATA:  Left-sided numbness, headache EXAM: CT HEAD WITHOUT CONTRAST TECHNIQUE: Contiguous axial images were obtained from the base of the skull through the vertex without intravenous contrast. RADIATION DOSE REDUCTION: This exam was performed according to the departmental dose-optimization program which includes automated exposure control, adjustment of the mA and/or kV according to patient size and/or use of iterative reconstruction technique. COMPARISON:  CT head 04/01/2022 FINDINGS: Brain: No intracranial hemorrhage, mass effect, or evidence of acute infarct. No hydrocephalus. No extra-axial fluid collection. Age-commensurate cerebral atrophy and chronic small vessel ischemic disease. Vascular: No hyperdense vessel. Intracranial arterial calcification. Skull: No fracture or focal lesion. Sinuses/Orbits: No acute finding. Other: None. IMPRESSION: No acute intracranial abnormality. Electronically Signed   By: Rozell Cornet M.D.   On: 03/06/2024 03:27    Procedures Procedures    Medications Ordered in ED Medications - No data to display  ED Course/ Medical Decision Making/ A&P                                 Medical Decision Making Amount and/or Complexity of Data Reviewed Labs: ordered. Radiology: ordered.   Patient presents to the ED for concern of bilateral upper extremity numbness, headache, this involves an extensive number of treatment options, and is a complaint that carries with it a high risk of complications and morbidity.  The  differential diagnosis includes migraine, cervical radiculopathy, CVA, TIA, DDD, ischemic limb, Raynaud's   Co morbidities that complicate the patient evaluation  See HPI   Additional history obtained:  Additional history obtained from Nursing   External records from outside source obtained and reviewed including triage note   Lab Tests:  I Ordered, and personally interpreted labs.  The pertinent results include:   CMP and CBC WNL   Imaging Studies ordered:  I ordered imaging studies including CT head without contrast I independently visualized and interpreted imaging which showed no acute intracranial abnormality I agree with the radiologist interpretation    Problem List / ED Course:  Decreased sensation of BUE Light and sharp sensation intact.  No numbness.  Sensation equal of bilateral upper extremity. No motor deficits. Ambulates wo difficulty No neck tenderness. No meningismus. No palpable masses nor crepitus  of cervical spine Well perfused extremities HA No blurred vision No HTN crisis Does not want analgesia for HA as it is mild Hemodynamically stable with no fever CT neg   Reevaluation:  After the interventions noted above, I reevaluated the patient and found that they have :stayed the same     Dispostion:  After consideration of the diagnostic results and the patients response to treatment, I feel that the patent would benefit from outpatient management with PCP follow-up.   Discussed ED workup, disposition, return to ED precautions with patient who expresses understanding agrees with plan.  All questions answered to their satisfaction.  They are agreeable to plan.  Discharge instructions provided on paperwork  Dr. Harless Lien individually assessed patient and reviewed ED workup. He agrees with plan Final Clinical Impression(s) / ED Diagnoses Final diagnoses:  Decreased sensation of hand and upper extremity    Rx / DC Orders ED Discharge Orders      None         Royann Cords, PA 03/06/24 [id number]    Edson Graces, MD 03/10/24 [phone]

## 2024-03-06 NOTE — ED Notes (Signed)
 Patient transported to CT

## 2024-03-06 NOTE — Discharge Instructions (Addendum)
 Thank you relates evaluate you today.  Your CT imaging did not show any bleed.  Your lab work is unremarkable.  Please make sure to take MiraLAX , stool softeners, plenty of water, Metamucil for constipation.  Please follow-up with PCP for further management

## 2024-03-06 NOTE — ED Notes (Signed)
 AVS provided by edp was reviewed with pt. Pt verbalized understanding with no additional questions at this time. Pt wheelchair to car by NT [facility]. Pt going home with daughter at bedside

## 2024-04-06 ENCOUNTER — Encounter: Payer: Self-pay | Admitting: Family Medicine

## 2024-04-06 ENCOUNTER — Ambulatory Visit: Payer: Self-pay

## 2024-04-06 ENCOUNTER — Ambulatory Visit: Admitting: Family Medicine

## 2024-04-06 VITALS — BP 130/80 | HR 80

## 2024-04-06 DIAGNOSIS — K5641 Fecal impaction: Secondary | ICD-10-CM

## 2024-04-06 DIAGNOSIS — K5909 Other constipation: Secondary | ICD-10-CM

## 2024-04-06 MED ORDER — LINACLOTIDE 72 MCG PO CAPS
72.0000 ug | ORAL_CAPSULE | Freq: Every day | ORAL | Status: DC
Start: 2024-04-06 — End: 2024-04-20

## 2024-04-06 MED ORDER — LINACLOTIDE 145 MCG PO CAPS
145.0000 ug | ORAL_CAPSULE | Freq: Every day | ORAL | Status: DC
Start: 2024-04-06 — End: 2024-04-20

## 2024-04-06 NOTE — Telephone Encounter (Signed)
 FYI Only or Action Required?: FYI only for provider.  Patient was last seen in primary care on 12/17/2023 by Bulah Alm RAMAN, PA-C. Called Nurse Triage reporting Constipation. Symptoms began several days ago. Interventions attempted: Other: Suppository. Symptoms are: unchanged.  Triage Disposition: See PCP When Office is Open (Within 3 Days)  Patient/caregiver understands and will follow disposition?: Yes  **Appt. Scheduled for 6/30 **                     Copied from CRM [id number]. Topic: Clinical - Medication Question >> Apr 06, 2024  2:24 PM Yolanda T wrote: Reason for CRM: patient stated she is constipated and has stomach pain. Please f/u with patient for medication for symptoms as there were no appts before 7/9. Reason for Disposition  Unable to have a bowel movement (BM) without manually removing stool (using finger to pull out stool or perform disimpaction)  Answer Assessment - Initial Assessment Questions 1. STOOL PATTERN OR FREQUENCY: How often do you have a bowel movement (BM)?  (Normal range: 3 times a day to every 3 days)  When was your last BM?       X 2 days ago  2. STRAINING: Do you have to strain to have a BM?      Yes  3. RECTAL PAIN: Does your rectum hurt when the stool comes out? If Yes, ask: Do you have hemorrhoids? How bad is the pain?  (Scale 1-10; or mild, moderate, severe)     Yes  4. STOOL COMPOSITION: Are the stools hard?      Yes  5. BLOOD ON STOOLS: Has there been any blood on the toilet tissue or on the surface of the BM? If Yes, ask: When was the last time?     No   6. CHRONIC CONSTIPATION: Is this a new problem for you?  If No, ask: How long have you had this problem? (days, weeks, months)      Yes  7. CHANGES IN DIET OR HYDRATION: Have there been any recent changes in your diet? How much fluids are you drinking on a daily basis?  How much have you had to drink today?     No  8. MEDICINES: Have you been  taking any new medicines? Are you taking any narcotic pain medicines? (e.g., Dilaudid, morphine , Percocet, Vicodin)     650 mg of Tylenol  for Arthritis   9. LAXATIVES: Have you been using any stool softeners, laxatives, or enemas?  If Yes, ask What, how often, and when was the last time?     Suppository, and herbal tea  10. ACTIVITY:  How much walking do you do every day?  Has your activity level decreased in the past week?        No  11. CAUSE: What do you think is causing the constipation?        Unknown   12. OTHER SYMPTOMS: Do you have any other symptoms? (e.g., abdomen pain, bloating, fever, vomiting)    Abdominal and gas pain  13. MEDICAL HISTORY: Do you have a history of hemorrhoids, rectal fissures, or rectal surgery or rectal abscess?        No    She took a suppository this morning, some hard pellets came out. She feels impacted.  Protocols used: Constipation-A-AH

## 2024-04-06 NOTE — Patient Instructions (Signed)
 We discussed that there are other treatments for chronic constipation that might be more effective than the Miralax  has been.  These include amitiza, linzess and others.  We gave you samples of 72 mcg and 145 mcg of the Linzess. Start with the lower dose (72 mcg), taken 30 minutes before a meal, once a day. If that isn't effective, then move up to the 145 mcg dose.  Let us  know which dose seems most effective, and we can send in a prescription for this (to see if it is even covered, or if we will have to try a different medication). If neither dose is helping with the constipation, we can try the higher dose.  Let us  know, and we should be able to give you samples to try.  You can stop using the Miralax , and use the Linzess instead.  We discussed the need to drink more water (the aim is to have very pale yellow urine--it if is dark yellow, you need to be drinking more water).  We also discussed taking a fiber supplement daily such as Benefiber or Metamucil (using the powder in a tall glass of liquid, rather than any pills or wafers).  Follow up with Dr. Joyce in a couple of weeks if you aren't doing any better.

## 2024-04-06 NOTE — Progress Notes (Signed)
 Chief Complaint  Patient presents with   Acute Visit    Constipation. Feels like she's impacted. Trying to come out but its coming out bigger than rectum looks like intestines are coming out with it. Saturday had a small bowl movement but been going on 7 days prior.    She has chronic constipation, takes Miralax  daily.  She has been straining, stools are hard, and notes some abdominal and gas pain. Last BM was Friday, only small pellets.  Prior to that, last BM was 2 weeks.  She used a Fleet's suppository this morning after straining and unable to pass anything.. Daughter came home, and saw that a large caliber stool was lodged/stuck, unable to exit the rectum due to its size.  She used a mineral oil enema, though noted she couldn't even get half of it in.  Felt like things were blocked.  Still very painful. No stool has exited.   She uses Miralax  daily. Eats a high fiber diet. She reports she drinks plenty of water. Sometimes the urine is dark yellow.    PMH, PSH, SH reviewed HTN, DM, HLD, PAD  Outpatient Encounter Medications as of 04/06/2024  Medication Sig Note   albuterol  (VENTOLIN  HFA) 108 (90 Base) MCG/ACT inhaler Inhale 1-2 puffs into the lungs every 4 (four) hours as needed for wheezing or shortness of breath. 06/12/2023: As needed   aspirin  EC 81 MG tablet Take 81 mg by mouth every evening.    Calcium Carb-Cholecalciferol (CALCIUM 600+D3 PO) Take 1 tablet by mouth daily.    Chlorpheniramine-DM (CORICIDIN HBP COUGH/COLD PO) Take 1 tablet by mouth every 8 (eight) hours as needed (for cold symptoms). 06/12/2023: As needed   losartan -hydrochlorothiazide  (HYZAAR) 50-12.5 MG tablet Take 1 tablet by mouth daily.    meclizine  (ANTIVERT ) 12.5 MG tablet TAKE 1 TABLET BY MOUTH THREE TIMES DAILY AS NEEDED FOR DIZZINESS 06/12/2023: As needed   polyethylene glycol (MIRALAX  / GLYCOLAX ) 17 g packet Take 17 g by mouth daily.    simvastatin  (ZOCOR ) 40 MG tablet Take 1 tablet (40 mg total) by mouth  daily.    No facility-administered encounter medications on file as of 04/06/2024.   Allergies  Allergen Reactions   Tramadol  Nausea And Vomiting    ROS: no fever, chills. R side is always cold, poor circulation. No HA or dizziness, CP or shortness of breath, no edema. No nausea or vomiting.   Decreased appetite, bloating, lower abdominal pain per HPI. No blood noted in the stool today. No dysuria, hematuria. See HPI     PHYSICAL EXAM:  BP 130/80   Pulse 80   SpO2 99%   Pleasant elderly female, accompanied by her daughter. She is alert and oriented, and in mild discomfort related to rectal pressure, and some abdominal pressure. HEENT: EOMI, mouth seems mildly dry. Heart: regular rate and rhythm Lungs: clear Abdomen: soft.  Mild epigastric tenderness, and mild tenderness at RLQ. No mass, rebound or guarding. Mild tenderness, slight nodular feeling within scar tissue of midline c/s scar Psych: normal mood, affect, grooming Neuro: alert and oriented, normal strength.  Gait not assessed, other than transfer from table to wheelchair. Skin: normal turgor. Rectal:  externally--some mild soft tissue puffiness, but nontender (where daughter states was full and hard earlier). Tag noted, not particularly inflamed. No hemorrhoids noted. Very large caliber stool present inside rectum. The edges are very hard and too deep for this examiner to get around. The tip of the stool is quite soft. I worked to break  up the large hard stool, unable to get behind the whole thing to pull it forward. Patient had moderate discomfort during this procedure. We took a break, and had her go to the restroom to see if she could pass anything per rectum on her own. Stool on glove was light brown, fairly soft.  She passed a very large, wide caliber stool while in the bathroom. She reports feeling much better.  No longer has stomach or rectal discomfort.  No blood noted in the toilet. Abdomen was soft and  nontender on repeat exam.    ASSESSMENT/PLAN:  Fecal impaction (HCC) - disimpacted with resolution of abdominal and rectal discomfort  Chronic constipation - Switch from Miralax  to Linzess. Given #4 of 72 mcg, #8 of 145 mcg.  If neither are effective, contact us  for samples of 290 mcg.  Increase water; fiber intake - Plan: linaclotide (LINZESS) 72 MCG capsule, linaclotide (LINZESS) 145 MCG CAPS capsule   Counseled on water intake (and goals). Recommended fiber supplement such as benefiber. Stop Miralax  Trial of Linzess. Contact us  for rx for effective dose, or for samples of 290mcg if neither are effective, but are well tolerated.  F/u with Dr. Joyce in the next couple of weeks

## 2024-04-06 NOTE — Telephone Encounter (Deleted)
 Copied from CRM [id number]. Topic: Clinical - Medical Advice >> Apr 06, 2024  2:36 PM Wess RAMAN wrote: Reason for CRM: Patient would like to know if she should go to urgent care or the emergency room due to constipation. Pain level is 5-6  Callback #: [phone]

## 2024-04-17 ENCOUNTER — Telehealth: Payer: Self-pay | Admitting: Family Medicine

## 2024-04-17 NOTE — Telephone Encounter (Unsigned)
 Copied from CRM [id number]. Topic: Clinical - Medication Refill >> Apr 17, 2024  1:18 PM Fredrica W wrote: Medication:  linaclotide  (LINZESS ) - States she would like to move up to next dose. Last dose was 145 - just worked.    Has the patient contacted their pharmacy? No - would like increase  (Agent: If no, request that the patient contact the pharmacy for the refill. If patient does not wish to contact the pharmacy document the reason why and proceed with request.) (Agent: If yes, when and what did the pharmacy advise?)  This is the patient's preferred pharmacy:  She said she would pick up  Is this the correct pharmacy for this prescription? No If no, delete pharmacy and type the correct one.   Has the prescription been filled recently? Yes  Is the patient out of the medication? Yes  Has the patient been seen for an appointment in the last year OR does the patient have an upcoming appointment? Yes  Can we respond through MyChart? No  Agent: Please be advised that Rx refills may take up to 3 business days. We ask that you follow-up with your pharmacy.   ----------------------------------------------------------------------- From previous Reason for Contact - Cancel/Reschedule: Patient/patient representative is calling to cancel or reschedule an appointment. Refer to attachments for appointment information.

## 2024-04-20 ENCOUNTER — Ambulatory Visit: Admitting: Family Medicine

## 2024-04-20 MED ORDER — LINACLOTIDE 290 MCG PO CAPS: 290.0000 ug | ORAL_CAPSULE | Freq: Every day | ORAL | 1 refills | Status: DC

## 2024-05-15 DIAGNOSIS — Z961 Presence of intraocular lens: Secondary | ICD-10-CM | POA: Diagnosis not present

## 2024-05-15 DIAGNOSIS — E119 Type 2 diabetes mellitus without complications: Secondary | ICD-10-CM | POA: Diagnosis not present

## 2024-05-15 DIAGNOSIS — I10 Essential (primary) hypertension: Secondary | ICD-10-CM | POA: Diagnosis not present

## 2024-05-15 DIAGNOSIS — H16223 Keratoconjunctivitis sicca, not specified as Sjogren's, bilateral: Secondary | ICD-10-CM | POA: Diagnosis not present

## 2024-08-04 ENCOUNTER — Ambulatory Visit: Payer: Self-pay

## 2024-08-04 NOTE — Telephone Encounter (Signed)
 Patient called to report a 15 minute episode of chest pain and pressure. Patient states symptoms started at 3:45 PM and was finished at 4 PM. Patient denies any current symptoms of CP, SOB, nausea or palpitations. Patient is scheduled to see PCP tomorrow at 9 AM.   FYI Only or Action Required?: FYI only for provider.  Patient was last seen in primary care on 04/06/2024 by Randol Dawes, MD.  Called Nurse Triage reporting Chest Pain.  Symptoms began today.  Interventions attempted: Rest, hydration, or home remedies.  Symptoms are: completely resolved.  Triage Disposition: See Physician Within 24 Hours  Patient/caregiver understands and will follow disposition?: Yes  Copied from CRM [id number]. Topic: Clinical - Red Word Triage >> Aug 04, 2024  4:25 PM Kevelyn M wrote: Red Word that prompted transfer to Nurse Triage: Pressure in chest, pressure on heart. 30 minutes ago. Reason for Disposition  [1] Chest pain lasts > 5 minutes AND [2] occurred > 3 days ago (72 hours) AND [3] NO chest pain or cardiac symptoms now  Answer Assessment - Initial Assessment Questions 1. LOCATION: Where does it hurt?       Center of chest  2. RADIATION: Does the pain go anywhere else? (e.g., into neck, jaw, arms, back)     Did radiate into the neck  3. ONSET: When did the chest pain begin? (Minutes, hours or days)      3:45 PM 4. PATTERN: Does the pain come and go, or has it been constant since it started?  Does it get worse with exertion?     Pain isn't present  5. DURATION: How long does it last (e.g., seconds, minutes, hours)     15 minutes 6. SEVERITY: How bad is the pain?  (e.g., Scale 1-10; mild, moderate, or severe)     When pain occurred 5 out of 10. No pain currently  7. CARDIAC RISK FACTORS: Do you have any history of heart problems or risk factors for heart disease? (e.g., angina, prior heart attack; diabetes, high blood pressure, high cholesterol, smoker, or strong family history of  heart disease)     High blood pressure, high cholesterol, DM 8. PULMONARY RISK FACTORS: Do you have any history of lung disease?  (e.g., blood clots in lung, asthma, emphysema, birth control pills)     no 9. CAUSE: What do you think is causing the chest pain?     unsure 10. OTHER SYMPTOMS: Do you have any other symptoms? (e.g., dizziness, nausea, vomiting, sweating, fever, difficulty breathing, cough)       no  Protocols used: Chest Pain-A-AH

## 2024-08-05 ENCOUNTER — Ambulatory Visit: Admitting: Family Medicine

## 2024-08-05 ENCOUNTER — Encounter: Payer: Self-pay | Admitting: Family Medicine

## 2024-08-05 VITALS — BP 134/70 | HR 68 | Ht 64.25 in | Wt 198.0 lb

## 2024-08-05 DIAGNOSIS — Z23 Encounter for immunization: Secondary | ICD-10-CM

## 2024-08-05 DIAGNOSIS — R079 Chest pain, unspecified: Secondary | ICD-10-CM

## 2024-08-05 DIAGNOSIS — R42 Dizziness and giddiness: Secondary | ICD-10-CM | POA: Diagnosis not present

## 2024-08-05 MED ORDER — MECLIZINE HCL 12.5 MG PO TABS
ORAL_TABLET | ORAL | 0 refills | Status: AC
Start: 2024-08-05 — End: ?

## 2024-08-05 NOTE — Telephone Encounter (Signed)
 Patient was scheduled for today @ 9am

## 2024-08-05 NOTE — Progress Notes (Signed)
   Subjective:    Patient ID: Phyllis Campbell, female    DOB: 09-Apr-1932, 89 y.o.   MRN: [id number]  Discussed the use of AI scribe software for clinical note transcription with the patient, who gave verbal consent to proceed.  History of Present Illness   Phyllis Campbell is a 89 year old female who presents with chest pain.  She experienced chest pain that began yesterday at approximately 3:00 PM while driving home after picking up her grandchildren from school. The pain was described as a pressure-like sensation, lasting for about 30 minutes before partially subsiding, and completely resolving by 6:00 PM. During the episode, she experienced shortness of breath but no diaphoresis. Drinking cold water provided some relief, and there has been no recurrence of chest pain since last night.  Last night, she felt nauseous, and this sensation persisted into the morning. She has been using ondansetron  for nausea, which she places under her tongue. She has not eaten today due to lack of appetite. She occasionally experiences acid indigestion and takes Pepcid for relief.  She also reports experiencing vertigo and headaches, described as a 'swimming' sensation in her head. This is a chronic issue, and she is currently out of medication for it.           Review of Systems     Objective:    Physical Exam Alert and in no distress.  Cardiac exam shows regular rhythm without murmurs or gallops.  EKG read by me shows a rate of 40 but otherwise normal EKG.              Assessment & Plan:  Assessment and Plan    Nausea Nausea since last night, possibly related to gastroesophageal reflux disease. - Continue ondansetron  as needed. - Advise use of liquid antacid. - Instruct to avoid taking Pepcid and liquid antacid simultaneously.  Gastroesophageal reflux disease without esophagitis Intermittent acid indigestion managed with Pepcid, possibly contributing to nausea. - Continue Pepcid as  needed.  Vertigo Chronic vertigo. - Call in prescription for vertigo medication.     Also instructed her to call the ambulance the next time she has this type of chest pain you know I think is probably GI in origin.

## 2024-08-13 ENCOUNTER — Telehealth: Payer: Self-pay

## 2024-08-13 NOTE — Telephone Encounter (Signed)
 Patient was seen by Dr. Randol on 04/06/24, she called in  and wants to know if you would order a colonoscopy? Does she need a new appt. Has been having ongoing issues with constipation.     Copied from CRM [id number]. Topic: Referral - Request for Referral >> Aug 13, 2024  1:09 PM Hadassah PARAS wrote: Did the patient discuss referral with their provider in the last year? No (If No - schedule appointment) (If Yes - send message)  Appointment offered? No  Type of order/referral and detailed reason for visit: Constipation and abdominal pain   Preference of office, provider, location: [place]; would like to know if she could have a colonoscopy performed   If referral order, have you been seen by this specialty before? No (If Yes, this issue or another issue? When? Where?  Can we respond through MyChart? No, please advise #[phone]

## 2024-08-13 NOTE — Telephone Encounter (Signed)
 Patient scheduled.

## 2024-08-13 NOTE — Telephone Encounter (Signed)
 Called Patient to schedule appt and unable to leave message.

## 2024-08-18 ENCOUNTER — Ambulatory Visit: Admitting: Family Medicine

## 2024-08-18 ENCOUNTER — Encounter: Payer: Self-pay | Admitting: Family Medicine

## 2024-08-18 VITALS — BP 138/70 | HR 64 | Ht 64.25 in | Wt 195.4 lb

## 2024-08-18 DIAGNOSIS — K5909 Other constipation: Secondary | ICD-10-CM

## 2024-08-18 MED ORDER — LINACLOTIDE 290 MCG PO CAPS: 290.0000 ug | ORAL_CAPSULE | Freq: Every day | ORAL | 3 refills | Status: AC

## 2024-08-18 NOTE — Progress Notes (Signed)
   Subjective:    Patient ID: Phyllis Campbell, female    DOB: 24-Sep-1932, 89 y.o.   MRN: [id number]  Discussed the use of AI scribe software for clinical note transcription with the patient, who gave verbal consent to proceed.  History of Present Illness   Phyllis Campbell is a 89 year old female who presents for management of chronic constipation.  She has experienced chronic constipation for many years, often describing it as 'impactive', necessitating the use of enemas for relief. The constipation causes significant discomfort, including pain and difficulty walking.  She has been using Miralax  regularly until about a month ago when she was prescribed Linzess . Initially, she received samples of both the lowest and a higher dosage of Linzess  but found she needed to take additional stool softeners and laxatives like Ex-Lax to achieve relief.  Linzess  has been somewhat effective, but she is running out of the medication. She has not had a colonoscopy in about twenty years, which was the last time she had a regular bowel movement without medication.  She is concerned about the cost of Linzess  and the inconvenience of frequent pharmacy visits.           Review of Systems     Objective:    Physical Exam Alert and in no distress otherwise not examined             Assessment & Plan:     Chronic constipation Managed with Linzess . Colonoscopy not recommended due to age and effective symptom control. Cancer unlikely. - Continue Linzess  290 mcg daily. - Continue Miralax  with adequate hydration. - Called in 90-day supply of Linzess .

## 2024-08-26 ENCOUNTER — Ambulatory Visit: Payer: Self-pay | Admitting: Family Medicine

## 2024-08-26 ENCOUNTER — Ambulatory Visit: Admitting: Family Medicine

## 2024-08-26 ENCOUNTER — Encounter: Payer: Self-pay | Admitting: Family Medicine

## 2024-08-26 ENCOUNTER — Ambulatory Visit: Payer: Self-pay

## 2024-08-26 ENCOUNTER — Ambulatory Visit
Admission: RE | Admit: 2024-08-26 | Discharge: 2024-08-26 | Disposition: A | Source: Ambulatory Visit | Attending: Family Medicine | Admitting: Family Medicine

## 2024-08-26 VITALS — BP 140/72 | HR 66 | Ht 64.25 in | Wt 195.0 lb

## 2024-08-26 DIAGNOSIS — M25561 Pain in right knee: Secondary | ICD-10-CM | POA: Diagnosis not present

## 2024-08-26 DIAGNOSIS — M25461 Effusion, right knee: Secondary | ICD-10-CM

## 2024-08-26 DIAGNOSIS — M1711 Unilateral primary osteoarthritis, right knee: Secondary | ICD-10-CM | POA: Diagnosis not present

## 2024-08-26 NOTE — Progress Notes (Signed)
   Subjective:    Patient ID: Phyllis Campbell, female    DOB: 04-Aug-1932, 89 y.o.   MRN: [id number]  Discussed the use of AI scribe software for clinical note transcription with the patient, who gave verbal consent to proceed.  History of Present Illness   Phyllis Campbell is a 89 year old female who presents with acute right leg pain and inability to bear weight.  On Monday, she experienced a sharp pain in her right leg while attempting to stand and walk, leading to an inability to bear weight on the leg. The pain is described as sharp.  On Tuesday, she fell three times due to the severity of the pain. She notes swelling in the affected leg, as confirmed by her daughter.  She experiences no pain while lying in bed, and the pain is only present when she attempts to stand or walk.           Review of Systems     Objective:    Physical Exam Right knee exam shows an effusion.  No palpable tenderness or erythema .ligaments intact.  Fairly good motion of the knee with minimal discomfort passively but more discomfort with active motion of the leg.      Assessment & Plan:     Acute right knee pain with effusion Acute right knee pain with swelling and tenderness, exacerbated by weight-bearing. - Advised rest and avoidance of weight-bearing activities. - Recommended relative rest and the use of Tylenol .  X-rays will be ordered and she will return here tomorrow.  Hopefully this will show arthritic changes and if so I will take some fluid out and also give some steroid.

## 2024-08-26 NOTE — Progress Notes (Signed)
 Called Patient to let her know what the plan is and unable to leave a message.

## 2024-08-26 NOTE — Telephone Encounter (Signed)
 FYI Only or Action Required?: FYI only for provider: appointment scheduled on 08/26/2024 at 11 AM.  Patient was last seen in primary care on 08/18/2024 by Joyce Norleen BROCKS, MD.  Called Nurse Triage reporting Knee Pain.  Symptoms began Monday.  Interventions attempted: Rest, hydration, or home remedies.  Symptoms are: unchanged.  Triage Disposition: See HCP Within 4 Hours (Or PCP Triage)  Patient/caregiver understands and will follow disposition?: Yes  Copied from CRM #[id number]. Topic: Clinical - Red Word Triage >> Aug 26, 2024  8:38 AM Myrick T wrote: Red Word that prompted transfer to Nurse Triage: patient says on Monday her right knee started hurting. It is swollen, warm to the touch and she can not put pressure on it to walk. Reason for Disposition  [1] SEVERE pain (e.g., excruciating, unable to walk) AND [2] not improved after 2 hours of pain medicine  Answer Assessment - Initial Assessment Questions Patient reports right knee pain that is sharp in nature since Monday. Patient states pain is severe when she stands to put pressure on her knee. Patient reports swelling to the knee along with warmth to the knee as well.   1. LOCATION and RADIATION: Where is the pain located?      Right knee pain 2. QUALITY: What does the pain feel like?  (e.g., sharp, dull, aching, burning)     Sharp  3. SEVERITY: How bad is the pain? What does it keep you from doing?   (Scale 1-10; or mild, moderate, severe)     5 currently but when she stands up and put pressure on the knee, pain goes to a 10 4. ONSET: When did the pain start? Does it come and go, or is it there all the time?     Monday 5. RECURRENT: Have you had this pain before? If Yes, ask: When, and what happened then?     yes 6. SETTING: Has there been any recent work, exercise or other activity that involved that part of the body?      no 7. AGGRAVATING FACTORS: What makes the knee pain worse? (e.g., walking, climbing  stairs, running)     Standing causes the knee to hurt 8. ASSOCIATED SYMPTOMS: Is there any swelling or redness of the knee?     Swelling and warmth to the knee 9. OTHER SYMPTOMS: Do you have any other symptoms? (e.g., calf pain, chest pain, difficulty breathing, fever)     no  Protocols used: Knee Pain-A-AH

## 2024-08-27 ENCOUNTER — Ambulatory Visit: Admitting: Family Medicine

## 2024-08-27 ENCOUNTER — Encounter: Payer: Self-pay | Admitting: Family Medicine

## 2024-08-27 VITALS — BP 130/72 | HR 71

## 2024-08-27 DIAGNOSIS — M25461 Effusion, right knee: Secondary | ICD-10-CM | POA: Diagnosis not present

## 2024-08-27 DIAGNOSIS — M1710 Unilateral primary osteoarthritis, unspecified knee: Secondary | ICD-10-CM

## 2024-08-27 NOTE — Progress Notes (Signed)
   Subjective:    Patient ID: Phyllis Campbell, female    DOB: 03-30-1932, 89 y.o.   MRN: [id number]  HPI She is here for aspiration and injection.  She had recent x-rays that did show an effusion as well as some arthritic changes in her right knee.   Review of Systems     Objective:    Physical Exam Exam of the right knee does show an effusion present.       Assessment & Plan:  Effusion of right knee  Arthritis of knee The right lateral knee was prepped with Betadine superior lateral aspect of the patella was identified.  The area was injected with Xylocaine .  Approximately 10 cc of yellow fluid was removed before the discomfort caused me to stop.  I then injected 3 cc of Xylocaine  and 1 cc of Kenalog  without difficulty.  Within several months she was experiencing no pain in her knee and was very happy with the procedure.  Cautioned about steroid flare and explained that hopefully this will last a significant period of time but difficult to say how long.  She will keep us  informed as to how she is doing.

## 2024-12-17 ENCOUNTER — Ambulatory Visit: Payer: Medicare PPO | Admitting: Family Medicine
